# Patient Record
Sex: Female | Born: 1990 | Race: White | Hispanic: No | Smoking: Current every day smoker
Health system: Southern US, Community
[De-identification: ages and names within clinical notes are randomized; demographics above are authoritative.]

## PROBLEM LIST (undated history)

## (undated) ENCOUNTER — Inpatient Hospital Stay (HOSPITAL_COMMUNITY): Payer: Self-pay

## (undated) DIAGNOSIS — F1910 Other psychoactive substance abuse, uncomplicated: Secondary | ICD-10-CM

## (undated) DIAGNOSIS — B999 Unspecified infectious disease: Secondary | ICD-10-CM

## (undated) DIAGNOSIS — B1920 Unspecified viral hepatitis C without hepatic coma: Secondary | ICD-10-CM

## (undated) DIAGNOSIS — R51 Headache: Secondary | ICD-10-CM

## (undated) DIAGNOSIS — O24419 Gestational diabetes mellitus in pregnancy, unspecified control: Secondary | ICD-10-CM

## (undated) DIAGNOSIS — R519 Headache, unspecified: Secondary | ICD-10-CM

## (undated) DIAGNOSIS — F329 Major depressive disorder, single episode, unspecified: Secondary | ICD-10-CM

## (undated) DIAGNOSIS — F32A Depression, unspecified: Secondary | ICD-10-CM

## (undated) HISTORY — DX: Unspecified viral hepatitis C without hepatic coma: B19.20

## (undated) HISTORY — DX: Headache, unspecified: R51.9

## (undated) HISTORY — DX: Gestational diabetes mellitus in pregnancy, unspecified control: O24.419

## (undated) HISTORY — DX: Headache: R51

---

## 2012-07-23 HISTORY — PX: CHOLECYSTECTOMY, LAPAROSCOPIC: SHX56

## 2016-01-17 ENCOUNTER — Inpatient Hospital Stay
Admission: RE | Admit: 2016-01-17 | Discharge: 2016-01-23 | DRG: 885 | Disposition: A | Discharge status: Home / Self Care | Payer: Medicaid Other | Source: Intra-hospital | Attending: Psychiatry | Admitting: Psychiatry

## 2016-01-17 ENCOUNTER — Inpatient Hospital Stay
Admission: EM | Admit: 2016-01-17 | Payer: No Typology Code available for payment source | Source: Intra-hospital | Admitting: Psychiatry

## 2016-01-17 ENCOUNTER — Emergency Department (HOSPITAL_COMMUNITY)
Admission: EM | Admit: 2016-01-17 | Discharge: 2016-01-17 | Disposition: A | Discharge status: Other Acute Inpatient Hospital | Payer: Medicaid Other | Attending: Emergency Medicine | Admitting: Emergency Medicine

## 2016-01-17 ENCOUNTER — Encounter (HOSPITAL_COMMUNITY): Payer: Self-pay

## 2016-01-17 DIAGNOSIS — S00511A Abrasion of lip, initial encounter: Secondary | ICD-10-CM | POA: Diagnosis not present

## 2016-01-17 DIAGNOSIS — Z590 Homelessness: Secondary | ICD-10-CM | POA: Diagnosis not present

## 2016-01-17 DIAGNOSIS — Y939 Activity, unspecified: Secondary | ICD-10-CM | POA: Diagnosis not present

## 2016-01-17 DIAGNOSIS — O99341 Other mental disorders complicating pregnancy, first trimester: Secondary | ICD-10-CM | POA: Diagnosis present

## 2016-01-17 DIAGNOSIS — F1290 Cannabis use, unspecified, uncomplicated: Secondary | ICD-10-CM | POA: Diagnosis present

## 2016-01-17 DIAGNOSIS — O99331 Smoking (tobacco) complicating pregnancy, first trimester: Secondary | ICD-10-CM | POA: Insufficient documentation

## 2016-01-17 DIAGNOSIS — F329 Major depressive disorder, single episode, unspecified: Secondary | ICD-10-CM | POA: Insufficient documentation

## 2016-01-17 DIAGNOSIS — S5011XA Contusion of right forearm, initial encounter: Secondary | ICD-10-CM | POA: Diagnosis not present

## 2016-01-17 DIAGNOSIS — S5012XA Contusion of left forearm, initial encounter: Secondary | ICD-10-CM | POA: Diagnosis not present

## 2016-01-17 DIAGNOSIS — O2341 Unspecified infection of urinary tract in pregnancy, first trimester: Secondary | ICD-10-CM | POA: Diagnosis present

## 2016-01-17 DIAGNOSIS — O99321 Drug use complicating pregnancy, first trimester: Secondary | ICD-10-CM | POA: Diagnosis present

## 2016-01-17 DIAGNOSIS — T43226A Underdosing of selective serotonin reuptake inhibitors, initial encounter: Secondary | ICD-10-CM | POA: Diagnosis present

## 2016-01-17 DIAGNOSIS — F1310 Sedative, hypnotic or anxiolytic abuse, uncomplicated: Secondary | ICD-10-CM | POA: Diagnosis present

## 2016-01-17 DIAGNOSIS — G4700 Insomnia, unspecified: Secondary | ICD-10-CM | POA: Diagnosis present

## 2016-01-17 DIAGNOSIS — O0001 Abdominal pregnancy with intrauterine pregnancy: Secondary | ICD-10-CM | POA: Diagnosis present

## 2016-01-17 DIAGNOSIS — Z91120 Patient's intentional underdosing of medication regimen due to financial hardship: Secondary | ICD-10-CM | POA: Diagnosis not present

## 2016-01-17 DIAGNOSIS — R45851 Suicidal ideations: Secondary | ICD-10-CM | POA: Insufficient documentation

## 2016-01-17 DIAGNOSIS — F332 Major depressive disorder, recurrent severe without psychotic features: Principal | ICD-10-CM | POA: Diagnosis present

## 2016-01-17 DIAGNOSIS — Z79899 Other long term (current) drug therapy: Secondary | ICD-10-CM | POA: Insufficient documentation

## 2016-01-17 DIAGNOSIS — F17210 Nicotine dependence, cigarettes, uncomplicated: Secondary | ICD-10-CM | POA: Insufficient documentation

## 2016-01-17 DIAGNOSIS — Y92009 Unspecified place in unspecified non-institutional (private) residence as the place of occurrence of the external cause: Secondary | ICD-10-CM | POA: Insufficient documentation

## 2016-01-17 DIAGNOSIS — F1121 Opioid dependence, in remission: Secondary | ICD-10-CM | POA: Diagnosis present

## 2016-01-17 DIAGNOSIS — Y999 Unspecified external cause status: Secondary | ICD-10-CM | POA: Insufficient documentation

## 2016-01-17 DIAGNOSIS — B852 Pediculosis, unspecified: Secondary | ICD-10-CM | POA: Diagnosis present

## 2016-01-17 DIAGNOSIS — O9A311 Physical abuse complicating pregnancy, first trimester: Secondary | ICD-10-CM | POA: Insufficient documentation

## 2016-01-17 DIAGNOSIS — Z3A11 11 weeks gestation of pregnancy: Secondary | ICD-10-CM | POA: Insufficient documentation

## 2016-01-17 DIAGNOSIS — F17200 Nicotine dependence, unspecified, uncomplicated: Secondary | ICD-10-CM | POA: Diagnosis present

## 2016-01-17 DIAGNOSIS — F1220 Cannabis dependence, uncomplicated: Secondary | ICD-10-CM | POA: Diagnosis present

## 2016-01-17 DIAGNOSIS — F32A Depression, unspecified: Secondary | ICD-10-CM

## 2016-01-17 HISTORY — DX: Major depressive disorder, single episode, unspecified: F32.9

## 2016-01-17 HISTORY — DX: Depression, unspecified: F32.A

## 2016-01-17 LAB — CBC
HCT: 40.6 % (ref 36.0–46.0)
HEMOGLOBIN: 13.9 g/dL (ref 12.0–15.0)
MCH: 30.6 pg (ref 26.0–34.0)
MCHC: 34.2 g/dL (ref 30.0–36.0)
MCV: 89.4 fL (ref 78.0–100.0)
Platelets: 218 10*3/uL (ref 150–400)
RBC: 4.54 MIL/uL (ref 3.87–5.11)
RDW: 12 % (ref 11.5–15.5)
WBC: 7.5 10*3/uL (ref 4.0–10.5)

## 2016-01-17 LAB — COMPREHENSIVE METABOLIC PANEL
ALK PHOS: 45 U/L (ref 38–126)
ALT: 124 U/L — ABNORMAL HIGH (ref 14–54)
AST: 56 U/L — AB (ref 15–41)
Albumin: 3.6 g/dL (ref 3.5–5.0)
Anion gap: 9 (ref 5–15)
BUN: 6 mg/dL (ref 6–20)
CALCIUM: 9.8 mg/dL (ref 8.9–10.3)
CHLORIDE: 105 mmol/L (ref 101–111)
CO2: 21 mmol/L — AB (ref 22–32)
CREATININE: 0.71 mg/dL (ref 0.44–1.00)
Glucose, Bld: 170 mg/dL — ABNORMAL HIGH (ref 65–99)
Potassium: 3.5 mmol/L (ref 3.5–5.1)
SODIUM: 135 mmol/L (ref 135–145)
Total Bilirubin: 0.3 mg/dL (ref 0.3–1.2)
Total Protein: 6.7 g/dL (ref 6.5–8.1)

## 2016-01-17 LAB — RAPID URINE DRUG SCREEN, HOSP PERFORMED
AMPHETAMINES: NOT DETECTED
BARBITURATES: POSITIVE — AB
Benzodiazepines: NOT DETECTED
Cocaine: NOT DETECTED
OPIATES: NOT DETECTED
TETRAHYDROCANNABINOL: POSITIVE — AB

## 2016-01-17 LAB — SALICYLATE LEVEL

## 2016-01-17 LAB — HCG, QUANTITATIVE, PREGNANCY: HCG, BETA CHAIN, QUANT, S: 74542 m[IU]/mL — AB (ref <5)

## 2016-01-17 LAB — ACETAMINOPHEN LEVEL: Acetaminophen (Tylenol), Serum: <10 ug/mL — ABNORMAL LOW (ref 10–30)

## 2016-01-17 LAB — I-STAT BETA HCG BLOOD, ED (MC, WL, AP ONLY)

## 2016-01-17 LAB — ETHANOL: Alcohol, Ethyl (B): <5 mg/dL (ref <5)

## 2016-01-17 MED ORDER — MAGNESIUM HYDROXIDE 400 MG/5ML PO SUSP: 30.0000 mL | Freq: Every day | ORAL | Status: DC | PRN

## 2016-01-17 MED ORDER — PRENATAL MULTIVITAMIN CH
1.0000 | ORAL_TABLET | Freq: Every day | ORAL | Status: DC
  Administered 2016-01-18 – 2016-01-23 (×6): 1 via ORAL
  Filled 2016-01-17 (×11): qty 1

## 2016-01-17 MED ORDER — DIPHENHYDRAMINE HCL 25 MG PO CAPS
50.0000 mg | ORAL_CAPSULE | Freq: Every evening | ORAL | Status: DC | PRN
  Administered 2016-01-20 – 2016-01-22 (×2): 50 mg via ORAL
  Filled 2016-01-17 (×2): qty 2

## 2016-01-17 MED ORDER — ACETAMINOPHEN 325 MG PO TABS
650.0000 mg | ORAL_TABLET | Freq: Four times a day (QID) | ORAL | Status: DC | PRN
Start: 2016-01-17 — End: 2016-01-23
  Administered 2016-01-18 – 2016-01-22 (×4): 650 mg via ORAL
  Filled 2016-01-17 (×4): qty 2

## 2016-01-17 MED ORDER — NICOTINE 21 MG/24HR TD PT24
21.0000 mg | MEDICATED_PATCH | Freq: Every day | TRANSDERMAL | Status: DC
  Administered 2016-01-17 – 2016-01-21 (×5): 21 mg via TRANSDERMAL
  Filled 2016-01-17 (×5): qty 1

## 2016-01-17 MED ORDER — PRENATAL 27-0.8 MG PO TABS
1.0000 | ORAL_TABLET | Freq: Every day | ORAL | Status: DC
  Administered 2016-01-17: 1 via ORAL
  Filled 2016-01-17: qty 1

## 2016-01-17 MED ORDER — ALUM & MAG HYDROXIDE-SIMETH 200-200-20 MG/5ML PO SUSP: 30.0000 mL | ORAL | Status: DC | PRN

## 2016-01-17 MED ORDER — ONDANSETRON HCL 4 MG PO TABS: 4.0000 mg | ORAL_TABLET | Freq: Two times a day (BID) | ORAL | Status: DC | PRN

## 2016-01-17 MED ORDER — ACETAMINOPHEN 325 MG PO TABS: 650.0000 mg | ORAL_TABLET | Freq: Four times a day (QID) | ORAL | Status: DC | PRN

## 2016-01-17 NOTE — ED Notes (Signed)
[facility] contacted to transport patient to [facility]

## 2016-01-17 NOTE — ED Provider Notes (Signed)
25yo female with hx of [redacted]wk pregnant who present with SI. Medically cleared. Pt accepted to [facility] for continued care. Stable prior to time of transfer.   Alvira MondayErin Madalyn Legner, MD 01/19/16 [phone]
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
## 2016-01-17 NOTE — Progress Notes (Signed)
Pt referred [facility] per Virgina OrganLaurie Gardener, intake coordinator. Also [facility] and [facility] upon appropriate bed availability.  Ilean SkillMeghan Lailyn Appelbaum, MSW, LCSW Clinical Social Work, Disposition  01/17/2016 [id number]
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
## 2016-01-17 NOTE — Progress Notes (Signed)
Pt being reviewed for possible [facility]. H&P and Assessment [facility] for the charge nurse to review .     Cheryl FlashNicole Jailyne Chieffo, MS, NCC, LPCA Therapeutic Triage Specialist

## 2016-01-17 NOTE — Progress Notes (Signed)
[facility]  Attending Physician will be Dr. Ardyth HarpsHernandez.   Patient has been [facility] to room 301, [facility] Charge Nurse Gwen.   Intake Paper Work has been signed and placed on patient chart.  ER staff is aware of the admission ( Patient Access).   Please transport after 7pm  01/17/2016 Cheryl FlashNicole Dalan Cowger, MS, NCC, LPCA Therapeutic Triage Specialist

## 2016-01-17 NOTE — ED Notes (Signed)
Ordered diet tray 

## 2016-01-17 NOTE — Progress Notes (Signed)
Pt accepted to [facility]- attending Dr. Ardyth HarpsHernandez. Can arrive 19:00. Report can be called at [phone], and voluntary consents once signed by pt can be faxed to [facility] at [phone].  Ilean SkillMeghan Hillis Mcphatter, MSW, LCSW Clinical Social Work, Disposition  01/17/2016 [phone]

## 2016-01-18 DIAGNOSIS — F17200 Nicotine dependence, unspecified, uncomplicated: Secondary | ICD-10-CM | POA: Diagnosis present

## 2016-01-18 DIAGNOSIS — F1220 Cannabis dependence, uncomplicated: Secondary | ICD-10-CM | POA: Diagnosis present

## 2016-01-18 DIAGNOSIS — F1121 Opioid dependence, in remission: Secondary | ICD-10-CM | POA: Diagnosis present

## 2016-01-18 DIAGNOSIS — F1310 Sedative, hypnotic or anxiolytic abuse, uncomplicated: Secondary | ICD-10-CM | POA: Diagnosis present

## 2016-01-18 DIAGNOSIS — O0001 Abdominal pregnancy with intrauterine pregnancy: Secondary | ICD-10-CM | POA: Diagnosis present

## 2016-01-18 DIAGNOSIS — F332 Major depressive disorder, recurrent severe without psychotic features: Principal | ICD-10-CM

## 2016-01-18 MED ORDER — FOLIC ACID 1 MG PO TABS
2.0000 mg | ORAL_TABLET | Freq: Every day | ORAL | Status: DC
  Administered 2016-01-18 – 2016-01-23 (×6): 2 mg via ORAL
  Filled 2016-01-18 (×7): qty 2

## 2016-01-18 MED ORDER — PYRETHRINS-PIPERONYL BUTOXIDE 0.33-4 % EX SHAM
MEDICATED_SHAMPOO | Freq: Once | CUTANEOUS | Status: AC
  Administered 2016-01-18: 22:00:00 via TOPICAL
  Filled 2016-01-18: qty 118

## 2016-01-18 MED ORDER — FLUOXETINE HCL 20 MG PO CAPS
20.0000 mg | ORAL_CAPSULE | Freq: Every day | ORAL | Status: DC
  Administered 2016-01-18 – 2016-01-22 (×5): 20 mg via ORAL
  Filled 2016-01-18 (×5): qty 1

## 2016-01-18 MED ORDER — ONDANSETRON HCL 4 MG PO TABS
4.0000 mg | ORAL_TABLET | Freq: Three times a day (TID) | ORAL | Status: DC | PRN
  Administered 2016-01-18 – 2016-01-22 (×4): 4 mg via ORAL
  Filled 2016-01-18 (×4): qty 1

## 2016-01-18 MED ORDER — TUBERCULIN PPD 5 UNIT/0.1ML ID SOLN
5.0000 [IU] | Freq: Once | INTRADERMAL | Status: AC
  Administered 2016-01-18: 5 [IU] via INTRADERMAL
  Filled 2016-01-18: qty 0.1

## 2016-01-18 MED ORDER — TRAZODONE HCL 100 MG PO TABS
100.0000 mg | ORAL_TABLET | Freq: Every day | ORAL | Status: DC
  Administered 2016-01-18 – 2016-01-22 (×5): 100 mg via ORAL
  Filled 2016-01-18 (×7): qty 1

## 2016-01-18 NOTE — BHH Suicide Risk Assessment (Signed)
Cheryl Holt Admission Suicide Risk Assessment   Nursing information obtained from:  Patient Demographic factors:  Adolescent or young adult, Caucasian, Low socioeconomic status, Unemployed Current Mental Status:  NA Loss Factors:  Financial problems / change in socioeconomic status Historical Factors:  NA Risk Reduction Factors:  Pregnancy, Sense of responsibility to family, Positive coping skills or problem solving skills  Total Time spent with patient: 1 hour Principal Problem: Major depressive disorder, recurrent severe without psychotic features (HCC) Diagnosis:   Patient Active Problem List   Diagnosis Date Noted  . Abdominal pregnancy with intrauterine pregnancy [O00.01] 01/18/2016  . Cannabis use disorder, moderate, dependence (HCC) [F12.20] 01/18/2016  . Sedative, hypnotic or anxiolytic use disorder, mild, abuse [F13.10] 01/18/2016  . Tobacco use disorder [F17.200] 01/18/2016  . Major depressive disorder, recurrent severe without psychotic features (HCC) [F33.2] 01/17/2016   Subjective Data: Depression, anxiety, suicidal ideation.  Continued Clinical Symptoms:  Alcohol Use Disorder Identification Test Final Score (AUDIT): 0 The "Alcohol Use Disorders Identification Test", Guidelines for Use in Primary Care, Second Edition.  World Science writerHealth Organization [facility](WHO). Score between 0-7:  no or low risk or alcohol related problems. Score between 8-15:  moderate risk of alcohol related problems. Score between 16-19:  high risk of alcohol related problems. Score 20 or above:  warrants further diagnostic evaluation for alcohol dependence and treatment.   CLINICAL FACTORS:   Severe Anxiety and/or Agitation Depression:   Comorbid alcohol abuse/dependence Impulsivity Insomnia Alcohol/Substance Abuse/Dependencies   Musculoskeletal: Strength & Muscle Tone: within normal limits Gait & Station: normal Patient leans: N/A  Psychiatric Specialty Exam: Physical Exam  Nursing note and vitals  reviewed.   Review of Systems  Psychiatric/Behavioral: Positive for depression and suicidal ideas. The patient is nervous/anxious and has insomnia.   All other systems reviewed and are negative.   Blood pressure 116/67, pulse 82, temperature 98.2 F (36.8 C), temperature source Oral, resp. rate 16, height 5\' 5"  (1.651 m), weight 108.863 kg (240 lb), last menstrual period 10/23/2015, SpO2 99 %.Body mass index is 39.94 kg/(m^2).  General Appearance: Disheveled  Eye Contact:  Good  Speech:  Clear and Coherent  Volume:  Normal  Mood:  Depressed, Hopeless and Worthless  Affect:  Appropriate  Thought Process:  Goal Directed  Orientation:  Full (Time, Place, and Person)  Thought Content:  WDL  Suicidal Thoughts:  Yes.  with intent/plan  Homicidal Thoughts:  No  Memory:  Immediate;   Fair Recent;   Fair Remote;   Fair  Judgement:  Poor  Insight:  Lacking  Psychomotor Activity:  Normal  Concentration:  Concentration: Fair and Attention Span: Fair  Recall:  FiservFair  Fund of Knowledge:  Fair  Language:  Fair  Akathisia:  No  Handed:  Right  AIMS (if indicated):     Assets:  Communication Skills Desire for Improvement Financial Resources/Insurance Physical Health Resilience Social Support  ADL's:  Intact  Cognition:  WNL  Sleep:  Number of Hours: 6      COGNITIVE FEATURES THAT CONTRIBUTE TO RISK:  None    SUICIDE RISK:   Moderate:  Frequent suicidal ideation with limited intensity, and duration, some specificity in terms of plans, no associated intent, good self-control, limited dysphoria/symptomatology, some risk factors present, and identifiable protective factors, including available and accessible social support.  PLAN OF CARE: Hospital admission, medication management, substance abuse counseling, discharge planning.  Cheryl Holt is a 25 year old pregnant female with a history of depression, anxiety, suicide attempts, and substance abuse admitted to the hospital  for suicidal  ideation with the context of treatment noncompliance and severe social stressors.  1. Suicidal ideation. The patient is able to contract for safety in the hospital.   2. Mood. She did well on Prozac in the past and could not tolerate Zoloft. We will restart Prozac for depression.   3. Insomnia. We will offer trazodone.  4. Pregnancy. We will offer prenatal vitamins and and folic acid. We will offer Zofran for nausea. The patient [facility]. We will continue hCG level monitoring.  5. Substance abuse. The patient was positive for cannabis and barbiturates. She denies barbiturate abuse. We will monitor for symptoms of withdrawal.   6. Smoking. Nicotine patch is available.  7. Disposition. To be established. The patient is homeless.   I certify that inpatient services furnished can reasonably be expected to improve the patient's condition.   Kristine LineaJolanta Takiyah Bohnsack, MD 01/18/2016, 11:41 AM

## 2016-01-18 NOTE — Plan of Care (Signed)
Problem: Coping: Goal: Ability to verbalize frustrations and anger appropriately will improve Outcome: Progressing Developing coping skills     

## 2016-01-18 NOTE — Tx Team (Signed)
Initial Interdisciplinary Treatment Plan   PATIENT STRESSORS: Financial difficulties Marital or family conflict Medication change or noncompliance Substance abuse   PATIENT STRENGTHS: Active sense of humor Capable of independent living Communication skills Motivation for treatment/growth   PROBLEM LIST: Problem List/Patient Goals Date to be addressed Date deferred Reason deferred Estimated date of resolution  Depression             Suicidal ideation             Financial difficulties             Substance abuse.                    DISCHARGE CRITERIA:  Adequate post-discharge living arrangements Improved stabilization in mood, thinking, and/or behavior Motivation to continue treatment in a less acute level of care  PRELIMINARY DISCHARGE PLAN: Outpatient therapy Participate in family therapy Placement in alternative living arrangements  PATIENT/FAMIILY INVOLVEMENT: This treatment plan has been presented to and reviewed with the patient, Cheryl RunnerBethany Holt,The patient and family have been given the opportunity to ask questions and make suggestions.  Cheryl Holt 01/18/2016, 4:43 AM
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
## 2016-01-18 NOTE — Plan of Care (Signed)
Problem: Coping: Goal: Ability to verbalize feelings will improve Outcome: Progressing Patient verbalized concerns and feeling to staff.

## 2016-01-18 NOTE — BHH Group Notes (Signed)
[facility] LCSW Group Therapy   01/18/2016  9:30am   Type of Therapy: Group Therapy   Participation Level: Invited but did not attend.  Participation Quality: Invited but did not attend.   Lynden OxfordKadijah R. Shalla Bulluck, MSW, Theresia MajorsLCSWA
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
## 2016-01-18 NOTE — Progress Notes (Signed)
Recreation Therapy Notes  Date: 06.28.17 Time: 1:00 pm Location: Craft Room  Group Topic: Self-esteem  Goal Area(s) Addresses:  Patient will identify positive traits about self. Patient will verbalize benefit of having a healthy self-esteem.  Behavioral Response: Did not attend  Intervention: I Am  Activity: Patients were given a worksheet with the letter I on it and instructed to write as many positive traits inside the letter.  Education: LRT educated patients on ways they can increase their self-esteem.  Education Outcome: Patient did not attend group.  Clinical Observations/Feedback: Patient did not attend group.  Jacquelynn CreeGreene,Kamron Portee M, LRT/CTRS 01/18/2016 4:03 PM

## 2016-01-18 NOTE — BHH Group Notes (Signed)
[facility] Group Notes:  (Nursing/MHT/Case Management/Adjunct)  Date:  01/18/2016  Time:  4:09 PM  Type of Therapy:  Psychoeducational Skills  Participation Level:  Active  Participation Quality:  Appropriate, Attentive and Sharing  Affect:  Appropriate  Cognitive:  Alert and Appropriate  Insight:  Appropriate  Engagement in Group:  Engaged  Modes of Intervention:  Discussion, Education and Support  Summary of Progress/Problems:  Lynelle SmokeCara Travis Bowdle HealthcareMadoni 01/18/2016, 4:09 PM

## 2016-01-18 NOTE — Progress Notes (Signed)
D: Patient stated slept good last night .Stated appetite is good and energy level  Is normal. Stated concentration is good . Stated on Depression scale , hopeless and anxiety .( low 0-10 high) Denies suicidal  homicidal ideations  .  No auditory hallucinations  No pain concerns . Appropriate ADL'S. Interacting with peers and staff. Patient voice of something crawling in her  Head . Writer  Checked   Head lice noted . Patient stated her  Niece has them.  A: Encourage patient participation with unit programming . Instruction  Given on  Medication , verbalize understanding. MD called  Order obtained  For medication R: Voice no other concerns. Staff continue to monitor
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
## 2016-01-19 NOTE — Plan of Care (Signed)
Problem: Coping: Goal: Ability to verbalize feelings will improve Outcome: Progressing Patient able to verbalize feelings to staff.

## 2016-01-19 NOTE — Progress Notes (Signed)
D: Pt denies SI/HI/AVH. Pt is pleasant and cooperative, affect  Is flat and sad, but brightens upon approach. Patient is been treated for  head lice. Lice killing shampoo applied to head, and patient reported good relief. Patient was placed on isolation in her room  Pt stated she feels better from taking her medication, Patient appears less anxious, A: Pt was offered support and encouragement. Pt was given scheduled medications.15 minute checks were done for safety.  R:Pt is taking medication. .Pt receptive to treatment and safety maintained on unit.

## 2016-01-19 NOTE — [facility] Group Notes (Signed)
[facility] Group Notes:  (Nursing/MHT/Case Management/Adjunct)  Date:  01/19/2016  Time:  4:20 AM  Type of Therapy:  Group Therapy  Participation Level:  Did Not Attend   Summary of Progress/Problems:  Cheryl Holt Cheryl Holt 01/19/2016, 4:20 AM

## 2016-01-19 NOTE — Progress Notes (Signed)
D: Patient stated slept fair last night .Stated appetite is good and energy level  Low. Stated concentration is good . Stated on Depression scale 8, hopeless 8 and anxiety 6 .( low 0-10 high) Denies suicidal  homicidal ideations  .  No auditory hallucinations  No pain concerns . Appropriate ADL'S. Interacting with peers and staff.  Learn how to be more independent . Patient  Stated  She [facility] . Stated she could stay there for up 15 months  And they will help with finding placement A: Encourage patient participation with unit programming . Instruction  Given on  Medication , verbalize understanding. R: Voice no other concerns. Staff continue to monitor

## 2016-01-19 NOTE — Plan of Care (Signed)
Problem: Coping: Goal: Ability to verbalize frustrations and anger appropriately will improve Outcome: Progressing Working on coping skills    

## 2016-01-19 NOTE — Progress Notes (Signed)
D: Pt denies SI/HI/AVH. Pt is pleasant and cooperative, affect is much brighter, S/P; 24 hours isolation for lice infestation in her hair, upon assessment no lice eggs found, patient denies itching or crawling sensation. Pt stated she feels better after the shampoo administration. Patient later allowed to come out of her room and interact with peers in the dayroom. Patient appears less anxious and she is interacting with peers and staff appropriately.  A: Pt was offered support and encouragement. Pt was given scheduled medications. Pt was encouraged to attend groups. Q 15 minute checks were done for safety.  R:Pt attends groups and interacts well with peers and staff. Pt is taking medication. Pt has no complaints.Pt receptive to treatment and safety maintained on unit.

## 2016-01-19 NOTE — Progress Notes (Signed)
[facility] MD Progress Note  01/19/2016 2:22 PM Kristyl Athens  MRN:  [id number]  Subjective:  Ms. Kovacich still feels depressed and suicidal but she did complete application for [facility], a program for homeless, pregnant women with substance abuse. Apparently they have a bed there. PPD placed. She accepts medications and tolerates them well.She complains of nausea. Some group participation. The patient was infested with lice. Treatment completed.  Principal Problem: Major depressive disorder, recurrent severe without psychotic features (HCC) Diagnosis:   Patient Active Problem List   Diagnosis Date Noted  . Abdominal pregnancy with intrauterine pregnancy [O00.01] 01/18/2016  . Cannabis use disorder, moderate, dependence (HCC) [F12.20] 01/18/2016  . Sedative, hypnotic or anxiolytic use disorder, mild, abuse [F13.10] 01/18/2016  . Tobacco use disorder [F17.200] 01/18/2016  . Opioid use disorder, severe, in sustained remission [F11.90] 01/18/2016  . Major depressive disorder, recurrent severe without psychotic features (HCC) [F33.2] 01/17/2016   Total Time spent with patient: 20 minutes  Past Psychiatric History: depression, anxiety, substance abuse.  Past Medical History:  Past Medical History  Diagnosis Date  . Depression    History reviewed. No pertinent past surgical history. Family History: History reviewed. No pertinent family history. Family Psychiatric  History: see H&P Social History:  History  Alcohol Use No     History  Drug Use  . Yes    Social History   Social History  . Marital Status: Legally Separated    Spouse Name: N/A  . Number of Children: N/A  . Years of Education: N/A   Social History Main Topics  . Smoking status: Current Every Day Smoker -- 0.50 packs/day for 10 years    Types: Cigarettes  . Smokeless tobacco: None  . Alcohol Use: No  . Drug Use: Yes  . Sexual Activity: Yes    Birth Control/ Protection: Coitus interruptus   Other Topics Concern  .  None   Social History Narrative   Additional Social History:                         Sleep: Fair  Appetite:  Good  Current Medications: Current Facility-Administered Medications  Medication Dose Route Frequency Provider Last Rate Last Dose  . acetaminophen (TYLENOL) tablet 650 mg  650 mg Oral Q6H PRN Audery Amel, MD   650 mg at 01/18/16 4098  . alum & mag hydroxide-simeth (MAALOX/MYLANTA) 200-200-20 MG/5ML suspension 30 mL  30 mL Oral Q4H PRN Audery Amel, MD      . diphenhydrAMINE (BENADRYL) capsule 50 mg  50 mg Oral QHS PRN Audery Amel, MD      . FLUoxetine (PROZAC) capsule 20 mg  20 mg Oral Daily Emmett Bracknell B Carisa Backhaus, MD   20 mg at 01/19/16 0905  . folic acid (FOLVITE) tablet 2 mg  2 mg Oral Q lunch Leotis Isham B Dannell Gortney, MD   2 mg at 01/19/16 1151  . magnesium hydroxide (MILK OF MAGNESIA) suspension 30 mL  30 mL Oral Daily PRN Audery Amel, MD      . nicotine (NICODERM CQ - dosed in mg/24 hours) patch 21 mg  21 mg Transdermal Daily Audery Amel, MD   21 mg at 01/19/16 0906  . ondansetron (ZOFRAN) tablet 4 mg  4 mg Oral Q8H PRN Shari Prows, MD   4 mg at 01/18/16 1218  . prenatal multivitamin tablet 1 tablet  1 tablet Oral Q1200 Audery Amel, MD   1 tablet at 01/19/16 1153  .  traZODone (DESYREL) tablet 100 mg  100 mg Oral QHS Louine Tenpenny B Moreen Piggott, MD   100 mg at 01/18/16 2250  . tuberculin injection 5 Units  5 Units Intradermal Once Shari ProwsJolanta B Jerrye Seebeck, MD   5 Units at 01/18/16 1704    Lab Results: No results found for this or any previous visit (from the past 48 hour(s)).  Blood Alcohol level:  Lab Results  Component Value Date   ETH <5 01/17/2016    Metabolic Disorder Labs: No results found for: HGBA1C, MPG No results found for: PROLACTIN No results found for: CHOL, TRIG, HDL, CHOLHDL, VLDL, LDLCALC  Physical Findings: AIMS: Facial and Oral Movements Muscles of Facial Expression: None, normal Lips and Perioral Area: None, normal Jaw:  None, normal Tongue: None, normal,Extremity Movements Upper (arms, wrists, hands, fingers): None, normal Lower (legs, knees, ankles, toes): None, normal, Trunk Movements Neck, shoulders, hips: None, normal,  , Dental Status Current problems with teeth and/or dentures?: No Does patient usually wear dentures?: No  CIWA:    COWS:  COWS Total Score: 1  Musculoskeletal: Strength & Muscle Tone: within normal limits Gait & Station: normal Patient leans: N/A  Psychiatric Specialty Exam: Physical Exam  Nursing note and vitals reviewed.   Review of Systems  Gastrointestinal: Positive for nausea.  Psychiatric/Behavioral: Positive for depression, suicidal ideas and substance abuse.  All other systems reviewed and are negative.   Blood pressure 114/65, pulse 81, temperature 98.2 F (36.8 C), temperature source Oral, resp. rate 16, height 5\' 5"  (1.651 m), weight 108.863 kg (240 lb), last menstrual period 10/23/2015, SpO2 99 %.Body mass index is 39.94 kg/(m^2).  General Appearance: Disheveled  Eye Contact:  Good  Speech:  Clear and Coherent  Volume:  Normal  Mood:  Anxious and Depressed  Affect:  Appropriate  Thought Process:  Goal Directed  Orientation:  Full (Time, Place, and Person)  Thought Content:  WDL  Suicidal Thoughts:  Yes.  with intent/plan  Homicidal Thoughts:  No  Memory:  Immediate;   Fair Recent;   Fair Remote;   Fair  Judgement:  Poor  Insight:  Lacking  Psychomotor Activity:  Psychomotor Retardation  Concentration:  Concentration: Fair and Attention Span: Fair  Recall:  FiservFair  Fund of Knowledge:  Fair  Language:  Fair  Akathisia:  No  Handed:  Right  AIMS (if indicated):     Assets:  Communication Skills Desire for Improvement Physical Health Resilience  ADL's:  Intact  Cognition:  WNL  Sleep:  Number of Hours: 5.15     Treatment Plan Summary: Daily contact with patient to assess and evaluate symptoms and progress in treatment and Medication management    [name] is a 25 year old pregnant female with a history of depression, anxiety, suicide attempts, and substance abuse admitted to the hospital for suicidal ideation with the context of treatment noncompliance and severe social stressors.  1. Suicidal ideation. The patient is able to contract for safety in the hospital.   2. Mood. She did well on Prozac in the past and could not tolerate Zoloft. We will restart Prozac for depression.   3. Insomnia. We offered trazodone.  4. Pregnancy. We started prenatal vitamins and and folic acid. We started Zofran for nausea. The patient had [facility]. We will continue hCG level monitoring.  5. Substance abuse. The patient was positive for cannabis and barbiturates. She denies barbiturate abuse. We will monitor for symptoms of withdrawal.   6. Smoking. Nicotine patch is available.  7. Disposition. To be established. The patient is homeless.  Kristine LineaJolanta Khyler Eschmann, MD 01/19/2016, 2:22 PM

## 2016-01-19 NOTE — Progress Notes (Signed)
Recreation Therapy Notes  At approximately 11:35 am, LRT attempted treatment session. Patient reported she felt sick and requested for LRT to come back at a later time.  Jacquelynn CreeGreene,Shantele Reller M, LRT/CTRS 01/19/2016 11:39 AM

## 2016-01-19 NOTE — [facility] Group Notes (Signed)
[facility] LCSW Group Therapy   01/19/2016 9:00 am  Type of Therapy: Group Therapy   Participation Level: Did Not Attend. Patient invited to participate but declined.    Jefferie Holston F. Elim Economou, MSW, LCSWA, LCAS  01/19/16   

## 2016-01-19 NOTE — Progress Notes (Signed)
Recreation Therapy Notes  Date: 06.29.17 Time: 1:00 pm Location: Craft Room  Group Topic: Leisure Education  Goal Area(s) Addresses:  Patient will identify activities for each letter of the alphabet. Patient will verbalize ability to integrate positive leisure into life post d/c. Patient will verbalize ability to use leisure as a Associate Professorcoping skill.  Behavioral Response: Did not attend  Intervention: Leisure Alphabet  Activity: Patients were given a Leisure Information systems managerAlphabet worksheet and instructed to identify a leisure activity for each letter of the alphabet.  Education: LRT educated patient on what they need to participate in leisure.  Education Outcome: Patient did not attend group.   Clinical Observations/Feedback: Patient did not attend group.  Jacquelynn CreeGreene,Claudia Alvizo M, LRT/CTRS 01/19/2016 2:46 PM

## 2016-01-20 LAB — HCG, QUANTITATIVE, PREGNANCY: HCG, BETA CHAIN, QUANT, S: 66496 m[IU]/mL — AB (ref <5)

## 2016-01-20 LAB — URINALYSIS COMPLETE WITH MICROSCOPIC (ARMC ONLY)
BILIRUBIN URINE: NEGATIVE
Glucose, UA: 150 mg/dL — AB
Hgb urine dipstick: NEGATIVE
Ketones, ur: NEGATIVE mg/dL
NITRITE: NEGATIVE
Protein, ur: NEGATIVE mg/dL
SPECIFIC GRAVITY, URINE: 1.008 (ref 1.005–1.030)
pH: 7 (ref 5.0–8.0)

## 2016-01-20 MED ORDER — WHITE PETROLATUM EX OINT
1.0000 "application " | TOPICAL_OINTMENT | CUTANEOUS | Status: DC | PRN
  Filled 2016-01-20: qty 5

## 2016-01-20 MED ORDER — CIPROFLOXACIN HCL 500 MG PO TABS
250.0000 mg | ORAL_TABLET | Freq: Two times a day (BID) | ORAL | Status: AC
  Administered 2016-01-20 – 2016-01-23 (×6): 250 mg via ORAL
  Filled 2016-01-20: qty 1
  Filled 2016-01-20: qty 2
  Filled 2016-01-20 (×5): qty 1

## 2016-01-20 NOTE — Plan of Care (Signed)
Problem: [facility] Participation in Recreation Therapeutic Interventions Goal: STG-Patient will identify at least five coping skills for ** STG: Coping Skills - Within 4 treatment sessions, patient will verbalize at least 5 coping skills for substance abuse in each of 2 treatment sessions to decrease substance abuse post d/c.  Outcome: Progressing Treatment Session 1; Completed 1 out of 2: At approximately 12:05 pm, LRT met with patient in craft room. Patient verbalized 5 coping skills for substance abuse. LRT educated patient in leisure and why it is important to implement it into her schedule. LRT educated and provided patient with blank schedules to help her plan her day and try to avoid using substances. LRT educated patient on healthy support systems.  Leonette Monarch, LRT/CTRS 06.30.17 3:40 pm Goal: STG-Other Recreation Therapy Goal (Specify) STG: Stress Management - Within 4 treatment sessions, patient will verbalize understanding of the stress management techniques in each of 2 treatment sessions to increase stress management skills post d/c.  Outcome: Progressing Treatment Session 1; Completed 1 out of 2: At approximately 12:05 pm, LRT met with patient in craft room. LRT educated and provided patient with handouts on stress management techniques. Patient verbalized understanding. LRT encouraged patient to read over and practice the stress management techniques.  Leonette Monarch, LRT/CTRS 06.30.17 3:41 pm

## 2016-01-20 NOTE — Plan of Care (Signed)
Problem: Activity: Goal: Interest or engagement in activities will improve Outcome: Progressing Pt going to group and reports enjoying

## 2016-01-20 NOTE — Progress Notes (Signed)
Patient pleasant and cooperative with care. Denies HI, AVH but has passive suicidal thoughts. Pt verbally contracts for safety.Pt reports nausea before breakfast. Prn given with good relief. Pt noted going and participating in group. Socializing with peers. Appropriate with staff. Encouragement and support offered. Pt receptive and remains safe on unit with q 15 min checks.

## 2016-01-20 NOTE — [facility] Group Notes (Signed)
[facility] Group Notes:  (Nursing/MHT/Case Management/Adjunct)  Date:  01/20/2016  Time:  12:00 AM  Type of Therapy:  Psychoeducational Skills  Participation Level:  Active  Participation Quality:  Appropriate, Attentive, Sharing and Supportive  Affect:  Appropriate  Cognitive:  Alert, Appropriate and Oriented  Insight:  Good  Engagement in Group:  Engaged and Supportive  Modes of Intervention:  Discussion, Education and Exploration  Summary of Progress/Problems:  Cheryl Holt 01/20/2016, 12:00 AM

## 2016-01-20 NOTE — Progress Notes (Signed)
Recreation Therapy Notes  Date: 06.30.17 Time: 1:00 pm Location: Craft Room  Group Topic: Communication, Problem solving, Teamwork  Goal Area(s) Addresses:  Patient will effectively work with peer towards shared goal. Patient will identify skills used to make activity successful. Patient will identify benefit of using group skills effectively post d/c.  Behavioral Response: Did not attend  Intervention: [employer]Pipe Cleaner Tower  Activity: Patients were given 15 pipe cleaners and were instructed to build a free standing tower. Patients had 2 minutes to strategize. After about 5 minutes of building, patients were instructed to put their dominant hand behind their back. After another 5 minutes, patients were instructed to stop talking to each other.  Education: LRT educated patients on healthy support systems.  Education Outcome: Patient did not attend group.  Clinical Observations/Feedback: Patient did not attend group.  Jacquelynn CreeGreene,Evynn Boutelle M, LRT/CTRS 01/20/2016 3:24 PM

## 2016-01-20 NOTE — [facility] Group Notes (Signed)
[facility] LCSW Group Therapy   01/20/2016 9:30 AM   Type of Therapy: Group Therapy   Participation Level: Active   Participation Quality: Attentive, Sharing and Supportive   Affect: Appropriate   Cognitive: Alert and Oriented   Insight: Developing/Improving and Engaged   Engagement in Therapy: Developing/Improving and Engaged   Modes of Intervention: Clarification, Confrontation, Discussion, Education, Exploration, Limit-setting, Orientation, Problem-solving, Rapport Building, Dance movement psychotherapisteality Testing, Socialization and Support   Summary of Progress/Problems: The topic for today was feelings about relapse. Pt discussed what relapse prevention is to them and identified triggers that they are on the path to relapse. Pt processed their feeling towards relapse and was able to relate to peers. Pt discussed coping skills that can be used for relapse prevention. Pt expressed understanding of relapse and recovery process. She stated that her causes of relapse was due feelings of hopelessness and bitterness. Pt identified recovery as attending therapy, complying with medications, and having a good support system.    Cheryl OxfordKadijah R. Niccolas Loeper, MSW, Theresia MajorsLCSWA
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
## 2016-01-20 NOTE — [facility] Group Notes (Signed)
[facility] Group Notes:  (Nursing/MHT/Case Management/Adjunct)  Date:  01/20/2016  Time:  3:56 PM  Type of Therapy:  Relaxation Group   Participation Level:  Did Not Attend  Cheryl Holt 01/20/2016, 3:56 PM

## 2016-01-20 NOTE — Progress Notes (Addendum)
[facility] MD Progress Note  01/20/2016 11:50 AM Cheryl Holt  MRN:  [id number]  Subjective:   Cheryl Holt is a 25 year old pregnant femalewith a history of depression and substance abuse admitted for worsening of depression and suicidal ideation the context of poor social support and homelessness and substance abuse. She did not receive prenatal care except an ultrasound at [facility] indicating 9 week old fetus. She was started on Prozac that was helpful in the past. She submitted application to [facility] in [place] where they have a bed available. The patient still complains of depression and passing suicidal ideations.   Today, she complains of poor sleep, severe as 6 out of 10 depression and passing suicidal ideation. She has morning sickness and was unable to eat her breakfast. We ordered a sandwich. She denies any other physical symptoms. She tolerates medications well. Good group participation. Her hygiene remains very poor. We treated her lice already.  Principal Problem: Major depressive disorder, recurrent severe without psychotic features (HCC) Diagnosis:   Patient Active Problem List   Diagnosis Date Noted  . Abdominal pregnancy with intrauterine pregnancy [O00.01] 01/18/2016  . Cannabis use disorder, moderate, dependence (HCC) [F12.20] 01/18/2016  . Sedative, hypnotic or anxiolytic use disorder, mild, abuse [F13.10] 01/18/2016  . Tobacco use disorder [F17.200] 01/18/2016  . Opioid use disorder, severe, in sustained remission [F11.90] 01/18/2016  . Major depressive disorder, recurrent severe without psychotic features (HCC) [F33.2] 01/17/2016   Total Time spent with patient: 20 minutes  Past Psychiatric History: Depression, substance abuse.  Past Medical History:  Past Medical History  Diagnosis Date  . Depression    History reviewed. No pertinent past surgical history. Family History: History reviewed. No pertinent family history. Family Psychiatric  History: See  H&P. Social History:  History  Alcohol Use No     History  Drug Use  . Yes    Social History   Social History  . Marital Status: Legally Separated    Spouse Name: N/A  . Number of Children: N/A  . Years of Education: N/A   Social History Main Topics  . Smoking status: Current Every Day Smoker -- 0.50 packs/day for 10 years    Types: Cigarettes  . Smokeless tobacco: None  . Alcohol Use: No  . Drug Use: Yes  . Sexual Activity: Yes    Birth Control/ Protection: Coitus interruptus   Other Topics Concern  . None   Social History Narrative   Additional Social History:                         Sleep: Poor  Appetite:  Poor  Current Medications: Current Facility-Administered Medications  Medication Dose Route Frequency Provider Last Rate Last Dose  . acetaminophen (TYLENOL) tablet 650 mg  650 mg Oral Q6H PRN Audery Amel, MD   650 mg at 01/19/16 1516  . alum & mag hydroxide-simeth (MAALOX/MYLANTA) 200-200-20 MG/5ML suspension 30 mL  30 mL Oral Q4H PRN Audery Amel, MD      . diphenhydrAMINE (BENADRYL) capsule 50 mg  50 mg Oral QHS PRN Audery Amel, MD      . FLUoxetine (PROZAC) capsule 20 mg  20 mg Oral Daily Jahni Nazar B Collyns Mcquigg, MD   20 mg at 01/20/16 0815  . folic acid (FOLVITE) tablet 2 mg  2 mg Oral Q lunch Phat Dalton B Ileigh Mettler, MD   2 mg at 01/19/16 1151  . magnesium hydroxide (MILK OF MAGNESIA) suspension 30 mL  30 mL Oral Daily PRN Audery AmelJohn T Clapacs, MD      . nicotine (NICODERM CQ - dosed in mg/24 hours) patch 21 mg  21 mg Transdermal Daily Audery AmelJohn T Clapacs, MD   21 mg at 01/20/16 0815  . ondansetron (ZOFRAN) tablet 4 mg  4 mg Oral Q8H PRN Shari ProwsJolanta B Nolah Krenzer, MD   4 mg at 01/20/16 0815  . prenatal multivitamin tablet 1 tablet  1 tablet Oral Q1200 Audery AmelJohn T Clapacs, MD   1 tablet at 01/19/16 1153  . traZODone (DESYREL) tablet 100 mg  100 mg Oral QHS Shari ProwsJolanta B Filmore Molyneux, MD   100 mg at 01/19/16 2226  . tuberculin injection 5 Units  5 Units Intradermal Once  Shari ProwsJolanta B Ashon Rosenberg, MD   5 Units at 01/18/16 1704    Lab Results: No results found for this or any previous visit (from the past 48 hour(s)).  Blood Alcohol level:  Lab Results  Component Value Date   ETH <5 01/17/2016    Metabolic Disorder Labs: No results found for: HGBA1C, MPG No results found for: PROLACTIN No results found for: CHOL, TRIG, HDL, CHOLHDL, VLDL, LDLCALC  Physical Findings: AIMS: Facial and Oral Movements Muscles of Facial Expression: None, normal Lips and Perioral Area: None, normal Jaw: None, normal Tongue: None, normal,Extremity Movements Upper (arms, wrists, hands, fingers): None, normal Lower (legs, knees, ankles, toes): None, normal, Trunk Movements Neck, shoulders, hips: None, normal,  , Dental Status Current problems with teeth and/or dentures?: No Does patient usually wear dentures?: No  CIWA:    COWS:  COWS Total Score: 1  Musculoskeletal: Strength & Muscle Tone: within normal limits Gait & Station: normal Patient leans: N/A  Psychiatric Specialty Exam: Physical Exam  Nursing note and vitals reviewed.   Review of Systems  Psychiatric/Behavioral: Positive for depression, suicidal ideas and substance abuse.  All other systems reviewed and are negative.   Blood pressure 106/60, pulse 91, temperature 98.6 F (37 C), temperature source Oral, resp. rate 18, height 5\' 5"  (1.651 m), weight 108.863 kg (240 lb), last menstrual period 10/23/2015, SpO2 99 %.Body mass index is 39.94 kg/(m^2).  General Appearance: Disheveled  Eye Contact:  Good  Speech:  Clear and Coherent  Volume:  Normal  Mood:  Depressed, Hopeless and Worthless  Affect:  Blunt  Thought Process:  Goal Directed  Orientation:  Full (Time, Place, and Person)  Thought Content:  WDL  Suicidal Thoughts:  Yes.  with intent/plan  Homicidal Thoughts:  No  Memory:  Immediate;   Fair Recent;   Fair Remote;   Fair  Judgement:  Poor  Insight:  Lacking  Psychomotor Activity:  Normal   Concentration:  Concentration: Fair and Attention Span: Fair  Recall:  Fis[employer]Fair  Fund of Knowledge:  Fair  Language:  Fair  Akathisia:  No  Handed:  Right  AIMS (if indicated):     Assets:  Communication Skills Desire for Improvement Physical Health Resilience Social Support  ADL's:  Intact  Cognition:  WNL  Sleep:  Number of Hours: 6.5     Treatment Plan Summary: Daily contact with patient to assess and evaluate symptoms and progress in treatment and Medication management   [name] is a 25 year old pregnant female with a history of depression, anxiety, suicide attempts, and substance abuse admitted to the hospital for suicidal ideation with the context of treatment noncompliance and severe social stressors.  1. Suicidal ideation. The patient is able to contract for safety in the hospital.   2. Mood.  She did well on Prozac in the past and could not tolerate Zoloft. We will restart Prozac for depression.   3. Insomnia. We offered trazodone.  4. Pregnancy. We started prenatal vitamins, folic acid, and Zofran for nausea. The patient had ultrasound performed recently at [facility]. We will continue hCG level monitoring.  5. Substance abuse. The patient was positive for cannabis and barbiturates. She denies barbiturate abuse. There were no symptoms of withdrawal.   6. Smoking. Nicotine patch is available.  7. UTI. We started Cipro.    8. Disposition. The patient will likely be discharged to [facility] in [place]. PPD was placed. [facility] and [facility] pregnancy needs.   Kristine LineaJolanta Kinisha Soper, MD 01/20/2016, 11:50 AM

## 2016-01-21 MED ORDER — NICOTINE POLACRILEX 2 MG MT GUM
2.0000 mg | CHEWING_GUM | OROMUCOSAL | Status: DC | PRN
  Administered 2016-01-22 – 2016-01-23 (×4): 2 mg via ORAL
  Filled 2016-01-21 (×7): qty 1

## 2016-01-21 MED ORDER — DOCUSATE SODIUM 100 MG PO CAPS
100.0000 mg | ORAL_CAPSULE | Freq: Once | ORAL | Status: AC
  Administered 2016-01-22: 100 mg via ORAL
  Filled 2016-01-21: qty 1

## 2016-01-21 NOTE — Progress Notes (Signed)
[facility]  01/21/2016 8:28 AM Cheryl Holt  MRN:  [id number]  Subjective:   Cheryl Holt is a 25 year old pregnant femalewith a history of depression and substance abuse admitted for worsening of depression and suicidal ideation the context of poor social support and homelessness and substance abuse. She did not receive prenatal care except an ultrasound at [facility] indicating 89 week old fetus. She was started on Prozac that was helpful in the past. She submitted application to [facility] in [place] where they have a bed available. The patient still complains of depression and passing suicidal ideations.   Today,  She states she feels "ok" but depression is 7 out of 10 which she feels might be a little better than previously. She had poor/fair sleep and is tired today. She contracts for safety in the hospital because " I love my baby." Denies HI/AVH. Continues with poor hygiene. Appetite is improved and she requests more food.    Complains of morning sickness.      Principal Problem: Major depressive disorder, recurrent severe without psychotic features (HCC) Diagnosis:   Patient Active Problem List   Diagnosis Date Noted  . Abdominal pregnancy with intrauterine pregnancy [O00.01] 01/18/2016  . Cannabis use disorder, moderate, dependence (HCC) [F12.20] 01/18/2016  . Sedative, hypnotic or anxiolytic use disorder, mild, abuse [F13.10] 01/18/2016  . Tobacco use disorder [F17.200] 01/18/2016  . Opioid use disorder, severe, in sustained remission [F11.90] 01/18/2016  . Major depressive disorder, recurrent severe without psychotic features (HCC) [F33.2] 01/17/2016   Total Time spent with patient: 20 minutes  Past Psychiatric History: Depression, substance abuse.  Past Medical History:  Past Medical History  Diagnosis Date  . Depression    History reviewed. No pertinent past surgical history. Family History: History reviewed. No pertinent family history. Family  Psychiatric  History: See H&P. Social History:  History  Alcohol Use No     History  Drug Use  . Yes    Social History   Social History  . Marital Status: Legally Separated    Spouse Name: N/A  . Number of Children: N/A  . Years of Education: N/A   Social History Main Topics  . Smoking status: Current Every Day Smoker -- 0.50 packs/day for 10 years    Types: Cigarettes  . Smokeless tobacco: None  . Alcohol Use: No  . Drug Use: Yes  . Sexual Activity: Yes    Birth Control/ Protection: Coitus interruptus   Other Topics Concern  . None   Social History Narrative   Additional Social History:                         Sleep: Poor  Appetite:  Good  Current Medications: Current Facility-Administered Medications  Medication Dose Route Frequency Provider Last Rate Last Dose  . acetaminophen (TYLENOL) tablet 650 mg  650 mg Oral Q6H PRN Audery AmelJohn T Clapacs, MD   650 mg at 01/19/16 1516  . alum & mag hydroxide-simeth (MAALOX/MYLANTA) 200-200-20 MG/5ML suspension 30 mL  30 mL Oral Q4H PRN Audery AmelJohn T Clapacs, MD      . ciprofloxacin (CIPRO) tablet 250 mg  250 mg Oral BID Shari ProwsJolanta B Pucilowska, MD   250 mg at 01/20/16 2202  . diphenhydrAMINE (BENADRYL) capsule 50 mg  50 mg Oral QHS PRN Audery AmelJohn T Clapacs, MD   50 mg at 01/20/16 1211  . FLUoxetine (PROZAC) capsule 20 mg  20 mg Oral Daily Shari ProwsJolanta B Pucilowska, MD  20 mg at 01/20/16 0815  . folic acid (FOLVITE) tablet 2 mg  2 mg Oral Q lunch Jolanta B Pucilowska, MD   2 mg at 01/20/16 1211  . magnesium hydroxide (MILK OF MAGNESIA) suspension 30 mL  30 mL Oral Daily PRN Audery AmelJohn T Clapacs, MD      . nicotine (NICODERM CQ - dosed in mg/24 hours) patch 21 mg  21 mg Transdermal Daily Audery AmelJohn T Clapacs, MD   21 mg at 01/20/16 0815  . ondansetron (ZOFRAN) tablet 4 mg  4 mg Oral Q8H PRN Shari ProwsJolanta B Pucilowska, MD   4 mg at 01/20/16 0815  . prenatal multivitamin tablet 1 tablet  1 tablet Oral Q1200 Audery AmelJohn T Clapacs, MD   1 tablet at 01/20/16 1211  .  traZODone (DESYREL) tablet 100 mg  100 mg Oral QHS Shari ProwsJolanta B Pucilowska, MD   100 mg at 01/20/16 2203  . White Petrolatum OINT 1 application  1 application Topical PRN Shari ProwsJolanta B Pucilowska, MD        Lab Results:  Results for orders placed or performed during the hospital encounter of 01/17/16 (from the past 48 hour(s))  hCG, quantitative, pregnancy     Status: Abnormal   Collection Time: 01/20/16  3:02 PM  Result Value Ref Range   hCG, Beta Chain, Quant, S 66496 (H) <5 mIU/mL    Comment:          GEST. AGE      CONC.  (mIU/mL)   <=1 WEEK        5 - 50     2 WEEKS       50 - 500     3 WEEKS       100 - 10,000     4 WEEKS     1,000 - 30,000     5 WEEKS     3,500 - 115,000   6-8 WEEKS     12,000 - 270,000    12 WEEKS     15,000 - 220,000        FEMALE AND NON-PREGNANT FEMALE:     LESS THAN 5 mIU/mL   Urinalysis complete, with microscopic ([facility] only)     Status: Abnormal   Collection Time: 01/20/16  3:10 PM  Result Value Ref Range   Color, Urine YELLOW (A) YELLOW   APPearance HAZY (A) CLEAR   Glucose, UA 150 (A) NEGATIVE mg/dL   Bilirubin Urine NEGATIVE NEGATIVE   Ketones, ur NEGATIVE NEGATIVE mg/dL   Specific Gravity, Urine 1.008 1.005 - 1.030   Hgb urine dipstick NEGATIVE NEGATIVE   pH 7.0 5.0 - 8.0   Protein, ur NEGATIVE NEGATIVE mg/dL   Nitrite NEGATIVE NEGATIVE   Leukocytes, UA 1+ (A) NEGATIVE   RBC / HPF 0-5 0 - 5 RBC/hpf   WBC, UA 0-5 0 - 5 WBC/hpf   Bacteria, UA RARE (A) NONE SEEN   Squamous Epithelial / LPF 0-5 (A) NONE SEEN    Blood Alcohol level:  Lab Results  Component Value Date   ETH <5 01/17/2016    Metabolic Disorder Labs: No results found for: HGBA1C, MPG No results found for: PROLACTIN No results found for: CHOL, TRIG, HDL, CHOLHDL, VLDL, LDLCALC  Physical Findings: AIMS: Facial and Oral Movements Muscles of Facial Expression: None, normal Lips and Perioral Area: None, normal Jaw: None, normal Tongue: None, normal,Extremity Movements Upper  (arms, wrists, hands, fingers): None, normal Lower (legs, knees, ankles, toes): None, normal, Trunk Movements Neck, shoulders, hips: None, normal,  ,  Dental Status Current problems with teeth and/or dentures?: No Does patient usually wear dentures?: No  CIWA:    COWS:  COWS Total Score: 1  Musculoskeletal: Strength & Muscle Tone: within normal limits Gait & Station: normal Patient leans: N/A  Psychiatric Specialty Exam: Physical Exam  Nursing note and vitals reviewed.   Review of Systems  Psychiatric/Behavioral: Positive for depression, suicidal ideas and substance abuse.  All other systems reviewed and are negative.   Blood pressure 109/68, pulse 84, temperature 98.7 F (37.1 C), temperature source Oral, resp. rate 18, height  (1.651 m), weight 108.863 kg (240 lb), last menstrual period 10/23/2015, SpO2 99 %.Body mass index is 39.94 kg/(m^2).  General Appearance: Disheveled  Eye Contact:  Good  Speech:  Clear and Coherent  Volume:  Normal  Mood:  Depressed, Hopeless and Worthless  Affect:  Blunt  Thought Process:  Goal Directed  Orientation:  Full (Time, Place, and Person)  Thought Content:  WDL  Suicidal Thoughts:  Yes.  with intent/plan  Homicidal Thoughts:  No  Memory:  Immediate;   Fair Recent;   Fair Remote;   Fair  Judgement:  Poor  Insight:  Lacking  Psychomotor Activity:  Normal  Concentration:  Concentration: Fair and Attention Span: Fair  Recall:  [employer] of Knowledge:  Fair  Language:  Fair  Akathisia:  No  Handed:  Right  AIMS (if indicated):     Assets:  Communication Skills Desire for Improvement Physical Health Resilience Social Support  ADL's:  Intact  Cognition:  WNL  Sleep:  Number of Hours: 6     Treatment Plan Summary: Daily contact with patient to assess and evaluate symptoms and progress in treatment and Medication management   Ms. Bittick is a 25 year old pregnant female with a history of depression, anxiety, suicide attempts,  and substance abuse admitted to the hospital for suicidal ideation with the context of treatment noncompliance and severe social stressors.  1. Suicidal ideation. The patient is able to contract for safety in the hospital.   2. Mood. She did well on Prozac in the past and could not tolerate Zoloft. We will restart Prozac for depression.   3. Insomnia. We offered trazodone.  4. Pregnancy. We started prenatal vitamins, folic acid, and Zofran for nausea. The patient had ultrasound performed recently at [facility]. We will continue hCG level monitoring.  5. Substance abuse. The patient was positive for cannabis and barbiturates. She denies barbiturate abuse. There were no symptoms of withdrawal.   6. Smoking. Nicotine patch is available.  7. UTI. We started Cipro.    8. Disposition. The patient will likely be discharged to [facility] in [place]. PPD was placed. She will follow up with [facility] for medication management and [facility] for pregnancy needs.   7/1: patient has improved appetite and wants to have more food available during meals. Ordered double portions.  Continue other medications as ordered  Lockie Pares, MD 01/21/2016, 8:28 AM

## 2016-01-21 NOTE — [facility] Group Notes (Signed)
[facility] LCSW Group Therapy  01/21/2016 4:38 PM  Type of Therapy:  Group Therapy  Participation Level:  Active  Participation Quality:  Appropriate, Sharing and Supportive  Affect:  Appropriate  Cognitive:  Alert and Appropriate  Insight:  Improving  Engagement in Therapy:  Engaged  Modes of Intervention:  Confrontation, Discussion and Education  Summary of Progress/Problems:Group discussed unhelpful thinking patterns and ways to challenge unhelpful thinking.  Utilized Designer, industrial/productconfrontation and affirmations to practice changing thoughts.  Group shared ways that unhelpful thinking has impacted them being admitted to the hospital and use affirmation cards to affirm one another's goals. Cheryl Holt shared about beliefs of deserving safety and respect and group affirmed her right to these things.  Cheryl Holt, Cheryl Holt, MSW, LCSW 01/21/2016, 4:38 PM

## 2016-01-21 NOTE — Progress Notes (Signed)
Cheryl CopierBethany was pleasant and cooperative with treatment. She interacted well with peers and staff. She spent most of the evening in the dayroom with peers. She denied suicidal thoughts on shift, she was medication compliant. She appears to be in bed resting quietly at this time.

## 2016-01-21 NOTE — [facility] Group Notes (Signed)
[facility] Group Notes:  (Nursing/MHT/Case Management/Adjunct)  Date:  01/21/2016  Time:  12:18 PM  Type of Therapy:  Psychoeducational Skills  Participation Level:  Did Not Attend  Lynelle SmokeCara Travis [facility] 01/21/2016, 12:18 PM

## 2016-01-21 NOTE — [facility] Group Notes (Signed)
[facility] Group Notes:  (Nursing/MHT/Case Management/Adjunct)  Date:  01/21/2016  Time:  9:54 PM  Type of Therapy:  Evening Wrap-up Group  Participation Level:  Active  Participation Quality:  Appropriate, Attentive and Sharing  Affect:  Appropriate  Cognitive:  Alert and Appropriate  Insight:  Appropriate, Good and Improving  Engagement in Group:  Developing/Improving, Engaged and Improving  Modes of Intervention:  Discussion  Summary of Progress/Problems:  Cheryl MorrowChelsea Holt Cheryl Holt 01/21/2016, 9:54 PM

## 2016-01-21 NOTE — Progress Notes (Signed)
Pt has been pleasant and cooperative.Pt talked about events leading to hospitalization. Pt's mood and affect has been depressed but brightens on approach. Pt denies SI and A/V hallucinations. Pt rates her depression 7/10. Will continue to observe and maintain a safe environment

## 2016-01-22 MED ORDER — CIPROFLOXACIN HCL 250 MG PO TABS: 250.0000 mg | ORAL_TABLET | Freq: Two times a day (BID) | ORAL | Status: DC

## 2016-01-22 MED ORDER — FLUOXETINE HCL 20 MG PO CAPS
30.0000 mg | ORAL_CAPSULE | Freq: Every day | ORAL | Status: DC
  Administered 2016-01-23: 30 mg via ORAL
  Filled 2016-01-22: qty 1

## 2016-01-22 MED ORDER — ONDANSETRON HCL 4 MG PO TABS: 4.0000 mg | ORAL_TABLET | Freq: Two times a day (BID) | ORAL | Status: DC | PRN

## 2016-01-22 MED ORDER — PRENATAL MULTIVITAMIN CH: 1.0000 | ORAL_TABLET | Freq: Every day | ORAL | Status: DC

## 2016-01-22 MED ORDER — FOLIC ACID 1 MG PO TABS: 2.0000 mg | ORAL_TABLET | Freq: Every day | ORAL | Status: DC

## 2016-01-22 MED ORDER — FLUOXETINE HCL 10 MG PO CAPS: 30.0000 mg | ORAL_CAPSULE | Freq: Every day | ORAL | Status: DC

## 2016-01-22 MED ORDER — TRAZODONE HCL 100 MG PO TABS: 100.0000 mg | ORAL_TABLET | Freq: Every evening | ORAL | Status: DC | PRN

## 2016-01-22 NOTE — [facility] Group Notes (Addendum)
[facility] LCSW Group Therapy   01/22/2016  11AM  Type of Therapy: Group Therapy   Participation Level: Did Not Attend. Patient invited to participate but declined.    Eleaner Dibartolo F. Corianna Avallone, MSW, LCSWA, LCAS     
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
## 2016-01-22 NOTE — Progress Notes (Signed)
D: Pt denies SI/HI/AVH. Pt is pleasant and cooperative, affect is bright, appears less anxious and she is interacting with peers and staff appropriately.  A: Pt was offered support and encouragement. Pt was given scheduled medications. Pt was encouraged to attend groups. Q 15 minute checks were done for safety.  R:Pt attends groups and interacts well with peers and staff. Pt is taking medication. Pt has no complaints.Pt receptive to treatment and safety maintained on unit.

## 2016-01-22 NOTE — Progress Notes (Signed)
[facility] MD Progress Note  01/22/2016 11:04 AM Marcelino ScotBethany Sylva  MRN:  [id number]  Subjective:   Ms. Cheryl Holt is a 25 year old pregnant femalewith a history of depression and substance abuse admitted for worsening of depression and suicidal ideation the context of poor social support and homelessness and substance abuse. She did not receive prenatal care except an ultrasound at [facility] indicating 709 week old fetus. She was started on Prozac that was helpful in the past. She submitted application to [facility] [place] where they have a bed available. The patient still complains of depression and passing suicidal ideations.   Today,  She is in bed and states that she is in a "blah mood". Patient expressed frustration because she didn't get as much to eat this morning for breakfast despite the order for double portions.  She has a headache today after switching from the nicotine pack to gum due to irritation. Denies SI/HI. She's planning to go to her ex's house prior to [facility].   She had fair sleep and continues to have increased appetite.    Principal Problem: Major depressive disorder, recurrent severe without psychotic features (HCC) Diagnosis:   Patient Active Problem List   Diagnosis Date Noted  . Abdominal pregnancy with intrauterine pregnancy [O00.01] 01/18/2016  . Cannabis use disorder, moderate, dependence (HCC) [F12.20] 01/18/2016  . Sedative, hypnotic or anxiolytic use disorder, mild, abuse [F13.10] 01/18/2016  . Tobacco use disorder [F17.200] 01/18/2016  . Opioid use disorder, severe, in sustained remission [F11.90] 01/18/2016  . Major depressive disorder, recurrent severe without psychotic features (HCC) [F33.2] 01/17/2016   Total Time spent with patient: 20 minutes  Past Psychiatric History: Depression, substance abuse.  Past Medical History:  Past Medical History  Diagnosis Date  . Depression    History reviewed. No pertinent past surgical  history. Family History: History reviewed. No pertinent family history. Family Psychiatric  History: See H&P. Social History:  History  Alcohol Use No     History  Drug Use  . Yes    Social History   Social History  . Marital Status: Legally Separated    Spouse Name: N/A  . Number of Children: N/A  . Years of Education: N/A   Social History Main Topics  . Smoking status: Current Every Day Smoker -- 0.50 packs/day for 10 years    Types: Cigarettes  . Smokeless tobacco: None  . Alcohol Use: No  . Drug Use: Yes  . Sexual Activity: Yes    Birth Control/ Protection: Coitus interruptus   Other Topics Concern  . None   Social History Narrative   Additional Social History:                         Sleep: Poor  Appetite:  Good  Current Medications: Current Facility-Administered Medications  Medication Dose Route Frequency Provider Last Rate Last Dose  . acetaminophen (TYLENOL) tablet 650 mg  650 mg Oral Q6H PRN Audery AmelJohn T Clapacs, MD   650 mg at 01/22/16 0907  . alum & mag hydroxide-simeth (MAALOX/MYLANTA) 200-200-20 MG/5ML suspension 30 mL  30 mL Oral Q4H PRN Audery AmelJohn T Clapacs, MD      . ciprofloxacin (CIPRO) tablet 250 mg  250 mg Oral BID Shari ProwsJolanta B Pucilowska, MD   250 mg at 01/22/16 0909  . diphenhydrAMINE (BENADRYL) capsule 50 mg  50 mg Oral QHS PRN Audery AmelJohn T Clapacs, MD   50 mg at 01/20/16 1211  . FLUoxetine (PROZAC) capsule  20 mg  20 mg Oral Daily Shari Prows, MD   20 mg at 01/22/16 0907  . folic acid (FOLVITE) tablet 2 mg  2 mg Oral Q lunch Jolanta B Pucilowska, MD   2 mg at 01/21/16 1220  . magnesium hydroxide (MILK OF MAGNESIA) suspension 30 mL  30 mL Oral Daily PRN Audery Amel, MD      . nicotine (NICODERM CQ - dosed in mg/24 hours) patch 21 mg  21 mg Transdermal Daily Audery Amel, MD   21 mg at 01/21/16 0840  . nicotine polacrilex (NICORETTE) gum 2 mg  2 mg Oral PRN Betha Shadix L Adessa Primiano, MD      . ondansetron (ZOFRAN) tablet 4 mg  4 mg Oral Q8H PRN  Shari Prows, MD   4 mg at 01/22/16 0651  . prenatal multivitamin tablet 1 tablet  1 tablet Oral Q1200 Audery Amel, MD   1 tablet at 01/21/16 1220  . traZODone (DESYREL) tablet 100 mg  100 mg Oral QHS Jolanta B Pucilowska, MD   100 mg at 01/22/16 0000  . White Petrolatum OINT 1 application  1 application Topical PRN Shari Prows, MD        Lab Results:  Results for orders placed or performed during the hospital encounter of 01/17/16 (from the past 48 hour(s))  hCG, quantitative, pregnancy     Status: Abnormal   Collection Time: 01/20/16  3:02 PM  Result Value Ref Range   hCG, Beta Chain, Quant, S 66496 (H) <5 mIU/mL    Comment:          GEST. AGE      CONC.  (mIU/mL)   <=1 WEEK        5 - 50     2 WEEKS       50 - 500     3 WEEKS       100 - 10,000     4 WEEKS     1,000 - 30,000     5 WEEKS     3,500 - 115,000   6-8 WEEKS     12,000 - 270,000    12 WEEKS     15,000 - 220,000        FEMALE AND NON-PREGNANT FEMALE:     LESS THAN 5 mIU/mL   Urinalysis complete, with microscopic ([facility] only)     Status: Abnormal   Collection Time: 01/20/16  3:10 PM  Result Value Ref Range   Color, Urine YELLOW (A) YELLOW   APPearance HAZY (A) CLEAR   Glucose, UA 150 (A) NEGATIVE mg/dL   Bilirubin Urine NEGATIVE NEGATIVE   Ketones, ur NEGATIVE NEGATIVE mg/dL   Specific Gravity, Urine 1.008 1.005 - 1.030   Hgb urine dipstick NEGATIVE NEGATIVE   pH 7.0 5.0 - 8.0   Protein, ur NEGATIVE NEGATIVE mg/dL   Nitrite NEGATIVE NEGATIVE   Leukocytes, UA 1+ (A) NEGATIVE   RBC / HPF 0-5 0 - 5 RBC/hpf   WBC, UA 0-5 0 - 5 WBC/hpf   Bacteria, UA RARE (A) NONE SEEN   Squamous Epithelial / LPF 0-5 (A) NONE SEEN    Blood Alcohol level:  Lab Results  Component Value Date   ETH <5 01/17/2016    Metabolic Disorder Labs: No results found for: HGBA1C, MPG No results found for: PROLACTIN No results found for: CHOL, TRIG, HDL, CHOLHDL, VLDL, LDLCALC  Physical Findings: AIMS: Facial and Oral  Movements Muscles of Facial Expression: None, normal  Lips and Perioral Area: None, normal Jaw: None, normal Tongue: None, normal,Extremity Movements Upper (arms, wrists, hands, fingers): None, normal Lower (legs, knees, ankles, toes): None, normal, Trunk Movements Neck, shoulders, hips: None, normal,  , Dental Status Current problems with teeth and/or dentures?: No Does patient usually wear dentures?: No  CIWA:    COWS:  COWS Total Score: 1  Musculoskeletal: Strength & Muscle Tone: within normal limits Gait & Station: normal Patient leans: N/A  Psychiatric Specialty Exam: Physical Exam  Nursing note and vitals reviewed.   Review of Systems  Psychiatric/Behavioral: Positive for depression, suicidal ideas and substance abuse.  All other systems reviewed and are negative.   Blood pressure 128/67, pulse 72, temperature 98.1 F (36.7 C), temperature source Oral, resp. rate 18, height 5\' 5"  (1.651 m), weight 108.863 kg (240 lb), last menstrual period 10/23/2015, SpO2 99 %.Body mass index is 39.94 kg/(m^2).  General Appearance: Disheveled  Eye Contact:  Good  Speech:  Clear and Coherent  Volume:  Normal  Mood:  Depressed, Hopeless and Worthless  Affect:  Blunt  Thought Process:  Goal Directed  Orientation:  Full (Time, Place, and Person)  Thought Content:  WDL  Suicidal Thoughts:  Yes.  with intent/plan  Homicidal Thoughts:  No  Memory:  Immediate;   Fair Recent;   Fair Remote;   Fair  Judgement:  Poor  Insight:  Lacking  Psychomotor Activity:  Normal  Concentration:  Concentration: Fair and Attention Span: Fair  Recall:  [employer]Fair  Fund of Knowledge:  Fair  Language:  Fair  Akathisia:  No  Handed:  Right  AIMS (if indicated):     Assets:  Communication Skills Desire for Improvement Physical Health Resilience Social Support  ADL's:  Intact  Cognition:  WNL  Sleep:  Number of Hours: 5.45     Treatment Plan Summary: Daily contact with patient to assess and evaluate  symptoms and progress in treatment and Medication management   Ms. Cheryl Holt is a 25 year old pregnant female with a history of depression, anxiety, suicide attempts, and substance abuse admitted to the hospital for suicidal ideation with the context of treatment noncompliance and severe social stressors.  1. Suicidal ideation. The patient is able to contract for safety in the hospital.   2. Mood. She did well on Prozac in the past and could not tolerate Zoloft. We will restart Prozac for depression.   3. Insomnia. We offered trazodone.  4. Pregnancy. We started prenatal vitamins, folic acid, and Zofran for nausea. The patient had [facility]. We will continue hCG level monitoring.  5. Substance abuse. The patient was positive for cannabis and barbiturates. She denies barbiturate abuse. There were no symptoms of withdrawal.   6. Smoking. Nicotine patch is available. D/C Nicotine Patch due to irritation. Patient has Nicotine Gum.  7. UTI. We started Cipro.    8. Disposition. The patient will likely be discharged [facility] [place]. PPD was placed. She will [facility] and [facility] needs.   7/1: patient has improved appetite and wants to have more food available during meals. Ordered double portions.  Continue other medications as ordered  7/2: Confirmed that double portions was ordered. Spoke with nursing to ensure that she is provided with increased food. Added Colace for constipation Tylenol for headaches. Increased Prozac to 30 mg to address depressive symptoms   Rushie Brazel,  Kevis Qu L, MD 01/22/2016, 11:04 AM

## 2016-01-22 NOTE — Progress Notes (Signed)
Pt has been pleasant and cooperative but her mood and affect has been depressed. Pt denies SI and A/V hallucinations. Pt has not attended any unit activities this period and has been less active on the unit. Pt has been more seclusive this period.

## 2016-01-22 NOTE — [facility] Group Notes (Signed)
[facility] Group Notes:  (Nursing/MHT/Case Management/Adjunct)  Date:  01/22/2016  Time:  3:15 PM  Type of Therapy:  Psychoeducational Skills  Participation Level:  Did Not Attend  Lynelle SmokeCara Travis [facility] 01/22/2016, 3:15 PM

## 2016-01-22 NOTE — [facility] Suicide Risk Assessment (Addendum)
[facility]   Principal Problem: Major depressive disorder, recurrent severe without psychotic features [facility](HCC) Discharge Diagnoses:  Patient Active Problem List   Diagnosis Date Noted  . Abdominal pregnancy with intrauterine pregnancy [O00.01] 01/18/2016  . Cannabis use disorder, moderate, dependence (HCC) [F12.20] 01/18/2016  . Sedative, hypnotic or anxiolytic use disorder, mild, abuse [F13.10] 01/18/2016  . Tobacco use disorder [F17.200] 01/18/2016  . Opioid use disorder, severe, in sustained remission [F11.90] 01/18/2016  . Major depressive disorder, recurrent severe without psychotic features (HCC) [F33.2] 01/17/2016    Total Time spent with patient: 30 minutes  Musculoskeletal: Strength & Muscle Tone: within normal limits Gait & Station: normal Patient leans: N/A  Psychiatric Specialty Exam: Review of Systems  Psychiatric/Behavioral: The patient is nervous/anxious.   All other systems reviewed and are negative.   Blood pressure 128/67, pulse 72, temperature 98.1 F (36.7 C), temperature source Oral, resp. rate 18, height 5\' 5"  (1.651 m), weight 108.863 kg (240 lb), last menstrual period 10/23/2015, SpO2 99 %.Body mass index is 39.94 kg/(m^2).  General Appearance: Casual  Eye Contact::  Good  Speech:  Clear and Coherent409  Volume:  Normal  Mood:  Anxious  Affect:  Appropriate  Thought Process:  Goal Directed  Orientation:  Full (Time, Place, and Person)  Thought Content:  WDL  Suicidal Thoughts:  No  Homicidal Thoughts:  No  Memory:  Immediate;   Fair Recent;   Fair Remote;   Fair  Judgement:  Poor  Insight:  Shallow  Psychomotor Activity:  Normal  Concentration:  Fair  Recall:  [employer]Fair  Fund of Knowledge:Fair  Language: Fair  Akathisia:  No  Handed:  Right  AIMS (if indicated):     Assets:  Communication Skills Desire for Improvement Physical Health Resilience Social Support  Sleep:  Number of Hours: 5.45  Cognition: WNL  ADL's:   Intact   Mental Status Per Nursing Assessment::   On Admission:  NA  Demographic Factors:  Adolescent or young adult, Caucasian, Low socioeconomic status and Unemployed  Loss Factors: Loss of significant relationship and Financial problems/change in socioeconomic status  Historical Factors: Impulsivity  Risk Reduction Factors:   Pregnancy, Responsible for children under 26 years of age and Sense of responsibility to family  Continued Clinical Symptoms:  Depression:   Comorbid alcohol abuse/dependence Impulsivity Alcohol/Substance Abuse/Dependencies  Cognitive Features That Contribute To Risk:  None    Suicide Risk:  Minimal: No identifiable suicidal ideation.  Patients presenting with no risk factors but with morbid ruminations; may be classified as minimal risk based on the severity of the depressive symptoms    Plan Of Care/Follow-up recommendations:  Activity:  as tolerated. Diet:  regular. Other:  keep follow up appointment.  Kristine LineaJolanta Sumiya Mamaril, MD 01/22/2016, 8:15 PM

## 2016-01-23 MED ORDER — CIPROFLOXACIN HCL 250 MG PO TABS: 250.0000 mg | ORAL_TABLET | Freq: Two times a day (BID) | ORAL | Status: DC

## 2016-01-23 MED ORDER — FOLIC ACID 1 MG PO TABS: 2.0000 mg | ORAL_TABLET | Freq: Every day | ORAL | Status: DC

## 2016-01-23 MED ORDER — FLUOXETINE HCL 10 MG PO CAPS: 30.0000 mg | ORAL_CAPSULE | Freq: Every day | ORAL | Status: DC

## 2016-01-23 MED ORDER — TRAZODONE HCL 100 MG PO TABS: 100.0000 mg | ORAL_TABLET | Freq: Every evening | ORAL | Status: DC | PRN

## 2016-01-23 MED ORDER — PRENATAL MULTIVITAMIN CH: 1.0000 | ORAL_TABLET | Freq: Every day | ORAL | Status: DC

## 2016-01-23 MED ORDER — CIPROFLOXACIN HCL 250 MG PO TABS
250.0000 mg | ORAL_TABLET | Freq: Two times a day (BID) | ORAL | Status: DC
  Filled 2016-01-23: qty 20

## 2016-01-23 MED ORDER — ONDANSETRON HCL 4 MG PO TABS: 4.0000 mg | ORAL_TABLET | Freq: Two times a day (BID) | ORAL | Status: DC | PRN

## 2016-01-23 NOTE — Progress Notes (Signed)
Recreation Therapy Notes  Date: 07.03.17 Time: 1:00 pm Location: Craft Room  Group Topic: Wellness  Goal Area(s) Addresses:  Patient will identify at least one item per dimension of health. Patient will examine areas they are deficient in.  Behavioral Response: Attentive, Interactive  Intervention: 6 Dimensions of Health  Activity: Patients were given a definition sheet of the 6 dimensions of health and a worksheet with the 6 dimensions listed. Patients were instructed to write things they were currently doing in each dimension.  Education: LRT educated patients on ways to improve each dimension of health.  Education Outcome: Acknowledges education/In group clarification offered   Clinical Observations/Feedback: Patient completed activity by writing at least 1 item in each dimension. Patient contributed to group discussion by stating what areas she was giving enough attention to, what areas she was not giving enough attention to, and how she can increase certain areas. Patient talked about her grieving process over her mother with a peer.  Jacquelynn CreeGreene,Dewarren Ledbetter M, LRT/CTRS 01/23/2016 2:08 PM

## 2016-01-23 NOTE — Progress Notes (Signed)
  [facility] :  Will you be returning to the same living situation after discharge:  No. Plans to stay with a friend until [facility]. At discharge, do you have transportation home?: Yes,  [facility] provided PART bus fare and [place] pass Do you have the ability to pay for your medications: No.7 day supply provided  Release of information consent forms completed and in the chart;  Patient's signature needed at discharge.  Patient to Follow up at: Follow-up Information    [facility]   Specialty:  Behavioral Health   Why:  Hospital Follow up, Outpatient Medication Management, Therapy, Walk-ins Monday-Friday between 8am-3pm.   Contact information:   [address] [place] [address] [phone]       Next level of care provider has [facility] to [facility] Link:no  Safety Planning and Suicide Prevention discussed: Yes,     Have you used any form of tobacco in the last 30 days? (Cigarettes, Smokeless Tobacco, Cigars, and/or Pipes): Yes  Has patient been referred to the Quitline?: Patient refused referral  Patient has been referred for addiction treatment: Yes  Daylan Boggess, Cleda DaubSara P, MSW, LCSW 01/23/2016, 4:32 PM

## 2016-01-23 NOTE — Progress Notes (Signed)
Patient discharged home. Discharge instructions provided and explained. Medications reviewed. Rx given. All questions answered. Pt stable at discharge. Belongings returned, medications stored in pharmacy returned. 7 day supply of medications given. Pt stable at discharge. Denies SI, HI, AVH. Pt hopeful for possible job at [employer].

## 2016-01-23 NOTE — Tx Team (Signed)
Interdisciplinary Treatment Plan Update (Adult)  Date:  01/23/2016 Time Reviewed:  4:34 PM Patient discharging today: Review of initial/current patient goals per problem list:  1. Goal(s): Patient will participate in aftercare plan   Met: Yes  Target date: 3-5 days post admission date   As evidenced by: Patient will participate within aftercare plan AEB aftercare provider and housing plan at discharge being identified.  6/30: Pt will discharge home to [place] to live with her mother and will follow up with [facility] for medication management, substance abuse treatment and therapy.    2. Goal (s): Patient will exhibit decreased depressive symptoms and suicidal ideations.   Met: YES  Target date: 3-5 days post admission date   As evidenced by: Patient will utilize self-rating of depression at 3 or below and demonstrate decreased signs of depression or be deemed stable for discharge by MD.  6/30: Goal progressing.   3. Goal(s): Patient will demonstrate decreased signs and symptoms of anxiety.   Met: YES  Target date: 3-5 days post admission date   As evidenced by: Patient will utilize self-rating of anxiety at 3 or below and demonstrated decreased signs of anxiety, or be deemed stable for discharge by MD  6/30: Goal progressing.    4. Goal(s): Patient will demonstrate decreased signs of withdrawal due to substance abuse   Met: Yes  Target date: 3-5 days post admission date   As evidenced by: Patient will produce a CIWA/COWS score of 0, have stable vitals signs, and no symptoms of withdrawal  6/30: Goal progressing.  Scribe for Treatment Team:   August Saucer, 01/23/2016, 4:34 PM, MSW, LCSW

## 2016-01-23 NOTE — [facility] Group Notes (Signed)
[facility] Group Notes:  (Nursing/MHT/Case Management/Adjunct)  Date:  01/23/2016  Time:  4:13 PM  Type of Therapy:  Psychoeducational Skills  Participation Level:  Active  Participation Quality:  Appropriate and Monopolizing  Affect:  Anxious and Appropriate  Cognitive:  Oriented  Insight:  Appropriate  Engagement in Group:  Engaged and Monopolizing  Modes of Intervention:  Discussion and Education  Summary of Progress/Problems:  Cheryl Holt M Cheryl Holt 01/23/2016, 4:13 PM

## 2016-01-23 NOTE — [facility] Suicide Risk Assessment (Signed)
[facility] INPATIENT:  Family/Significant Other Suicide Prevention Education  Suicide Prevention Education:  Patient Refusal for Family/Significant Other Suicide Prevention Education: The patient Cheryl Holt has refused to provide written consent for family/significant other to be provided Family/Significant Other Suicide Prevention Education during admission and/or prior to discharge.  Physician notified.  Glennon MacLaws, Muadh Creasy P, MSW, LCSW 01/23/2016, 3:59 PM

## 2016-01-23 NOTE — Progress Notes (Signed)
D: Pt denies SI/HI/AVH. Pt is pleasant and cooperative, affect is sad but brightens upon approach. Pt appears less anxious and she is interacting with peers and staff appropriately.  A: Pt was offered support and encouragement. Pt was given scheduled medications. Pt was encouraged to attend groups. Q 15 minute checks were done for safety.  R:Pt attends groups and interacts well with peers and staff. Pt is taking medication. Pt has no complaints.Pt receptive to treatment and safety maintained on unit.

## 2016-01-23 NOTE — [facility] Group Notes (Signed)
[facility] LCSW Group Therapy   01/23/2016 9:30am Type of Therapy: Group Therapy   Participation Level: Invited but did not attend.  Participation Quality: Invited but did not attend.  Lynden OxfordKadijah R. Brennon Otterness, LCSWA  01/23/16

## 2016-01-23 NOTE — Plan of Care (Signed)
Problem: Education: Goal: Emotional status will improve Outcome: Not Progressing Patient having feelings of hopelessness and helplessness

## 2016-01-23 NOTE — Plan of Care (Signed)
Problem: Coping: Goal: Ability to verbalize frustrations and anger appropriately will improve Outcome: Not Progressing Patient able to verbalize frustration.
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
## 2016-01-23 NOTE — Plan of Care (Signed)
Problem: [facility] Participation in Recreation Therapeutic Interventions Goal: STG-Patient will identify at least five coping skills for ** STG: Coping Skills - Within 4 treatment sessions, patient will verbalize at least 5 coping skills for substance abuse in each of 2 treatment sessions to decrease substance abuse post d/c.  Outcome: Completed/Met Date Met:  01/23/16 Treatment Session 2; Completed 2 out of 2: At approximately 12:20 pm, LRT met with patient in patient room. Patient verbalized 5 coping skills for substance abuse. LRT encouraged patient to participate in leisure activities.  Leonette Monarch, LRT/CTRS 07.03.17 2:15 pm Goal: STG-Other Recreation Therapy Goal (Specify) STG: Stress Management - Within 4 treatment sessions, patient will verbalize understanding of the stress management techniques in each of 2 treatment sessions to increase stress management skills post d/c.  Outcome: Completed/Met Date Met:  01/23/16 Treatment Session 2; Completed 2 out of 2: At approximately 12:20 pm, LRT met with patient in patient room. Patient reported she read over and practiced the stress management techniques. Patient verbalized understanding and reported the techniques were helpful. LRT encouraged patient to continue practicing the stress management techniques.  Leonette Monarch, LRT/CTRS 07.03.17 2:17 pm

## 2016-01-24 NOTE — Progress Notes (Signed)
Recreation Therapy Notes  INPATIENT RECREATION TR PLAN  Patient Details Name: Oluwakemi Salsberry MRN: [id number] DOB: 08-10-1990 Today's Date: 01/24/2016  Rec Therapy Plan Is patient appropriate for Therapeutic Recreation?: Yes Treatment times per week: At least once a week TR Treatment/Interventions: 1:1 session, Group participation (Comment) (Appropriate participation in daily recreational therapy tx)  Discharge Criteria Pt will be discharged from therapy if:: Treatment goals are met, Discharged Treatment plan/goals/alternatives discussed and agreed upon by:: Patient/family  Discharge Summary Short term goals set: See Care Plan Short term goals met: Complete Progress toward goals comments: One-to-one attended Which groups?: Wellness One-to-one attended: Stress management, coping skills Reason goals not met: N/A Therapeutic equipment acquired: None Reason patient discharged from therapy: Discharge from hospital Pt/family agrees with progress & goals achieved: Yes Date patient discharged from therapy: 01/23/16   Leonette Monarch, LRT/CTRS 01/24/2016, 10:05 AM

## 2016-02-21 ENCOUNTER — Encounter (HOSPITAL_COMMUNITY): Payer: Self-pay | Admitting: *Deleted

## 2016-02-21 ENCOUNTER — Inpatient Hospital Stay (HOSPITAL_COMMUNITY): Payer: Medicaid Other

## 2016-02-21 ENCOUNTER — Inpatient Hospital Stay (HOSPITAL_COMMUNITY)
Admission: AD | Admit: 2016-02-21 | Discharge: 2016-02-21 | Disposition: A | Discharge status: Home / Self Care | Payer: Medicaid Other | Source: Ambulatory Visit | Attending: Obstetrics & Gynecology | Admitting: Obstetrics & Gynecology

## 2016-02-21 DIAGNOSIS — R51 Headache: Secondary | ICD-10-CM | POA: Insufficient documentation

## 2016-02-21 DIAGNOSIS — B9689 Other specified bacterial agents as the cause of diseases classified elsewhere: Secondary | ICD-10-CM

## 2016-02-21 DIAGNOSIS — F329 Major depressive disorder, single episode, unspecified: Secondary | ICD-10-CM | POA: Diagnosis not present

## 2016-02-21 DIAGNOSIS — O99332 Smoking (tobacco) complicating pregnancy, second trimester: Secondary | ICD-10-CM | POA: Insufficient documentation

## 2016-02-21 DIAGNOSIS — O26892 Other specified pregnancy related conditions, second trimester: Secondary | ICD-10-CM | POA: Insufficient documentation

## 2016-02-21 DIAGNOSIS — O219 Vomiting of pregnancy, unspecified: Secondary | ICD-10-CM | POA: Diagnosis not present

## 2016-02-21 DIAGNOSIS — R519 Headache, unspecified: Secondary | ICD-10-CM

## 2016-02-21 DIAGNOSIS — O23592 Infection of other part of genital tract in pregnancy, second trimester: Secondary | ICD-10-CM

## 2016-02-21 DIAGNOSIS — O99342 Other mental disorders complicating pregnancy, second trimester: Secondary | ICD-10-CM | POA: Diagnosis not present

## 2016-02-21 DIAGNOSIS — N760 Acute vaginitis: Secondary | ICD-10-CM

## 2016-02-21 DIAGNOSIS — Z3A17 17 weeks gestation of pregnancy: Secondary | ICD-10-CM | POA: Diagnosis not present

## 2016-02-21 DIAGNOSIS — O209 Hemorrhage in early pregnancy, unspecified: Secondary | ICD-10-CM | POA: Insufficient documentation

## 2016-02-21 DIAGNOSIS — O4692 Antepartum hemorrhage, unspecified, second trimester: Secondary | ICD-10-CM

## 2016-02-21 DIAGNOSIS — A499 Bacterial infection, unspecified: Secondary | ICD-10-CM

## 2016-02-21 DIAGNOSIS — Z79899 Other long term (current) drug therapy: Secondary | ICD-10-CM | POA: Insufficient documentation

## 2016-02-21 DIAGNOSIS — F17210 Nicotine dependence, cigarettes, uncomplicated: Secondary | ICD-10-CM | POA: Insufficient documentation

## 2016-02-21 DIAGNOSIS — Z202 Contact with and (suspected) exposure to infections with a predominantly sexual mode of transmission: Secondary | ICD-10-CM | POA: Insufficient documentation

## 2016-02-21 LAB — URINALYSIS, ROUTINE W REFLEX MICROSCOPIC
BILIRUBIN URINE: NEGATIVE
Glucose, UA: 100 mg/dL — AB
HGB URINE DIPSTICK: NEGATIVE
Ketones, ur: 15 mg/dL — AB
Leukocytes, UA: NEGATIVE
Nitrite: NEGATIVE
PH: 7 (ref 5.0–8.0)
Protein, ur: NEGATIVE mg/dL
SPECIFIC GRAVITY, URINE: 1.015 (ref 1.005–1.030)

## 2016-02-21 LAB — RAPID URINE DRUG SCREEN, HOSP PERFORMED
Amphetamines: NOT DETECTED
BARBITURATES: NOT DETECTED
Benzodiazepines: NOT DETECTED
COCAINE: POSITIVE — AB
Opiates: POSITIVE — AB
TETRAHYDROCANNABINOL: POSITIVE — AB

## 2016-02-21 LAB — WET PREP, GENITAL
Sperm: NONE SEEN
TRICH WET PREP: NONE SEEN
Yeast Wet Prep HPF POC: NONE SEEN

## 2016-02-21 MED ORDER — ONDANSETRON 8 MG PO TBDP
8.0000 mg | ORAL_TABLET | Freq: Once | ORAL | Status: AC
  Administered 2016-02-21: 8 mg via ORAL
  Filled 2016-02-21: qty 1

## 2016-02-21 MED ORDER — CEFTRIAXONE SODIUM 250 MG IJ SOLR
250.0000 mg | Freq: Once | INTRAMUSCULAR | Status: AC
Start: 2016-02-21 — End: 2016-02-21
  Administered 2016-02-21: 250 mg via INTRAMUSCULAR
  Filled 2016-02-21: qty 250

## 2016-02-21 MED ORDER — LACTATED RINGERS IV BOLUS (SEPSIS)
1000.0000 mL | Freq: Once | INTRAVENOUS | Status: DC
Start: 2016-02-21 — End: 2016-02-21

## 2016-02-21 MED ORDER — BUTALBITAL-APAP-CAFFEINE 50-325-40 MG PO TABS
1.0000 | ORAL_TABLET | Freq: Once | ORAL | Status: AC
  Administered 2016-02-21: 1 via ORAL
  Filled 2016-02-21: qty 1

## 2016-02-21 MED ORDER — METRONIDAZOLE 500 MG PO TABS: 500.0000 mg | ORAL_TABLET | Freq: Two times a day (BID) | ORAL | 0 refills | Status: DC

## 2016-02-21 MED ORDER — PROMETHAZINE HCL 25 MG PO TABS: 25.0000 mg | ORAL_TABLET | Freq: Four times a day (QID) | ORAL | 0 refills | Status: DC | PRN

## 2016-02-21 MED ORDER — AZITHROMYCIN 250 MG PO TABS
1000.0000 mg | ORAL_TABLET | Freq: Once | ORAL | Status: AC
  Administered 2016-02-21: 1000 mg via ORAL
  Filled 2016-02-21: qty 4

## 2016-02-21 MED ORDER — DM-GUAIFENESIN ER 30-600 MG PO TB12: 1.0000 | ORAL_TABLET | Freq: Two times a day (BID) | ORAL | 0 refills | Status: DC | PRN
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
## 2016-02-21 NOTE — Discharge Instructions (Signed)

## 2016-02-22 ENCOUNTER — Telehealth: Payer: Self-pay | Admitting: *Deleted

## 2016-02-22 LAB — GC/CHLAMYDIA PROBE AMP (~~LOC~~) NOT AT ARMC
Chlamydia: NEGATIVE
Neisseria Gonorrhea: NEGATIVE

## 2016-02-22 NOTE — Telephone Encounter (Signed)
Received message left on nurse voicemail on 02/22/16 at 1224.  Patient states she was here yesterday and was given prescription for antibiotic and cough medication.  States she needs a refill on Zofran - call to [facility] on [address].  Requests a return call to [phone].

## 2016-02-23 NOTE — Telephone Encounter (Signed)
I call this patient and a man answer the phone and stated it was the wrong number.

## 2016-02-24 NOTE — Telephone Encounter (Signed)
According to the chart pheregran was called into the pharmacy on 02/21/2016.

## 2016-02-24 NOTE — Telephone Encounter (Signed)
Patient has not returned call concerning medication.

## 2016-02-26 ENCOUNTER — Encounter (HOSPITAL_COMMUNITY): Payer: Self-pay | Admitting: *Deleted

## 2016-02-26 ENCOUNTER — Inpatient Hospital Stay (HOSPITAL_COMMUNITY)
Admission: AD | Admit: 2016-02-26 | Discharge: 2016-02-26 | Disposition: A | Discharge status: Home / Self Care | Payer: Medicaid Other | Source: Ambulatory Visit | Attending: Obstetrics & Gynecology | Admitting: Obstetrics & Gynecology

## 2016-02-26 DIAGNOSIS — F419 Anxiety disorder, unspecified: Secondary | ICD-10-CM | POA: Insufficient documentation

## 2016-02-26 DIAGNOSIS — F32A Depression, unspecified: Secondary | ICD-10-CM | POA: Diagnosis present

## 2016-02-26 DIAGNOSIS — O0932 Supervision of pregnancy with insufficient antenatal care, second trimester: Secondary | ICD-10-CM

## 2016-02-26 DIAGNOSIS — R102 Pelvic and perineal pain: Secondary | ICD-10-CM | POA: Diagnosis not present

## 2016-02-26 DIAGNOSIS — O26892 Other specified pregnancy related conditions, second trimester: Secondary | ICD-10-CM | POA: Diagnosis not present

## 2016-02-26 DIAGNOSIS — F329 Major depressive disorder, single episode, unspecified: Secondary | ICD-10-CM | POA: Diagnosis not present

## 2016-02-26 DIAGNOSIS — O26899 Other specified pregnancy related conditions, unspecified trimester: Secondary | ICD-10-CM

## 2016-02-26 DIAGNOSIS — O99342 Other mental disorders complicating pregnancy, second trimester: Secondary | ICD-10-CM | POA: Diagnosis not present

## 2016-02-26 DIAGNOSIS — R109 Unspecified abdominal pain: Secondary | ICD-10-CM | POA: Diagnosis present

## 2016-02-26 DIAGNOSIS — O99332 Smoking (tobacco) complicating pregnancy, second trimester: Secondary | ICD-10-CM | POA: Diagnosis not present

## 2016-02-26 DIAGNOSIS — N949 Unspecified condition associated with female genital organs and menstrual cycle: Secondary | ICD-10-CM

## 2016-02-26 DIAGNOSIS — O99323 Drug use complicating pregnancy, third trimester: Secondary | ICD-10-CM | POA: Diagnosis present

## 2016-02-26 DIAGNOSIS — O99322 Drug use complicating pregnancy, second trimester: Secondary | ICD-10-CM

## 2016-02-26 DIAGNOSIS — Z3A18 18 weeks gestation of pregnancy: Secondary | ICD-10-CM | POA: Insufficient documentation

## 2016-02-26 DIAGNOSIS — O9989 Other specified diseases and conditions complicating pregnancy, childbirth and the puerperium: Secondary | ICD-10-CM

## 2016-02-26 DIAGNOSIS — F1910 Other psychoactive substance abuse, uncomplicated: Secondary | ICD-10-CM | POA: Diagnosis present

## 2016-02-26 LAB — RAPID URINE DRUG SCREEN, HOSP PERFORMED
AMPHETAMINES: NOT DETECTED
Barbiturates: NOT DETECTED
Benzodiazepines: NOT DETECTED
Cocaine: NOT DETECTED
OPIATES: NOT DETECTED
Tetrahydrocannabinol: POSITIVE — AB

## 2016-02-26 LAB — COMPREHENSIVE METABOLIC PANEL
ALK PHOS: 48 U/L (ref 38–126)
ALT: 77 U/L — AB (ref 14–54)
AST: 35 U/L (ref 15–41)
Albumin: 3.6 g/dL (ref 3.5–5.0)
Anion gap: 8 (ref 5–15)
BUN: 7 mg/dL (ref 6–20)
CALCIUM: 9.1 mg/dL (ref 8.9–10.3)
CHLORIDE: 105 mmol/L (ref 101–111)
CO2: 21 mmol/L — AB (ref 22–32)
CREATININE: 0.53 mg/dL (ref 0.44–1.00)
Glucose, Bld: 103 mg/dL — ABNORMAL HIGH (ref 65–99)
Potassium: 3.8 mmol/L (ref 3.5–5.1)
Sodium: 134 mmol/L — ABNORMAL LOW (ref 135–145)
Total Bilirubin: 0.5 mg/dL (ref 0.3–1.2)
Total Protein: 7.3 g/dL (ref 6.5–8.1)

## 2016-02-26 LAB — URINALYSIS, ROUTINE W REFLEX MICROSCOPIC
BILIRUBIN URINE: NEGATIVE
GLUCOSE, UA: NEGATIVE mg/dL
KETONES UR: NEGATIVE mg/dL
Leukocytes, UA: NEGATIVE
Nitrite: NEGATIVE
PROTEIN: NEGATIVE mg/dL
Specific Gravity, Urine: 1.025 (ref 1.005–1.030)
pH: 7 (ref 5.0–8.0)

## 2016-02-26 LAB — CBC WITH DIFFERENTIAL/PLATELET
BASOS PCT: 0 %
Basophils Absolute: 0 10*3/uL (ref 0.0–0.1)
EOS PCT: 1 %
Eosinophils Absolute: 0.1 10*3/uL (ref 0.0–0.7)
HCT: 39.7 % (ref 36.0–46.0)
Hemoglobin: 14.3 g/dL (ref 12.0–15.0)
LYMPHS ABS: 1.3 10*3/uL (ref 0.7–4.0)
Lymphocytes Relative: 10 %
MCH: 32.4 pg (ref 26.0–34.0)
MCHC: 36 g/dL (ref 30.0–36.0)
MCV: 90 fL (ref 78.0–100.0)
MONO ABS: 0.9 10*3/uL (ref 0.1–1.0)
Monocytes Relative: 7 %
NEUTROS ABS: 11 10*3/uL — AB (ref 1.7–7.7)
Neutrophils Relative %: 82 %
Other: 0 %
PLATELETS: 241 10*3/uL (ref 150–400)
RBC: 4.41 MIL/uL (ref 3.87–5.11)
RDW: 12.8 % (ref 11.5–15.5)
WBC: 13.3 10*3/uL — AB (ref 4.0–10.5)

## 2016-02-26 LAB — URINE MICROSCOPIC-ADD ON: BACTERIA UA: NONE SEEN

## 2016-02-26 MED ORDER — ACETAMINOPHEN 500 MG PO TABS
1000.0000 mg | ORAL_TABLET | Freq: Once | ORAL | Status: AC
  Administered 2016-02-26: 1000 mg via ORAL
  Filled 2016-02-26: qty 2

## 2016-02-26 MED ORDER — HYDROMORPHONE HCL 1 MG/ML IJ SOLN: 1.0000 mg | Freq: Once | INTRAMUSCULAR | Status: DC

## 2016-02-26 NOTE — Discharge Instructions (Signed)
Round Ligament Pain  The round ligament is a cord of muscle and tissue that helps to support the uterus. It can become a source of pain during pregnancy if it becomes stretched or twisted as the baby grows. The pain usually begins in the second trimester of pregnancy, and it can come and go until the baby is delivered. It is not a serious problem, and it does not cause harm to the baby.  Round ligament pain is usually a short, sharp, and pinching pain, but it can also be a dull, lingering, and aching pain. The pain is felt in the lower side of the abdomen or in the groin. It usually starts deep in the groin and moves up to the outside of the hip area. Pain can occur with:   A sudden change in position.   Rolling over in bed.   Coughing or sneezing.   Physical activity.  HOME CARE INSTRUCTIONS  Watch your condition for any changes. Take these steps to help with your pain:   When the pain starts, relax. Then try:    Sitting down.    Flexing your knees up to your abdomen.    Lying on your side with one pillow under your abdomen and another pillow between your legs.    Sitting in a warm bath for 15-20 minutes or until the pain goes away.   Take over-the-counter and prescription medicines only as told by your health care provider.   Move slowly when you sit and stand.   Avoid long walks if they cause pain.   Stop or lessen your physical activities if they cause pain.  SEEK MEDICAL CARE IF:   Your pain does not go away with treatment.   You feel pain in your back that you did not have before.   Your medicine is not helping.  SEEK IMMEDIATE MEDICAL CARE IF:   You develop a fever or chills.   You develop uterine contractions.   You develop vaginal bleeding.   You develop nausea or vomiting.   You develop diarrhea.   You have pain when you urinate.     This information is not intended to replace advice given to you by your health care provider. Make sure you discuss any questions you have with your health  care provider.     Document Released: 04/17/2008 Document Revised: 10/01/2011 Document Reviewed: 09/15/2014  [employer]vier Interactive Patient Education 2016 [employer]
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
## 2016-02-26 NOTE — MAU Note (Signed)
Pt arrived via EMS.  Pt states she is having lower abdominal cramping that started at 1230. Pt states she doesn't know how to explain it but it just hurts.

## 2016-02-27 LAB — CULTURE, OB URINE: CULTURE: NO GROWTH

## 2016-03-01 ENCOUNTER — Encounter: Payer: Self-pay | Admitting: Family Medicine

## 2016-03-27 ENCOUNTER — Ambulatory Visit (INDEPENDENT_AMBULATORY_CARE_PROVIDER_SITE_OTHER): Payer: Self-pay | Admitting: Family Medicine

## 2016-03-27 ENCOUNTER — Encounter: Payer: Self-pay | Admitting: Family Medicine

## 2016-03-27 ENCOUNTER — Ambulatory Visit (INDEPENDENT_AMBULATORY_CARE_PROVIDER_SITE_OTHER): Payer: Self-pay | Admitting: Clinical

## 2016-03-27 VITALS — BP 125/64 | HR 120 | Wt 252.7 lb

## 2016-03-27 DIAGNOSIS — Z23 Encounter for immunization: Secondary | ICD-10-CM

## 2016-03-27 DIAGNOSIS — Z124 Encounter for screening for malignant neoplasm of cervix: Secondary | ICD-10-CM

## 2016-03-27 DIAGNOSIS — F418 Other specified anxiety disorders: Secondary | ICD-10-CM

## 2016-03-27 DIAGNOSIS — F17200 Nicotine dependence, unspecified, uncomplicated: Secondary | ICD-10-CM

## 2016-03-27 DIAGNOSIS — Z3490 Encounter for supervision of normal pregnancy, unspecified, unspecified trimester: Secondary | ICD-10-CM | POA: Insufficient documentation

## 2016-03-27 DIAGNOSIS — O99322 Drug use complicating pregnancy, second trimester: Secondary | ICD-10-CM

## 2016-03-27 DIAGNOSIS — Z3492 Encounter for supervision of normal pregnancy, unspecified, second trimester: Secondary | ICD-10-CM

## 2016-03-27 DIAGNOSIS — F1910 Other psychoactive substance abuse, uncomplicated: Secondary | ICD-10-CM

## 2016-03-27 DIAGNOSIS — Z8632 Personal history of gestational diabetes: Secondary | ICD-10-CM

## 2016-03-27 DIAGNOSIS — F332 Major depressive disorder, recurrent severe without psychotic features: Secondary | ICD-10-CM

## 2016-03-27 DIAGNOSIS — F329 Major depressive disorder, single episode, unspecified: Secondary | ICD-10-CM

## 2016-03-27 DIAGNOSIS — Z113 Encounter for screening for infections with a predominantly sexual mode of transmission: Secondary | ICD-10-CM

## 2016-03-27 DIAGNOSIS — F419 Anxiety disorder, unspecified: Secondary | ICD-10-CM

## 2016-03-27 DIAGNOSIS — F4323 Adjustment disorder with mixed anxiety and depressed mood: Secondary | ICD-10-CM

## 2016-03-27 LAB — POCT URINALYSIS DIP (DEVICE)
Bilirubin Urine: NEGATIVE
Glucose, UA: NEGATIVE mg/dL
KETONES UR: NEGATIVE mg/dL
Nitrite: NEGATIVE
PH: 6.5 (ref 5.0–8.0)
PROTEIN: 30 mg/dL — AB
Specific Gravity, Urine: 1.025 (ref 1.005–1.030)
Urobilinogen, UA: 0.2 mg/dL (ref 0.0–1.0)

## 2016-03-27 MED ORDER — PROMETHAZINE HCL 25 MG PO TABS: 25.0000 mg | ORAL_TABLET | Freq: Three times a day (TID) | ORAL | 0 refills | Status: DC | PRN

## 2016-03-27 NOTE — Progress Notes (Signed)
  ASSESSMENT: Pt currently experiencing Adjustment disorder with mixed anxious and depressed mood. Pt needs to f/u with OB and psychiatrist. Pt would benefit from psychoeducation and brief theapeutic intervention regarding coping with symptoms of anxiety and depression.Pt may benefit from community resources for substances. Stage of Change: precontemplative  PLAN: 1. F/U with behavioral health clinician in 3 months, or earlier, as needed, for postpartum planning 2. Psychiatric Medications: Prozac 3. Behavioral recommendations:   -Continue with psychiatry appointments -Consider outpatient substance treatment,as needed -Consider reading educational material regarding coping with symptoms of anxiety and depression  SUBJECTIVE: Pt. referred by Jen MowElizabeth Mumaw, DO, for past BH hx Pt. reports the following symptoms/concerns: Pt states that she has been feeling an increase in anxiety and depression during current pregnancy, some relationship issues with FOB; she is seeing psychiatrist and [facility] Pt agrees to discuss any symptom changes w psychiatrist, and [facility] for postpartum planning. Pt denies any recent substance usage, but agrees to think about it for postpartum. Duration of problem: Recent increase in past two months Severity: mild   OBJECTIVE: Orientation & Cognition: Oriented x3. Thought processes normal and appropriate to situation. Mood: appropriate Affect: appropriate Appearance: appropriate Risk of harm to self or others: low risk of harm to self or others, no SI or HI today Substance use: denies any recent use Assessments administered: PHQ9: 9/ GAD7: 5  Diagnosis: Adjustment disorder with anxious and depressed mood CPT Code: F43.23  -------------------------------------------- Other(s) present in the room: none  Time spent with patient in exam room: 25 minutes 8:50 to 9:15  Depression screen PHQ 2/9 03/27/2016   Decreased Interest 1  Down, Depressed, Hopeless 0  PHQ - 2 Score 1  Altered sleeping 2  Tired, decreased energy 3  Change in appetite 1  Feeling bad or failure about yourself  0  Trouble concentrating 2  Moving slowly or fidgety/restless 0  PHQ-9 Score 9   GAD 7 : Generalized Anxiety Score 03/27/2016  Nervous, Anxious, on Edge 1  Control/stop worrying 1  Worry too much - different things 1  Trouble relaxing 0  Restless 1  Easily annoyed or irritable 0  Afraid - awful might happen 1  Total GAD 7 Score 5
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
## 2016-03-27 NOTE — Progress Notes (Signed)
Urine: trace hgb, small leukocytes Initial prenatal info packet given Initial prenatal labs/gtt today

## 2016-03-27 NOTE — Patient Instructions (Signed)
Second Trimester of Pregnancy  The second trimester is from week 13 through week 28, month 4 through 6. This is often the time in pregnancy that you feel your best. Often times, morning sickness has lessened or quit. You may have more energy, and you may get hungry more often. Your unborn baby (fetus) is growing rapidly. At the end of the sixth month, he or she is about 9 inches long and weighs about 1½ pounds. You will likely feel the baby move (quickening) between 18 and 20 weeks of pregnancy.  HOME CARE   · Avoid all smoking, herbs, and alcohol. Avoid drugs not approved by your doctor.  · Do not use any tobacco products, including cigarettes, chewing tobacco, and electronic cigarettes. If you need help quitting, ask your doctor. You may get counseling or other support to help you quit.  · Only take medicine as told by your doctor. Some medicines are safe and some are not during pregnancy.  · Exercise only as told by your doctor. Stop exercising if you start having cramps.  · Eat regular, healthy meals.  · Wear a good support bra if your breasts are tender.  · Do not use hot tubs, steam rooms, or saunas.  · Wear your seat belt when driving.  · Avoid raw meat, uncooked cheese, and liter boxes and soil used by cats.  · Take your prenatal vitamins.  · Take 1500-2000 milligrams of calcium daily starting at the 20th week of pregnancy until you deliver your baby.  · Try taking medicine that helps you poop (stool softener) as needed, and if your doctor approves. Eat more fiber by eating fresh fruit, vegetables, and whole grains. Drink enough fluids to keep your pee (urine) clear or pale yellow.  · Take warm water baths (sitz baths) to soothe pain or discomfort caused by hemorrhoids. Use hemorrhoid cream if your doctor approves.  · If you have puffy, bulging veins (varicose veins), wear support hose. Raise (elevate) your feet for 15 minutes, 3-4 times a day. Limit salt in your diet.  · Avoid heavy lifting, wear low heals,  and sit up straight.  · Rest with your legs raised if you have leg cramps or low back pain.  · Visit your dentist if you have not gone during your pregnancy. Use a soft toothbrush to brush your teeth. Be gentle when you floss.  · You can have sex (intercourse) unless your doctor tells you not to.  · Go to your doctor visits.  GET HELP IF:   · You feel dizzy.  · You have mild cramps or pressure in your lower belly (abdomen).  · You have a nagging pain in your belly area.  · You continue to feel sick to your stomach (nauseous), throw up (vomit), or have watery poop (diarrhea).  · You have bad smelling fluid coming from your vagina.  · You have pain with peeing (urination).  GET HELP RIGHT AWAY IF:   · You have a fever.  · You are leaking fluid from your vagina.  · You have spotting or bleeding from your vagina.  · You have severe belly cramping or pain.  · You lose or gain weight rapidly.  · You have trouble catching your breath and have chest pain.  · You notice sudden or extreme puffiness (swelling) of your face, hands, ankles, feet, or legs.  · You have not felt the baby move in over an hour.  · You have severe headaches that do   not go away with medicine.  · You have vision changes.     This information is not intended to replace advice given to you by your health care provider. Make sure you discuss any questions you have with your health care provider.     Document Released: 10/03/2009 Document Revised: 07/30/2014 Document Reviewed: 09/09/2012  Elsevier Interactive Patient Education ©2016 [employer]

## 2016-03-27 NOTE — Addendum Note (Signed)
Addended by: Garret ReddishBARNES, Hooria Gasparini M on: 03/27/2016 10:24 AM   Modules accepted: Orders

## 2016-03-28 LAB — PRENATAL PROFILE (SOLSTAS)
Antibody Screen: NEGATIVE
BASOS ABS: 0 {cells}/uL (ref 0–200)
Basophils Relative: 0 %
Eosinophils Absolute: 108 cells/uL (ref 15–500)
Eosinophils Relative: 1 %
HEMATOCRIT: 35.8 % (ref 35.0–45.0)
HEP B S AG: NEGATIVE
HIV: NONREACTIVE
Hemoglobin: 12.3 g/dL (ref 11.7–15.5)
Lymphocytes Relative: 13 %
Lymphs Abs: 1404 cells/uL (ref 850–3900)
MCH: 31.5 pg (ref 27.0–33.0)
MCHC: 34.4 g/dL (ref 32.0–36.0)
MCV: 91.8 fL (ref 80.0–100.0)
MONO ABS: 540 {cells}/uL (ref 200–950)
MPV: 10.1 fL (ref 7.5–12.5)
Monocytes Relative: 5 %
NEUTROS PCT: 81 %
Neutro Abs: 8748 cells/uL — ABNORMAL HIGH (ref 1500–7800)
Platelets: 253 10*3/uL (ref 140–400)
RBC: 3.9 MIL/uL (ref 3.80–5.10)
RDW: 12.6 % (ref 11.0–15.0)
RUBELLA: 6.27 {index} — AB (ref <0.90)
Rh Type: NEGATIVE
WBC: 10.8 10*3/uL (ref 3.8–10.8)

## 2016-03-28 LAB — GC/CHLAMYDIA PROBE AMP (~~LOC~~) NOT AT ARMC
Chlamydia: NEGATIVE
NEISSERIA GONORRHEA: NEGATIVE

## 2016-03-28 LAB — GLUCOSE TOLERANCE, 1 HOUR (50G) W/O FASTING: Glucose, 1 Hr, gestational: 179 mg/dL — ABNORMAL HIGH (ref <140)

## 2016-03-29 LAB — PAIN MGMT, PROFILE 6 CONF W/O MM, U
6 ACETYLMORPHINE: NEGATIVE ng/mL (ref <10)
ALCOHOL METABOLITES: NEGATIVE ng/mL (ref <500)
Amphetamines: NEGATIVE ng/mL (ref <500)
Barbiturates: NEGATIVE ng/mL (ref <300)
Benzodiazepines: NEGATIVE ng/mL (ref <100)
COCAINE METABOLITE: NEGATIVE ng/mL (ref <150)
Creatinine: 157.1 mg/dL (ref >=20.0)
Marijuana Metabolite: 22 ng/mL — ABNORMAL HIGH (ref <5)
Marijuana Metabolite: POSITIVE ng/mL — AB (ref <20)
Methadone Metabolite: NEGATIVE ng/mL (ref <100)
OXIDANT: NEGATIVE ug/mL (ref <200)
Opiates: NEGATIVE ng/mL (ref <100)
Oxycodone: NEGATIVE ng/mL (ref <100)
PH: 6.9 (ref 4.5–9.0)
PHENCYCLIDINE: NEGATIVE ng/mL (ref <25)
PLEASE NOTE: 0

## 2016-03-29 LAB — CYTOLOGY - PAP

## 2016-03-30 LAB — CULTURE, OB URINE: Organism ID, Bacteria: 10000

## 2016-04-02 ENCOUNTER — Ambulatory Visit: Payer: Self-pay

## 2016-04-02 ENCOUNTER — Encounter (HOSPITAL_COMMUNITY): Payer: Self-pay | Admitting: *Deleted

## 2016-04-02 ENCOUNTER — Inpatient Hospital Stay (HOSPITAL_COMMUNITY)
Admission: AD | Admit: 2016-04-02 | Discharge: 2016-04-02 | Disposition: A | Discharge status: Home / Self Care | Payer: Medicaid Other | Source: Ambulatory Visit | Attending: Family Medicine | Admitting: Family Medicine

## 2016-04-02 DIAGNOSIS — Z3A23 23 weeks gestation of pregnancy: Secondary | ICD-10-CM | POA: Insufficient documentation

## 2016-04-02 DIAGNOSIS — O26892 Other specified pregnancy related conditions, second trimester: Secondary | ICD-10-CM

## 2016-04-02 DIAGNOSIS — O99322 Drug use complicating pregnancy, second trimester: Secondary | ICD-10-CM

## 2016-04-02 DIAGNOSIS — L02211 Cutaneous abscess of abdominal wall: Secondary | ICD-10-CM

## 2016-04-02 DIAGNOSIS — Z6791 Unspecified blood type, Rh negative: Secondary | ICD-10-CM

## 2016-04-02 DIAGNOSIS — Z3482 Encounter for supervision of other normal pregnancy, second trimester: Secondary | ICD-10-CM

## 2016-04-02 DIAGNOSIS — Z87891 Personal history of nicotine dependence: Secondary | ICD-10-CM | POA: Insufficient documentation

## 2016-04-02 DIAGNOSIS — K2970 Gastritis, unspecified, without bleeding: Secondary | ICD-10-CM

## 2016-04-02 DIAGNOSIS — O99712 Diseases of the skin and subcutaneous tissue complicating pregnancy, second trimester: Secondary | ICD-10-CM | POA: Insufficient documentation

## 2016-04-02 MED ORDER — CEPHALEXIN 500 MG PO CAPS: 500.0000 mg | ORAL_CAPSULE | Freq: Four times a day (QID) | ORAL | 0 refills | Status: DC

## 2016-04-02 NOTE — MAU Note (Addendum)
Pt states she has an abcsess on her abdomen, is painful.  Has had this before, pt thinks it may need to be drained.  Red raised pimple-like area noted on L mid abdomen.  Denies abd pain, bleeding, or LOF.  Has HA, nausea, thought she may have had a fever last night - 99.8.

## 2016-04-02 NOTE — MAU Provider Note (Signed)
Chief Complaint:  Abscess   First Provider Initiated Contact with Patient 04/02/16 1312     HPI: Cheryl Holt is a 25 y.o. G2P1001 at 7423w1dwho presents to maternity admissions reporting an abscess on her left abdominal was for the past few days, has worsened  Has hx of multiple abscess requiring I&D.Marland Kitchen. She reports good fetal movement, denies LOF, vaginal bleeding, vaginal itching/burning, urinary symptoms, h/a, dizziness, n/v, diarrhea, constipation or fever/chills.    Other  This is a new problem. The current episode started in the past 7 days. The problem occurs constantly. The problem has been gradually worsening. Pertinent negatives include no abdominal pain, chills, fever, nausea, sore throat, swollen glands or vomiting. Exacerbated by: palpation. Treatments tried: warm compresses. The treatment provided no relief.   RN Note: Pt states she has an abcsess on her abdomen, is painful.  Has had this before, pt thinks it may need to be drained.  Red raised pimple-like area noted on L mid abdomen.  Denies abd pain, bleeding, or LOF.  Has HA, nausea, thought she may have had a fever last night - 99.8.  Past Medical History: Past Medical History:  Diagnosis Date  . Depression   . Gestational diabetes   . Headache     Past obstetric history: OB History  Gravida Para Term Preterm AB Living  2 1 1     1   SAB TAB Ectopic Multiple Live Births          1    # Outcome Date GA Lbr Len/2nd Weight Sex Delivery Anes PTL Lv  2 Current           1 Term 09/23/11    M Vag-Spont EPI N LIV      Past Surgical History: Past Surgical History:  Procedure Laterality Date  . CHOLECYSTECTOMY, LAPAROSCOPIC  2014    Family History: Family History  Problem Relation Age of Onset  . Cancer Mother     pancreatic  . Migraines Mother   . Mental illness Father   . Epilepsy Father   . Migraines Father   . Hypertension Father     Social History: Social History  Substance Use Topics  . Smoking  status: Former Smoker    Packs/day: 0.50    Years: 10.00    Types: Cigarettes    Quit date: 03/20/2016  . Smokeless tobacco: Never Used  . Alcohol use No    Allergies: No Known Allergies  Meds:  Prescriptions Prior to Admission  Medication Sig Dispense Refill Last Dose  . FLUoxetine (PROZAC) 40 MG capsule Take 40 mg by mouth daily.   Taking  . Prenatal Vit-Fe Fumarate-FA (PRENATAL MULTIVITAMIN) TABS tablet Take 1 tablet by mouth daily at 12 noon. 30 tablet 3 Taking  . promethazine (PHENERGAN) 25 MG tablet Take 1 tablet (25 mg total) by mouth every 8 (eight) hours as needed for nausea or vomiting. 15 tablet 0   . traZODone (DESYREL) 100 MG tablet Take 1 tablet (100 mg total) by mouth at bedtime as needed for sleep. (Patient not taking: Reported on 03/27/2016) 30 tablet 3 Not Taking    I have reviewed patient's Past Medical Hx, Surgical Hx, Family Hx, Social Hx, medications and allergies.   ROS:  Review of Systems  Constitutional: Negative for chills and fever.  HENT: Negative for sore throat.   Gastrointestinal: Negative for abdominal pain, nausea and vomiting.  Genitourinary: Negative for difficulty urinating, pelvic pain and vaginal bleeding.  Skin: Positive for wound.  Other systems negative  Physical Exam  Patient Vitals for the past 24 hrs:  BP Temp Temp src Pulse Resp  04/02/16 1336 137/72 - - 93 18  04/02/16 1128 117/74 97.5 F (36.4 C) Oral 98 18   Constitutional: Well-developed, well-nourished female in no acute distress.  Cardiovascular: normal rate and rhythm Respiratory: normal effort, clear to auscultation bilaterally GI: Abd soft, non-tender, gravid appropriate for gestational age.   No rebound or guarding.         There is a 2cm abscess on left abdominal wall, with some pointing and fluctuance.  Edges indurated MS: Extremities nontender, no edema, normal ROM Neurologic: Alert and oriented x 4.  GU: Neg CVAT.    FHT:  Baseline 145 , moderate variability,  accelerations present, no decelerations Contractions: none   Labs: No results found for this or any previous visit (from the past 24 hour(s)). B/NEG/-- (09/05 0919)  Imaging:  No results found.  MAU Course/MDM: I have ordered labs and reviewed results.  NST reviewed  Treatments in MAU included I&D abdominal skin abscess  Abscess I&D  Enlarged abscess palpated and visible on Left abdominal wall   Written informed consent was obtained.  Discussed complications and possible outcomes of procedure including recurrence of abscess, scarring leading to infecton, bleeding, distortion of anatomy.   Patient was examined and mass was identified.  The area was prepped with Iodine and draped in a sterile manner.  1% Lidocaine (3 ml) was then used to infiltrate area on top of the cyst, and where incision would be made.   A 3 mm incision was made using a sterile scapel. Upon palpation of the mass, a moderate amount of bloody purulent drainage was expressed through the incision.    The incision was left open, and pressure applied to slow bleeding.   Patient tolerated the procedure well, reported feeling " a lot better." - Keflex x 7 days for treatment - Recommended Tylenol  prn pain.   She was told to call to be examined if she experiences increasing swelling, pain, or fever.  - She was instructed to keep area covered for a few days.    - She will need an appointment in the office in 2-4 weeks from now for evaluation of healing, or sooner if needed.     Assessment: Abscess of abdominal wall   Plan: Discharge home Labor precautions and fetal kick counts Follow up in Office for prenatal visits and recheck of wound   Pt stable at time of discharge. Encouraged to return here or to other Urgent Care/ED if she develops worsening of symptoms, increase in pain, fever, or other concerning symptoms.     Wynelle Bourgeois CNM, MSN Certified Nurse-Midwife 04/02/2016 1:39 PM

## 2016-04-02 NOTE — Discharge Instructions (Signed)
Abscess °An abscess is an infected area that contains a collection of pus and debris. It can occur in almost any part of the body. An abscess is also known as a furuncle or boil. °CAUSES  °An abscess occurs when tissue gets infected. This can occur from blockage of oil or sweat glands, infection of hair follicles, or a minor injury to the skin. As the body tries to fight the infection, pus collects in the area and creates pressure under the skin. This pressure causes pain. People with weakened immune systems have difficulty fighting infections and get certain abscesses more often.  °SYMPTOMS °Usually an abscess develops on the skin and becomes a painful mass that is red, warm, and tender. If the abscess forms under the skin, you may feel a moveable soft area under the skin. Some abscesses break open (rupture) on their own, but most will continue to get worse without care. The infection can spread deeper into the body and eventually into the bloodstream, causing you to feel ill.  °DIAGNOSIS  °Your caregiver will take your medical history and perform a physical exam. A sample of fluid may also be taken from the abscess to determine what is causing your infection. °TREATMENT  °Your caregiver may prescribe antibiotic medicines to fight the infection. However, taking antibiotics alone usually does not cure an abscess. Your caregiver may need to make a small cut (incision) in the abscess to drain the pus. In some cases, gauze is packed into the abscess to reduce pain and to continue draining the area. °HOME CARE INSTRUCTIONS  °· Only take over-the-counter or prescription medicines for pain, discomfort, or fever as directed by your caregiver. °· If you were prescribed antibiotics, take them as directed. Finish them even if you start to feel better. °· If gauze is used, follow your caregiver's directions for changing the gauze. °· To avoid spreading the infection: °· Keep your draining abscess covered with a  bandage. °· Wash your hands well. °· Do not share personal care items, towels, or whirlpools with others. °· Avoid skin contact with others. °· Keep your skin and clothes clean around the abscess. °· Keep all follow-up appointments as directed by your caregiver. °SEEK MEDICAL CARE IF:  °· You have increased pain, swelling, redness, fluid drainage, or bleeding. °· You have muscle aches, chills, or a general ill feeling. °· You have a fever. °MAKE SURE YOU:  °· Understand these instructions. °· Will watch your condition. °· Will get help right away if you are not doing well or get worse. °  °This information is not intended to replace advice given to you by your health care provider. Make sure you discuss any questions you have with your health care provider. °  °Document Released: 04/18/2005 Document Revised: 01/08/2012 Document Reviewed: 09/21/2011 °Elsevier Interactive Patient Education ©2016 Elsevier Inc. ° °Incision and Drainage °Incision and drainage is a procedure in which a sac-like structure (cystic structure) is opened and drained. The area to be drained usually contains material such as pus, fluid, or blood.  °LET YOUR CAREGIVER KNOW ABOUT:  °· Allergies to medicine. °· Medicines taken, including vitamins, herbs, eyedrops, over-the-counter medicines, and creams. °· Use of steroids (by mouth or creams). °· Previous problems with anesthetics or numbing medicines. °· History of bleeding problems or blood clots. °· Previous surgery. °· Other health problems, including diabetes and kidney problems. °· Possibility of pregnancy, if this applies. °RISKS AND COMPLICATIONS °· Pain. °· Bleeding. °· Scarring. °· Infection. °BEFORE THE PROCEDURE  °  You may need to have an ultrasound or other imaging tests to see how large or deep your cystic structure is. Blood tests may also be used to determine if you have an infection or how severe the infection is. You may need to have a tetanus shot. °PROCEDURE  °The affected area  is cleaned with a cleaning fluid. The cyst area will then be numbed with a medicine (local anesthetic). A small incision will be made in the cystic structure. A syringe or catheter may be used to drain the contents of the cystic structure, or the contents may be squeezed out. The area will then be flushed with a cleansing solution. After cleansing the area, it is often gently packed with a gauze or another wound dressing. Once it is packed, it will be covered with gauze and tape or some other type of wound dressing.  °AFTER THE PROCEDURE  °· Often, you will be allowed to go home right after the procedure. °· You may be given antibiotic medicine to prevent or heal an infection. °· If the area was packed with gauze or some other wound dressing, you will likely need to come back in 1 to 2 days to get it removed. °· The area should heal in about 14 days. °  °This information is not intended to replace advice given to you by your health care provider. Make sure you discuss any questions you have with your health care provider. °  °Document Released: 01/02/2001 Document Revised: 01/08/2012 Document Reviewed: 09/03/2011 °Elsevier Interactive Patient Education ©2016 Elsevier Inc. ° °

## 2016-04-02 NOTE — Progress Notes (Signed)
Patient presented to office today to have her stomach abscess drain due to pain. I advised patient she would need to have a provider look at her stomach and at the moment we do not have anyone available. I offered to schedule patient in appointment slot but patient refused and stated she would just go to the ER to be check out.

## 2016-04-04 ENCOUNTER — Encounter: Payer: Self-pay | Admitting: Family Medicine

## 2016-04-04 ENCOUNTER — Ambulatory Visit (HOSPITAL_COMMUNITY)
Admission: RE | Admit: 2016-04-04 | Discharge: 2016-04-04 | Disposition: A | Discharge status: Home / Self Care | Payer: Medicaid Other | Source: Ambulatory Visit | Attending: Family Medicine | Admitting: Family Medicine

## 2016-04-04 DIAGNOSIS — Z6791 Unspecified blood type, Rh negative: Secondary | ICD-10-CM

## 2016-04-04 DIAGNOSIS — R8271 Bacteriuria: Secondary | ICD-10-CM | POA: Insufficient documentation

## 2016-04-04 DIAGNOSIS — Z3A21 21 weeks gestation of pregnancy: Secondary | ICD-10-CM | POA: Diagnosis not present

## 2016-04-04 DIAGNOSIS — O26892 Other specified pregnancy related conditions, second trimester: Secondary | ICD-10-CM | POA: Diagnosis not present

## 2016-04-04 DIAGNOSIS — O99322 Drug use complicating pregnancy, second trimester: Secondary | ICD-10-CM | POA: Insufficient documentation

## 2016-04-04 DIAGNOSIS — Z36 Encounter for antenatal screening of mother: Secondary | ICD-10-CM | POA: Diagnosis not present

## 2016-04-04 DIAGNOSIS — O26899 Other specified pregnancy related conditions, unspecified trimester: Secondary | ICD-10-CM | POA: Insufficient documentation

## 2016-04-04 DIAGNOSIS — Z3492 Encounter for supervision of normal pregnancy, unspecified, second trimester: Secondary | ICD-10-CM

## 2016-04-04 DIAGNOSIS — O09292 Supervision of pregnancy with other poor reproductive or obstetric history, second trimester: Secondary | ICD-10-CM | POA: Diagnosis not present

## 2016-04-04 LAB — CYSTIC FIBROSIS DIAGNOSTIC STUDY

## 2016-04-09 ENCOUNTER — Telehealth: Payer: Self-pay | Admitting: *Deleted

## 2016-04-09 NOTE — Telephone Encounter (Signed)
Per Dr. Shanon AceMumaw Cheryl Holt needs 3 hour GTT and hgb A1C.  Her 1  Hr gtt was 179. I called Cheryl Holt to notify her, but was unable to leave a message- heard phone ring several times but no answer.

## 2016-04-10 ENCOUNTER — Encounter (HOSPITAL_COMMUNITY): Payer: Self-pay | Admitting: *Deleted

## 2016-04-10 ENCOUNTER — Inpatient Hospital Stay (HOSPITAL_COMMUNITY)
Admission: AD | Admit: 2016-04-10 | Discharge: 2016-04-10 | Disposition: A | Discharge status: Home / Self Care | Payer: Medicaid Other | Source: Ambulatory Visit | Attending: Obstetrics and Gynecology | Admitting: Obstetrics and Gynecology

## 2016-04-10 DIAGNOSIS — Z3A24 24 weeks gestation of pregnancy: Secondary | ICD-10-CM | POA: Insufficient documentation

## 2016-04-10 DIAGNOSIS — R51 Headache: Secondary | ICD-10-CM | POA: Diagnosis present

## 2016-04-10 DIAGNOSIS — Z87891 Personal history of nicotine dependence: Secondary | ICD-10-CM | POA: Diagnosis not present

## 2016-04-10 DIAGNOSIS — G43909 Migraine, unspecified, not intractable, without status migrainosus: Secondary | ICD-10-CM | POA: Diagnosis not present

## 2016-04-10 DIAGNOSIS — O26892 Other specified pregnancy related conditions, second trimester: Secondary | ICD-10-CM | POA: Diagnosis not present

## 2016-04-10 DIAGNOSIS — G43101 Migraine with aura, not intractable, with status migrainosus: Secondary | ICD-10-CM | POA: Diagnosis not present

## 2016-04-10 LAB — URINALYSIS, ROUTINE W REFLEX MICROSCOPIC
BILIRUBIN URINE: NEGATIVE
Glucose, UA: NEGATIVE mg/dL
Hgb urine dipstick: NEGATIVE
Ketones, ur: NEGATIVE mg/dL
Leukocytes, UA: NEGATIVE
NITRITE: NEGATIVE
PROTEIN: NEGATIVE mg/dL
SPECIFIC GRAVITY, URINE: 1.02 (ref 1.005–1.030)
pH: 6.5 (ref 5.0–8.0)

## 2016-04-10 MED ORDER — BUTALBITAL-APAP-CAFFEINE 50-325-40 MG PO TABS
1.0000 | ORAL_TABLET | Freq: Once | ORAL | Status: AC
  Administered 2016-04-10: 1 via ORAL
  Filled 2016-04-10: qty 1

## 2016-04-10 MED ORDER — BUTALBITAL-APAP-CAFFEINE 50-325-40 MG PO TABS: 1.0000 | ORAL_TABLET | Freq: Four times a day (QID) | ORAL | 0 refills | Status: DC | PRN

## 2016-04-10 NOTE — MAU Provider Note (Signed)
History     CSN: [id number]  Arrival date and time: 04/10/16 [id number]   First Provider Initiated Contact with Patient 04/10/16 1110      Chief Complaint  Patient presents with  . Headache  . Chest Pain   Cheryl Holt is a 25 y.o. G2P1001 at 3052w2d who presents with HA since Friday, similar to migraines in the past but worse today. Usually goes to ER for migraine cocktail. Tried tylenol without relief, this am started to have blurry vision, dizziness and tinnitus. Also developed chest tightness in middle of chest, rates 6/10, intermittent,  worse with movement and deep breaths, attributes to anxiety. Thinks if can abort migraines would have them infrequently, currently issue is that when gets HA will last for days.    OB History    Gravida Para Term Preterm AB Living   2 1 1     1    SAB TAB Ectopic Multiple Live Births           1      Past Medical History:  Diagnosis Date  . Depression   . Gestational diabetes   . Headache     Past Surgical History:  Procedure Laterality Date  . CHOLECYSTECTOMY, LAPAROSCOPIC  2014    Family History  Problem Relation Age of Onset  . Cancer Mother     pancreatic  . Migraines Mother   . Mental illness Father   . Epilepsy Father   . Migraines Father   . Hypertension Father     Social History  Substance Use Topics  . Smoking status: Former Smoker    Packs/day: 0.50    Years: 10.00    Types: Cigarettes    Quit date: 03/20/2016  . Smokeless tobacco: Never Used  . Alcohol use No    Allergies: No Known Allergies  Prescriptions Prior to Admission  Medication Sig Dispense Refill Last Dose  . acetaminophen (TYLENOL) 500 MG tablet Take 500 mg by mouth every 6 (six) hours as needed for headache.   04/09/2016 at Unknown time  . cephALEXin (KEFLEX) 500 MG capsule Take 1 capsule (500 mg total) by mouth 4 (four) times daily. 28 capsule 0 04/10/2016 at Unknown time  . FLUoxetine (PROZAC) 40 MG capsule Take 40 mg by mouth at bedtime.     04/09/2016 at Unknown time  . Prenatal Vit-Fe Fumarate-FA (PRENATAL MULTIVITAMIN) TABS tablet Take 1 tablet by mouth daily at 12 noon. (Patient taking differently: Take 1 tablet by mouth at bedtime. ) 30 tablet 3 04/09/2016 at Unknown time  . promethazine (PHENERGAN) 25 MG tablet Take 1 tablet (25 mg total) by mouth every 8 (eight) hours as needed for nausea or vomiting. (Patient not taking: Reported on 04/10/2016) 15 tablet 0 Not Taking at Unknown time  . traZODone (DESYREL) 100 MG tablet Take 1 tablet (100 mg total) by mouth at bedtime as needed for sleep. (Patient not taking: Reported on 04/10/2016) 30 tablet 3 Not Taking at Unknown time    Review of Systems  Constitutional: Negative for chills and fever.  Eyes: Positive for blurred vision and photophobia. Negative for double vision.  Respiratory: Negative for shortness of breath and wheezing.   Cardiovascular: Positive for chest pain. Negative for palpitations.  Gastrointestinal: Positive for nausea. Negative for abdominal pain, constipation, diarrhea, heartburn and vomiting.  Genitourinary: Negative for dysuria, frequency and urgency.  Neurological: Positive for dizziness and headaches. Negative for focal weakness.   Physical Exam   Blood pressure (!) 102/54, pulse 92, temperature 98.4  F (36.9 C), temperature source Oral, resp. rate 16, last menstrual period 10/23/2015, SpO2 99 %.  Physical Exam  Constitutional: She is oriented to person, place, and time. She appears well-developed and well-nourished. No distress.  HENT:  Head: Normocephalic and atraumatic.  Eyes: Conjunctivae and EOM are normal. Pupils are equal, round, and reactive to light.  Cardiovascular: Normal rate, regular rhythm and normal heart sounds.   No murmur heard. Respiratory: Effort normal and breath sounds normal. No respiratory distress. She exhibits tenderness (along parasternal border b/l).  GI: Soft. Bowel sounds are normal. There is no tenderness. There is no  rebound and no guarding.  Musculoskeletal: Normal range of motion.  Strength 5/5  Neurological: She is alert and oriented to person, place, and time. She has normal reflexes.  Skin: Skin is warm and dry.  Psychiatric: She has a normal mood and affect.   Reproducible chest pain  MAU Course  Procedures  MDM Fiorcet given with patient improvement EKG showed NSR.   Assessment and Plan  IUP@24 .2 Migraine - Given fiorcet to take as needed at home. Cautioned patient against frequent use and possibility for rebound HA. Patient to track HA and fiorcet use and bring to next scheduled prenatal visit in 2 weeks, if using frequently may need to start on triptan.  Leland Her PGY-1 04/10/2016, 11:56 AM    OB FELLOW MAU DISCHARGE ATTESTATION  I have seen and examined this patient; I agree with above documentation in the resident's note.    Jen Mow, DO OB Fellow 11:38 PM

## 2016-04-10 NOTE — Discharge Instructions (Signed)

## 2016-04-10 NOTE — MAU Note (Signed)
Pt C/O HA since migraine, having blurred vision & dizziness.  Also states midline chest pain & tightness since this morning, feels worse when taking a deep breath.  Denies abd pain, bleeding or LOF.

## 2016-04-10 NOTE — Telephone Encounter (Signed)
Called patient- no answer or voicemail to leave a message. 

## 2016-04-11 ENCOUNTER — Encounter: Payer: Self-pay | Admitting: *Deleted

## 2016-04-11 NOTE — Telephone Encounter (Signed)
Will send letter since we have been unable to reach patient. Have also put notes on next appointment that she needs 3 hr GTT/ hgb A1C.

## 2016-04-11 NOTE — Telephone Encounter (Signed)
No answer or voicemail to leave a message. 

## 2016-04-18 ENCOUNTER — Inpatient Hospital Stay (HOSPITAL_COMMUNITY)
Admission: AD | Admit: 2016-04-18 | Discharge: 2016-04-18 | Disposition: A | Discharge status: Home / Self Care | Payer: Medicaid Other | Source: Ambulatory Visit | Attending: Family Medicine | Admitting: Family Medicine

## 2016-04-18 ENCOUNTER — Encounter (HOSPITAL_COMMUNITY): Payer: Self-pay | Admitting: *Deleted

## 2016-04-18 DIAGNOSIS — Z3A25 25 weeks gestation of pregnancy: Secondary | ICD-10-CM

## 2016-04-18 DIAGNOSIS — O99613 Diseases of the digestive system complicating pregnancy, third trimester: Secondary | ICD-10-CM | POA: Diagnosis not present

## 2016-04-18 DIAGNOSIS — R05 Cough: Secondary | ICD-10-CM | POA: Diagnosis present

## 2016-04-18 DIAGNOSIS — R059 Cough, unspecified: Secondary | ICD-10-CM

## 2016-04-18 DIAGNOSIS — J069 Acute upper respiratory infection, unspecified: Secondary | ICD-10-CM | POA: Diagnosis not present

## 2016-04-18 DIAGNOSIS — Z87891 Personal history of nicotine dependence: Secondary | ICD-10-CM | POA: Diagnosis not present

## 2016-04-18 DIAGNOSIS — O9989 Other specified diseases and conditions complicating pregnancy, childbirth and the puerperium: Secondary | ICD-10-CM

## 2016-04-18 DIAGNOSIS — J029 Acute pharyngitis, unspecified: Secondary | ICD-10-CM

## 2016-04-18 HISTORY — DX: Unspecified infectious disease: B99.9

## 2016-04-18 LAB — RAPID STREP SCREEN (MED CTR MEBANE ONLY): STREPTOCOCCUS, GROUP A SCREEN (DIRECT): NEGATIVE

## 2016-04-18 MED ORDER — BENZONATATE 100 MG PO CAPS: 100.0000 mg | ORAL_CAPSULE | Freq: Three times a day (TID) | ORAL | 0 refills | Status: DC

## 2016-04-18 NOTE — Discharge Instructions (Signed)
Cool Mist Vaporizers Vaporizers may help relieve the symptoms of a cough and cold. They add moisture to the air, which helps mucus to become thinner and less sticky. This makes it easier to breathe and cough up secretions. Cool mist vaporizers do not cause serious burns like hot mist vaporizers, which may also be called steamers or humidifiers. Vaporizers have not been proven to help with colds. You should not use a vaporizer if you are allergic to mold. HOME CARE INSTRUCTIONS  Follow the package instructions for the vaporizer.  Do not use anything other than distilled water in the vaporizer.  Do not run the vaporizer all of the time. This can cause mold or bacteria to grow in the vaporizer.  Clean the vaporizer after each time it is used.  Clean and dry the vaporizer well before storing it.  Stop using the vaporizer if worsening respiratory symptoms develop.   This information is not intended to replace advice given to you by your health care provider. Make sure you discuss any questions you have with your health care provider.   Document Released: 04/05/2004 Document Revised: 07/14/2013 Document Reviewed: 11/26/2012 Elsevier Interactive Patient Education 2016 [employer] Energy[employer] Mist Vaporizers Vaporizers may help relieve the symptoms of a cough and cold. They add moisture to the air, which helps mucus to become thinner and less sticky. This makes it easier to breathe and cough up secretions. Cool mist vaporizers do not cause serious burns like hot mist vaporizers, which may also be called steamers or humidifiers. Vaporizers have not been proven to help with colds. You should not use a vaporizer if you are allergic to mold. HOME CARE INSTRUCTIONS  Follow the package instructions for the vaporizer.  Do not use anything other than distilled water in the vaporizer.  Do not run the vaporizer all of the time. This can cause mold or bacteria to grow in the vaporizer.  Clean the vaporizer  after each time it is used.  Clean and dry the vaporizer well before storing it.  Stop using the vaporizer if worsening respiratory symptoms develop.   This information is not intended to replace advice given to you by your health care provider. Make sure you discuss any questions you have with your health care provider.   Document Released: 04/05/2004 Document Revised: 07/14/2013 Document Reviewed: 11/26/2012 Elsevier Interactive Patient Education 2016 Elsevier Inc.   Cough, Adult A cough helps to clear your throat and lungs. A cough may last only 2-3 weeks (acute), or it may last longer than 8 weeks (chronic). Many different things can cause a cough. A cough may be a sign of an illness or another medical condition. HOME CARE  Pay attention to any changes in your cough.  Take medicines only as told by your doctor.  If you were prescribed an antibiotic medicine, take it as told by your doctor. Do not stop taking it even if you start to feel better.  Talk with your doctor before you try using a cough medicine.  Drink enough fluid to keep your pee (urine) clear or pale yellow.  If the air is dry, use a cold steam vaporizer or humidifier in your home.  Stay away from things that make you cough at work or at home.  If your cough is worse at night, try using extra pillows to raise your head up higher while you sleep.  Do not smoke, and try not to be around smoke. If you need help quitting, ask your doctor.  Do not have caffeine.  Do not drink alcohol.  Rest as needed. GET HELP IF:  You have new problems (symptoms).  You cough up yellow fluid (pus).  Your cough does not get better after 2-3 weeks, or your cough gets worse.  Medicine does not help your cough and you are not sleeping well.  You have pain that gets worse or pain that is not helped with medicine.  You have a fever.  You are losing weight and you do not know why.  You have night sweats. GET HELP RIGHT  AWAY IF:  You cough up blood.  You have trouble breathing.  Your heartbeat is very fast.   This information is not intended to replace advice given to you by your health care provider. Make sure you discuss any questions you have with your health care provider.   Document Released: 03/22/2011 Document Revised: 03/30/2015 Document Reviewed: 09/15/2014 Elsevier Interactive Patient Education [employer]

## 2016-04-18 NOTE — MAU Note (Signed)
Cough and sore throat started 3 days.  Has a headache, was given med for migraine.  Didn't take it today, works when she takes it.  Coughing seems to be making head worse. Non-productive cough.

## 2016-04-18 NOTE — MAU Provider Note (Signed)
History     CSN: [id number]  Arrival date and time: 04/18/16 1005   First Provider Initiated Contact with Patient 04/18/16 1103      Chief Complaint  Patient presents with  . Cough  . Headache   HPI   [name] is a 25 y.o. female G72P1001 @ 101w3d here with cough, and headache. She has a history of migraines and feels that the Fioricet at home is helping.  Symptoms started 3 days ago; she lives at [facility] and several people there are sick with similar symptoms. A few of them were positive for strep last week.   + fetal movements Denies leaking of fluid or vaginal bleeding   OB History    Gravida Para Term Preterm AB Living   2 1 1     1    SAB TAB Ectopic Multiple Live Births           1      Past Medical History:  Diagnosis Date  . Depression   . Gestational diabetes   . Headache   . Infection    UTI    Past Surgical History:  Procedure Laterality Date  . CHOLECYSTECTOMY, LAPAROSCOPIC  2014    Family History  Problem Relation Age of Onset  . Cancer Mother     pancreatic  . Migraines Mother   . Mental illness Father   . Epilepsy Father   . Migraines Father   . Hypertension Father     Social History  Substance Use Topics  . Smoking status: Former Smoker    Packs/day: 0.50    Years: 10.00    Types: Cigarettes    Quit date: 03/20/2016  . Smokeless tobacco: Never Used  . Alcohol use No    Allergies: No Known Allergies  Prescriptions Prior to Admission  Medication Sig Dispense Refill Last Dose  . acetaminophen (TYLENOL) 500 MG tablet Take 500 mg by mouth every 6 (six) hours as needed for headache.   04/09/2016 at Unknown time  . butalbital-acetaminophen-caffeine (FIORICET, ESGIC) 50-325-40 MG tablet Take 1 tablet by mouth every 6 (six) hours as needed for headache. 30 tablet 0   . FLUoxetine (PROZAC) 40 MG capsule Take 40 mg by mouth at bedtime.    04/09/2016 at Unknown time  . Prenatal Vit-Fe Fumarate-FA (PRENATAL MULTIVITAMIN) TABS  tablet Take 1 tablet by mouth daily at 12 noon. (Patient taking differently: Take 1 tablet by mouth at bedtime. ) 30 tablet 3 04/09/2016 at Unknown time  . promethazine (PHENERGAN) 25 MG tablet Take 1 tablet (25 mg total) by mouth every 8 (eight) hours as needed for nausea or vomiting. (Patient not taking: Reported on 04/10/2016) 15 tablet 0 Not Taking at Unknown time  . traZODone (DESYREL) 100 MG tablet Take 1 tablet (100 mg total) by mouth at bedtime as needed for sleep. (Patient not taking: Reported on 04/10/2016) 30 tablet 3 Not Taking at Unknown time   Results for orders placed or performed during the hospital encounter of 04/18/16 (from the past 24 hour(s))  Rapid strep screen     Status: None   Collection Time: 04/18/16 11:38 AM  Result Value Ref Range   Streptococcus, Group A Screen (Direct) NEGATIVE NEGATIVE    Review of Systems  Constitutional: Negative for chills and fever.  HENT: Positive for congestion and sore throat. Negative for ear pain.   Neurological: Positive for headaches.   Physical Exam   Blood pressure 121/71, pulse 94, temperature 98.3 F (36.8  C), temperature source Oral, resp. rate 18, last menstrual period 10/23/2015, SpO2 99 %.  Physical Exam  Constitutional: She is oriented to person, place, and time. She appears well-developed and well-nourished. No distress.  HENT:  Head: Normocephalic.  Mouth/Throat: Mucous membranes are normal. Posterior oropharyngeal erythema present. No oropharyngeal exudate, posterior oropharyngeal edema or tonsillar abscesses.  Eyes: Pupils are equal, round, and reactive to light.  Cardiovascular: Normal rate.   Respiratory: Effort normal and breath sounds normal. No respiratory distress. She has no wheezes. She has no rales. She exhibits no tenderness.  Musculoskeletal: Normal range of motion.  Neurological: She is alert and oriented to person, place, and time.  Skin: Skin is warm. She is not diaphoretic.  Psychiatric: Her behavior  is normal.   Fetal Tracing: Baseline: 130 bpm  Variability: moderate  Accelerations: 10x10 Decelerations: quick variables  Toco: none   MAU Course  Procedures  None  MDM Rapid strep swab negative. Attempted to call patient with results: unable to leave message   Assessment and Plan   A:  1. Cough   2. Sore throat   3. Upper respiratory virus     P:  Discharge home in stable condition Cool mist humidifier Rx: Tesslon Go to Urgent care if URI symptoms worsen  Continue Fioricet as needed   Duane LopeJennifer I Jenika Chiem, NP 04/18/2016 4:58 PM

## 2016-04-18 NOTE — MAU Note (Signed)
Urine in lab 

## 2016-04-21 LAB — CULTURE, GROUP A STREP (THRC)

## 2016-04-24 ENCOUNTER — Encounter: Payer: Self-pay | Admitting: Advanced Practice Midwife

## 2016-04-24 ENCOUNTER — Ambulatory Visit (INDEPENDENT_AMBULATORY_CARE_PROVIDER_SITE_OTHER): Payer: Medicaid Other | Admitting: Advanced Practice Midwife

## 2016-04-24 VITALS — BP 131/80 | HR 120 | Wt 256.8 lb

## 2016-04-24 DIAGNOSIS — O26893 Other specified pregnancy related conditions, third trimester: Secondary | ICD-10-CM

## 2016-04-24 DIAGNOSIS — R51 Headache: Secondary | ICD-10-CM

## 2016-04-24 DIAGNOSIS — O24419 Gestational diabetes mellitus in pregnancy, unspecified control: Secondary | ICD-10-CM | POA: Insufficient documentation

## 2016-04-24 DIAGNOSIS — R519 Headache, unspecified: Secondary | ICD-10-CM

## 2016-04-24 DIAGNOSIS — O24410 Gestational diabetes mellitus in pregnancy, diet controlled: Secondary | ICD-10-CM

## 2016-04-24 MED ORDER — ACCU-CHEK AVIVA PLUS W/DEVICE KIT: 1.0000 | PACK | Freq: Once | 0 refills | Status: AC

## 2016-04-24 MED ORDER — ACCU-CHEK SOFTCLIX LANCET DEV MISC: 2 refills | Status: DC

## 2016-04-24 MED ORDER — BUTALBITAL-APAP-CAFFEINE 50-325-40 MG PO TABS: 1.0000 | ORAL_TABLET | Freq: Four times a day (QID) | ORAL | 1 refills | Status: DC | PRN

## 2016-04-24 MED ORDER — GLUCOSE BLOOD VI STRP: ORAL_STRIP | 12 refills | Status: DC

## 2016-04-24 NOTE — Patient Instructions (Signed)
Gestational Diabetes Mellitus  Gestational diabetes mellitus, often simply referred to as gestational diabetes, is a type of diabetes that some women develop during pregnancy. In gestational diabetes, the pancreas does not make enough insulin (a hormone), the cells are less responsive to the insulin that is made (insulin resistance), or both. Normally, insulin moves sugars from food into the tissue cells. The tissue cells use the sugars for energy. The lack of insulin or the lack of normal response to insulin causes excess sugars to build up in the blood instead of going into the tissue cells. As a result, high blood sugar (hyperglycemia) develops. The effect of high sugar (glucose) levels can cause many problems.   RISK FACTORS  You have an increased chance of developing gestational diabetes if you have a family history of diabetes and also have one or more of the following risk factors:  · A body mass index over 30 (obesity).  · A previous pregnancy with gestational diabetes.  · An older age at the time of pregnancy.  If blood glucose levels are kept in the normal range during pregnancy, women can have a healthy pregnancy. If your blood glucose levels are not well controlled, there may be risks to you, your unborn baby (fetus), your labor and delivery, or your newborn baby.   SYMPTOMS   If symptoms are experienced, they are much like symptoms you would normally expect during pregnancy. The symptoms of gestational diabetes include:   · Increased thirst (polydipsia).  · Increased urination (polyuria).  · Increased urination during the night (nocturia).  · Weight loss. This weight loss may be rapid.  · Frequent, recurring infections.  · Tiredness (fatigue).  · Weakness.  · Vision changes, such as blurred vision.  · Fruity smell to your breath.  · Abdominal pain.  DIAGNOSIS  Diabetes is diagnosed when blood glucose levels are increased. Your blood glucose level may be checked by one or more of the following blood  tests:  · A fasting blood glucose test. You will not be allowed to eat for at least 8 hours before a blood sample is taken.  · A random blood glucose test. Your blood glucose is checked at any time of the day regardless of when you ate.  · An oral glucose tolerance test (OGTT). Your blood glucose is measured after you have not eaten (fasted) for 1-3 hours and then after you drink a glucose-containing beverage. Since the hormones that cause insulin resistance are highest at about 24-28 weeks of a pregnancy, an OGTT is usually performed during that time. If you have risk factors, you may be screened for undiagnosed type 2 diabetes at your first prenatal visit.  TREATMENT   Gestational diabetes should be managed first with diet and exercise. Medicines may be added only if they are needed.  · You will need to take diabetes medicine or insulin daily to keep blood glucose levels in the desired range.  · You will need to match insulin dosing with exercise and healthy food choices.  If you have gestational diabetes, your treatment goal is to maintain the following blood glucose levels:  · Before meals (preprandial): at or below 95 mg/dL.  · After meals (postprandial):    One hour after a meal: at or below 140 mg/dL.    Two hours after a meal: at or below 120 mg/dL.  If you have pre-existing type 1 or type 2 diabetes, your treatment goal is to maintain the following blood glucose levels:  · Before   meals, at bedtime, and overnight: 60-99 mg/dL.  · After meals: peak of 100-129 mg/dL.  HOME CARE INSTRUCTIONS   · Have your hemoglobin A1c level checked twice a year.  · Perform daily blood glucose monitoring as directed by your health care provider. It is common to perform frequent blood glucose monitoring.  · Monitor urine ketones when you are ill and as directed by your health care provider.  · Take your diabetes medicine and insulin as directed by your health care provider to maintain your blood glucose level in the desired  range.  ¨ Never run out of diabetes medicine or insulin. It is needed every day.  ¨ Adjust insulin based on your intake of carbohydrates. Carbohydrates can raise blood glucose levels but need to be included in your diet. Carbohydrates provide vitamins, minerals, and fiber which are an essential part of a healthy diet. Carbohydrates are found in fruits, vegetables, whole grains, dairy products, legumes, and foods containing added sugars.  · Eat healthy foods. Alternate 3 meals with 3 snacks.  · Maintain a healthy weight gain. The usual total expected weight gain varies according to your prepregnancy body mass index (BMI).  · Carry a medical alert card or wear your medical alert jewelry.  · Carry a 15-gram carbohydrate snack with you at all times to treat low blood glucose (hypoglycemia). Some examples of 15-gram carbohydrate snacks include:  ¨ Glucose tablets, 3 or 4.  ¨ Glucose gel, 15-gram tube.  ¨ Raisins, 2 tablespoons (24 g).  ¨ Jelly beans, 6.  ¨ Animal crackers, 8.  ¨ Fruit juice, regular soda, or low-fat milk, 4 ounces (120 mL).  ¨ Gummy treats, 9.  · Recognize hypoglycemia. Hypoglycemia during pregnancy occurs with blood glucose levels of 60 mg/dL and below. The risk for hypoglycemia increases when fasting or skipping meals, during or after intense exercise, and during sleep. Hypoglycemia symptoms can include:  ¨ Tremors or shakes.  ¨ Decreased ability to concentrate.  ¨ Sweating.  ¨ Increased heart rate.  ¨ Headache.  ¨ Dry mouth.  ¨ Hunger.  ¨ Irritability.  ¨ Anxiety.  ¨ Restless sleep.  ¨ Altered speech or coordination.  ¨ Confusion.  · Treat hypoglycemia promptly. If you are alert and able to safely swallow, follow the 15:15 rule:  ¨ Take 15-20 grams of rapid-acting glucose or carbohydrate. Rapid-acting options include glucose gel, glucose tablets, or 4 ounces (120 mL) of fruit juice, regular soda, or low-fat milk.  ¨ Check your blood glucose level 15 minutes after taking the glucose.  ¨ Take 15-20  grams more of glucose if the repeat blood glucose level is still 70 mg/dL or below.  ¨ Eat a meal or snack within 1 hour once blood glucose levels return to normal.  · Be alert to polyuria (excess urination) and polydipsia (excess thirst) which are early signs of hyperglycemia. An early awareness of hyperglycemia allows for prompt treatment. Treat hyperglycemia as directed by your health care provider.  · Engage in at least 30 minutes of physical activity a day or as directed by your health care provider. Ten minutes of physical activity timed 30 minutes after each meal is encouraged to control postprandial blood glucose levels.  · Adjust your insulin dosing and food intake as needed if you start a new exercise or sport.  · Follow your sick-day plan at any time you are unable to eat or drink as usual.  · Avoid tobacco and alcohol use.  · Keep all follow-up visits as directed   by your health care provider.  · Follow the advice of your health care provider regarding your prenatal and post-delivery (postpartum) appointments, meal planning, exercise, medicines, vitamins, blood tests, other medical tests, and physical activities.  · Perform daily skin and foot care. Examine your skin and feet daily for cuts, bruises, redness, nail problems, bleeding, blisters, or sores.  · Brush your teeth and gums at least twice a day and floss at least once a day. Follow up with your dentist regularly.  · Schedule an eye exam during the first trimester of your pregnancy or as directed by your health care provider.  · Share your diabetes management plan with your workplace or school.  · Stay up-to-date with immunizations.  · Learn to manage stress.  · Obtain ongoing diabetes education and support as needed.  · Learn about and consider breastfeeding your baby.  · You should have your blood sugar level checked 6-12 weeks after delivery. This is done with an oral glucose tolerance test (OGTT).  SEEK MEDICAL CARE IF:   · You are unable to  eat food or drink fluids for more than 6 hours.  · You have nausea and vomiting for more than 6 hours.  · You have a blood glucose level of 200 mg/dL and you have ketones in your urine.  · There is a change in mental status.  · You develop vision problems.  · You have a persistent headache.  · You have upper abdominal pain or discomfort.  · You develop an additional serious illness.  · You have diarrhea for more than 6 hours.  · You have been sick or have had a fever for a couple of days and are not getting better.  SEEK IMMEDIATE MEDICAL CARE IF:   · You have difficulty breathing.  · You no longer feel the baby moving.  · You are bleeding or have discharge from your vagina.  · You start having premature contractions or labor.  MAKE SURE YOU:  · Understand these instructions.  · Will watch your condition.  · Will get help right away if you are not doing well or get worse.     This information is not intended to replace advice given to you by your health care provider. Make sure you discuss any questions you have with your health care provider.     Document Released: 10/15/2000 Document Revised: 07/30/2014 Document Reviewed: 02/05/2012  Elsevier Interactive Patient Education ©2016 [employer]

## 2016-04-24 NOTE — Progress Notes (Signed)
   PRENATAL VISIT NOTE  Subjective:  Cheryl Holt is a 25 y.o. G2P1001 at 1160w2d being seen today for ongoing prenatal care.  She is currently monitored for the following issues for this high-risk pregnancy and has Major depressive disorder, recurrent severe without psychotic features (HCC); Abdominal pregnancy with intrauterine pregnancy; Cannabis use disorder, moderate, dependence (HCC); Sedative, hypnotic or anxiolytic use disorder, mild, abuse; Tobacco use disorder; Opioid use disorder, severe, in sustained remission (HCC); Insufficient prenatal care in second trimester; Abdominal pain in pregnancy; Substance abuse affecting pregnancy in second trimester, antepartum; Anxiety and depression; Supervision of normal pregnancy; History of gestational diabetes mellitus; Rh negative status during pregnancy; GBS bacteriuria; and Gestational diabetes on her problem list.  Patient reports daily headaches, resolved with Fioricet.  Contractions: Not present. Vag. Bleeding: None.  Movement: Present. Denies leaking of fluid.   The following portions of the patient's history were reviewed and updated as appropriate: allergies, current medications, past family history, past medical history, past social history, past surgical history and problem list. Problem list updated.  Objective:   Vitals:   04/24/16 1136  BP: 131/80  Pulse: (!) 120  Weight: 256 lb 12.8 oz (116.5 kg)    Fetal Status: Fetal Heart Rate (bpm): 132   Movement: Present     General:  Alert, oriented and cooperative. Patient is in no acute distress.  Skin: Skin is warm and dry. No rash noted.   Cardiovascular: Normal heart rate noted  Respiratory: Normal respiratory effort, no problems with respiration noted  Abdomen: Soft, gravid, appropriate for gestational age. Pain/Pressure: Absent     Pelvic:  Cervical exam deferred        Extremities: Normal range of motion.  Edema: Trace  Mental Status: Normal mood and affect. Normal behavior.  Normal judgment and thought content.   Urinalysis:      Assessment and Plan:  Pregnancy: G2P1001 at 4160w2d  1. Diet controlled gestational diabetes mellitus (GDM) in second trimester --Pt had GDM in previous pregnancy and failed 1 hour glucose. She desires to do glucose testing and not take 3 hour test this pregnancy.  Ordered meter and supplies. Pt needs to be scheduled for diabetic education.  2. Headache in pregnancy, antepartum, third trimester --Schedule visit with Nada MaclachlanKaren Teague Clark for headache management. - butalbital-acetaminophen-caffeine (FIORICET, ESGIC) 50-325-40 MG tablet; Take 1 tablet by mouth every 6 (six) hours as needed for headache.  Dispense: 30 tablet; Refill: 1  Preterm labor symptoms and general obstetric precautions including but not limited to vaginal bleeding, contractions, leaking of fluid and fetal movement were reviewed in detail with the patient. Please refer to After Visit Summary for other counseling recommendations.  Return in about 2 weeks (around 05/08/2016).  Hurshel PartyLisa A Leftwich-Kirby, CNM

## 2016-05-05 ENCOUNTER — Encounter (HOSPITAL_COMMUNITY): Payer: Self-pay | Admitting: *Deleted

## 2016-05-05 ENCOUNTER — Inpatient Hospital Stay (HOSPITAL_COMMUNITY)
Admission: AD | Admit: 2016-05-05 | Discharge: 2016-05-05 | Disposition: A | Discharge status: Home / Self Care | Payer: Medicaid Other | Source: Ambulatory Visit | Attending: Obstetrics and Gynecology | Admitting: Obstetrics and Gynecology

## 2016-05-05 DIAGNOSIS — O24419 Gestational diabetes mellitus in pregnancy, unspecified control: Secondary | ICD-10-CM | POA: Diagnosis not present

## 2016-05-05 DIAGNOSIS — G43909 Migraine, unspecified, not intractable, without status migrainosus: Secondary | ICD-10-CM | POA: Diagnosis not present

## 2016-05-05 DIAGNOSIS — Z87891 Personal history of nicotine dependence: Secondary | ICD-10-CM | POA: Diagnosis not present

## 2016-05-05 DIAGNOSIS — O9989 Other specified diseases and conditions complicating pregnancy, childbirth and the puerperium: Secondary | ICD-10-CM

## 2016-05-05 DIAGNOSIS — O26892 Other specified pregnancy related conditions, second trimester: Secondary | ICD-10-CM | POA: Diagnosis not present

## 2016-05-05 DIAGNOSIS — K047 Periapical abscess without sinus: Secondary | ICD-10-CM | POA: Diagnosis not present

## 2016-05-05 DIAGNOSIS — Z3A27 27 weeks gestation of pregnancy: Secondary | ICD-10-CM | POA: Diagnosis not present

## 2016-05-05 LAB — URINALYSIS, ROUTINE W REFLEX MICROSCOPIC
BILIRUBIN URINE: NEGATIVE
Glucose, UA: 100 mg/dL — AB
Hgb urine dipstick: NEGATIVE
Ketones, ur: NEGATIVE mg/dL
NITRITE: NEGATIVE
PROTEIN: NEGATIVE mg/dL
pH: 6.5 (ref 5.0–8.0)

## 2016-05-05 LAB — URINE MICROSCOPIC-ADD ON

## 2016-05-05 MED ORDER — AMOXICILLIN 500 MG PO CAPS: 500.0000 mg | ORAL_CAPSULE | Freq: Three times a day (TID) | ORAL | 0 refills | Status: AC

## 2016-05-05 MED ORDER — ACETAMINOPHEN-CODEINE #3 300-30 MG PO TABS
1.0000 | ORAL_TABLET | Freq: Once | ORAL | Status: AC
  Administered 2016-05-05: 1 via ORAL
  Filled 2016-05-05: qty 1

## 2016-05-05 MED ORDER — ACETAMINOPHEN-CODEINE #3 300-30 MG PO TABS: 1.0000 | ORAL_TABLET | ORAL | 0 refills | Status: DC | PRN

## 2016-05-05 MED ORDER — AMOXICILLIN 500 MG PO CAPS
1000.0000 mg | ORAL_CAPSULE | Freq: Once | ORAL | Status: AC
  Administered 2016-05-05: 1000 mg via ORAL
  Filled 2016-05-05: qty 2

## 2016-05-05 NOTE — MAU Note (Signed)
Patient presents to MAU stating "I think my tooth is infected and tylenol isn't working."  Pt had tooth pulled a few days ago and pain isn't controlled with tylenol and patient unsure if she can take any other medications for pain.  Also thinks she may have had a fever, but has not checked her temperature.

## 2016-05-05 NOTE — MAU Provider Note (Signed)
Chief Complaint:  tooth infection   HPI: Cheryl Holt is a 25 y.o. G2P1001 at 3654w6d who presents to maternity admissions reporting tooth infection.  Patient just recently (2 days ago) had her tooth extracted on her left tooth #12. Since then she has had increased pain starting last night, only taking Tylenol without relief. Has pain in adjacent teeth and gums, that radiates up to maxillary sinus. Denies fevers or chills. Was not given any antibiotics or pain medicine besides Tylenol.  Denies contractions, leakage of fluid or vaginal bleeding. Good fetal movement.   Pregnancy Course:  Chronic migraines; Gestational diabetes  Past Medical History: Past Medical History:  Diagnosis Date  . Depression   . Gestational diabetes   . Headache   . Infection    UTI    Past obstetric history: OB History  Gravida Para Term Preterm AB Living  2 1 1     1   SAB TAB Ectopic Multiple Live Births          1    # Outcome Date GA Lbr Len/2nd Weight Sex Delivery Anes PTL Lv  2 Current           1 Term 09/23/11    M Vag-Spont EPI N LIV      Past Surgical History: Past Surgical History:  Procedure Laterality Date  . CHOLECYSTECTOMY, LAPAROSCOPIC  2014     Family History: Family History  Problem Relation Age of Onset  . Cancer Mother     pancreatic  . Migraines Mother   . Mental illness Father   . Epilepsy Father   . Migraines Father   . Hypertension Father     Social History: Social History  Substance Use Topics  . Smoking status: Former Smoker    Packs/day: 0.50    Years: 10.00    Types: Cigarettes    Quit date: 03/20/2016  . Smokeless tobacco: Never Used  . Alcohol use No    Allergies: No Known Allergies  Meds:  Prescriptions Prior to Admission  Medication Sig Dispense Refill Last Dose  . acetaminophen (TYLENOL) 500 MG tablet Take 500 mg by mouth every 6 (six) hours as needed for headache.   Past Week at Unknown time  . butalbital-acetaminophen-caffeine (FIORICET,  ESGIC) 50-325-40 MG tablet Take 1 tablet by mouth every 6 (six) hours as needed for headache. 30 tablet 1 Past Week at Unknown time  . FLUoxetine (PROZAC) 40 MG capsule Take 40 mg by mouth at bedtime.    05/04/2016 at Unknown time  . Prenatal Vit-Fe Fumarate-FA (PRENATAL MULTIVITAMIN) TABS tablet Take 1 tablet by mouth daily at 12 noon. (Patient taking differently: Take 1 tablet by mouth at bedtime. ) 30 tablet 3 05/04/2016 at Unknown time  . benzonatate (TESSALON) 100 MG capsule Take 1 capsule (100 mg total) by mouth every 8 (eight) hours. (Patient not taking: Reported on 05/05/2016) 21 capsule 0 Not Taking at Unknown time  . glucose blood test strip Use as instructed 100 each 12   . Lancet Devices (ACCU-CHEK SOFTCLIX) lancets Use as instructed 1 each 2     I have reviewed patient's Past Medical Hx, Surgical Hx, Family Hx, Social Hx, medications and allergies.   ROS:  A comprehensive ROS was negative except per HPI.    Physical Exam  Patient Vitals for the past 24 hrs:  BP Temp Temp src Pulse Resp SpO2  05/05/16 1148 114/65 98.1 F (36.7 C) Oral 92 18 100 %   Constitutional: Well-developed, well-nourished female in  no acute distress.  HEENT: NT/AC; Recent extraction of tooth #12, socket looks infected with pus, tender to palpation at gumline and adjacent teeth on palpation. Cardiovascular: normal rate, no murmurs Respiratory: normal effort, CTAB GI: Abd soft, non-tender, gravid appropriate for gestational age. Pos BS x 4 MS: Extremities nontender, no edema, normal ROM Neurologic: Alert and oriented x 4.  GU: Neg CVAT.   FHT:  Baseline 130, moderate variability, accelerations present, no decelerations Contractions: Quiet   Labs: Results for orders placed or performed during the hospital encounter of 05/05/16 (from the past 24 hour(s))  Urinalysis, Routine w reflex microscopic (not at [facility])     Status: Abnormal   Collection Time: 05/05/16 11:40 AM  Result Value Ref Range   Color,  Urine YELLOW YELLOW   APPearance CLEAR CLEAR   Specific Gravity, Urine <1.005 (L) 1.005 - 1.030   pH 6.5 5.0 - 8.0   Glucose, UA 100 (A) NEGATIVE mg/dL   Hgb urine dipstick NEGATIVE NEGATIVE   Bilirubin Urine NEGATIVE NEGATIVE   Ketones, ur NEGATIVE NEGATIVE mg/dL   Protein, ur NEGATIVE NEGATIVE mg/dL   Nitrite NEGATIVE NEGATIVE   Leukocytes, UA SMALL (A) NEGATIVE  Urine microscopic-add on     Status: Abnormal   Collection Time: 05/05/16 11:40 AM  Result Value Ref Range   Squamous Epithelial / LPF 0-5 (A) NONE SEEN   WBC, UA 0-5 0 - 5 WBC/hpf   RBC / HPF 0-5 0 - 5 RBC/hpf   Bacteria, UA FEW (A) NONE SEEN   I personally reviewed the patient's NST today, found to be REACTIVE. 130 bpm, mod var, +accels, no decels. CTX: None  Imaging:  No results found.  MAU Course: Started Amoxicillin Given PO pain meds NST - reactive  MDM: Plan of care reviewed with patient, including labs and tests ordered and medical treatment. Discussed importance for patient to follow up with dentist on Monday.   Assessment: 1. Tooth abscess     Plan: Discharge home in stable condition.  Preterm Labor precautions and fetal kick counts. Pain meds and antibiotics for abscess Follow up with Dentist on Monday, call their emergency line if develops fevers      Medication List    STOP taking these medications   benzonatate 100 MG capsule Commonly known as:  TESSALON     TAKE these medications   accu-chek softclix lancets Use as instructed   acetaminophen 500 MG tablet Commonly known as:  TYLENOL Take 500 mg by mouth every 6 (six) hours as needed for headache.   acetaminophen-codeine 300-30 MG tablet Commonly known as:  TYLENOL #3 Take 1 tablet by mouth every 4 (four) hours as needed for moderate pain (Do not exceed 3000mg  of Tylenol per day).   amoxicillin 500 MG capsule Commonly known as:  AMOXIL Take 1 capsule (500 mg total) by mouth 3 (three) times daily.    butalbital-acetaminophen-caffeine 50-325-40 MG tablet Commonly known as:  FIORICET, ESGIC Take 1 tablet by mouth every 6 (six) hours as needed for headache.   FLUoxetine 40 MG capsule Commonly known as:  PROZAC Take 40 mg by mouth at bedtime.   glucose blood test strip Use as instructed   prenatal multivitamin Tabs tablet Take 1 tablet by mouth daily at 12 noon. What changed:  when to take this       Hiram Comber [facility], DO 05/05/2016 12:16 PM

## 2016-05-05 NOTE — Progress Notes (Signed)
Medications given and printed AVS/Rx's discussed with patient who verbalizes understanding.  Patient will wait in waiting room for after Amoxicillin dose before discharge.  Patient agrees. DC'd to home in good condition.

## 2016-05-05 NOTE — Discharge Instructions (Signed)
Dental Abscess A dental abscess is pus in or around a tooth. HOME CARE  Take medicines only as told by your dentist.  If you were prescribed antibiotic medicine, finish all of it even if you start to feel better.  Rinse your mouth (gargle) often with salt water.  Do not drive or use heavy machinery, like a lawn mower, while taking pain medicine.  Do not apply heat to the outside of your mouth.  Keep all follow-up visits as told by your dentist. This is important. GET HELP IF:  Your pain is worse, and medicine does not help. GET HELP RIGHT AWAY IF:  You have a fever or chills.  Your symptoms suddenly get worse.  You have a very bad headache.  You have problems breathing or swallowing.  You have trouble opening your mouth.  You have puffiness (swelling) in your neck or around your eye.   This information is not intended to replace advice given to you by your health care provider. Make sure you discuss any questions you have with your health care provider.   Document Released: 11/23/2014 Document Reviewed: 11/23/2014 Elsevier Interactive Patient Education 2016 Elsevier Inc.  

## 2016-05-10 ENCOUNTER — Ambulatory Visit (INDEPENDENT_AMBULATORY_CARE_PROVIDER_SITE_OTHER): Payer: Medicaid Other | Admitting: Obstetrics and Gynecology

## 2016-05-10 VITALS — BP 124/74 | HR 95 | Wt 260.3 lb

## 2016-05-10 DIAGNOSIS — Z23 Encounter for immunization: Secondary | ICD-10-CM

## 2016-05-10 DIAGNOSIS — B373 Candidiasis of vulva and vagina: Secondary | ICD-10-CM | POA: Diagnosis not present

## 2016-05-10 DIAGNOSIS — O360930 Maternal care for other rhesus isoimmunization, third trimester, not applicable or unspecified: Secondary | ICD-10-CM | POA: Diagnosis not present

## 2016-05-10 DIAGNOSIS — Z6791 Unspecified blood type, Rh negative: Secondary | ICD-10-CM

## 2016-05-10 DIAGNOSIS — O99210 Obesity complicating pregnancy, unspecified trimester: Secondary | ICD-10-CM

## 2016-05-10 DIAGNOSIS — O26892 Other specified pregnancy related conditions, second trimester: Secondary | ICD-10-CM

## 2016-05-10 DIAGNOSIS — O99213 Obesity complicating pregnancy, third trimester: Secondary | ICD-10-CM | POA: Diagnosis not present

## 2016-05-10 DIAGNOSIS — O98813 Other maternal infectious and parasitic diseases complicating pregnancy, third trimester: Secondary | ICD-10-CM

## 2016-05-10 DIAGNOSIS — B3731 Acute candidiasis of vulva and vagina: Secondary | ICD-10-CM

## 2016-05-10 DIAGNOSIS — E669 Obesity, unspecified: Secondary | ICD-10-CM | POA: Diagnosis not present

## 2016-05-10 DIAGNOSIS — O0992 Supervision of high risk pregnancy, unspecified, second trimester: Secondary | ICD-10-CM

## 2016-05-10 LAB — CBC
HCT: 35 % (ref 35.0–45.0)
Hemoglobin: 12.2 g/dL (ref 11.7–15.5)
MCH: 31.2 pg (ref 27.0–33.0)
MCHC: 34.9 g/dL (ref 32.0–36.0)
MCV: 89.5 fL (ref 80.0–100.0)
MPV: 10 fL (ref 7.5–12.5)
Platelets: 292 10*3/uL (ref 140–400)
RBC: 3.91 MIL/uL (ref 3.80–5.10)
RDW: 12.5 % (ref 11.0–15.0)
WBC: 8.4 10*3/uL (ref 3.8–10.8)

## 2016-05-10 LAB — POCT URINALYSIS DIP (DEVICE)
BILIRUBIN URINE: NEGATIVE
Glucose, UA: NEGATIVE mg/dL
HGB URINE DIPSTICK: NEGATIVE
KETONES UR: NEGATIVE mg/dL
Leukocytes, UA: NEGATIVE
Nitrite: NEGATIVE
PH: 7 (ref 5.0–8.0)
PROTEIN: NEGATIVE mg/dL
SPECIFIC GRAVITY, URINE: 1.02 (ref 1.005–1.030)
Urobilinogen, UA: 0.2 mg/dL (ref 0.0–1.0)

## 2016-05-10 LAB — HIV ANTIBODY (ROUTINE TESTING W REFLEX): HIV: NONREACTIVE

## 2016-05-10 MED ORDER — MICONAZOLE NITRATE 2 % VA CREA: 1.0000 | TOPICAL_CREAM | Freq: Every day | VAGINAL | 0 refills | Status: DC

## 2016-05-10 MED ORDER — RHO D IMMUNE GLOBULIN 1500 UNIT/2ML IJ SOSY
300.0000 ug | PREFILLED_SYRINGE | Freq: Once | INTRAMUSCULAR | Status: AC
  Administered 2016-05-10: 300 ug via INTRAMUSCULAR

## 2016-05-10 NOTE — Progress Notes (Signed)
Prenatal Visit Note Date: 05/10/2016 [facility]: [facility]  Subjective:  Cheryl ScotBethany Holt is a 25 y.o. G2P1001 at 779w4d being seen today for ongoing prenatal care.  She is currently monitored for the following issues for this high-risk pregnancy and has Major depressive disorder, recurrent severe without psychotic features (HCC); Cannabis use disorder, moderate, dependence (HCC); Sedative, hypnotic or anxiolytic use disorder, mild, abuse; Tobacco use disorder; Opioid use disorder, severe, in sustained remission (HCC); Insufficient prenatal care in second trimester; Substance abuse affecting pregnancy in second trimester, antepartum; Anxiety and depression; Supervision of normal pregnancy; History of gestational diabetes mellitus; Rh negative status during pregnancy; GBS bacteriuria; and Gestational diabetes on her problem list.  Patient reports vulvar itching. Feels like yeast infection.  Contractions: Not present. Vag. Bleeding: None.  Movement: Present. Denies leaking of fluid.   The following portions of the patient's history were reviewed and updated as appropriate: allergies, current medications, past family history, past medical history, past social history, past surgical history and problem list. Problem list updated.  Objective:   Vitals:   05/10/16 1136  BP: 124/74  Pulse: 95  Weight: 260 lb 4.8 oz (118.1 kg)    Fetal Status: Fetal Heart Rate (bpm): 135 Fundal Height: 30 cm Movement: Present     General:  Alert, oriented and cooperative. Patient is in no acute distress.  Skin: Skin is warm and dry. No rash noted.   Cardiovascular: Normal heart rate noted  Respiratory: Normal respiratory effort, no problems with respiration noted  Abdomen: Soft, gravid, appropriate for gestational age. Pain/Pressure: Absent     Pelvic:  EGBUS with bilateral mild erythema on the vulva and white cottage cheese like d/c at the introitus  Extremities: Normal range of motion.  Edema:  Trace  Mental Status: Normal mood and affect. Normal behavior. Normal judgment and thought content.   Urinalysis:      Assessment and Plan:  Pregnancy: G2P1001 at 2279w4d  1. Supervision of high risk pregnancy, antepartum, second trimester Routine care.  - HIV antibody (with reflex) - CBC - RPR  2. Need for Tdap vaccination See above - Tdap vaccine greater than or equal to 7yo IM  3. Rh negative status during pregnancy in second trimester Type and screen today too - rho (d) immune globulin (RHIG/RHOPHYLAC) injection 300 mcg; Inject 2 mLs (300 mcg total) into the muscle once.  4. GDM Patient didn't bring log book and hasn't been checking BS for past two days and has only been doing 1hr PP 2x/day. Stress importance of checking for M-F health and to do am fasting and 1 or 2h PP checks. Will check a1c today. May need to start ap surveillance at 32wks if pt not being compliant   5. Vulvovaginal candidiasis Monistat 7 Rx give  6. Social Mood normal on prozac 50 qday Lives at shelter and no problems or issues   Preterm labor symptoms and general obstetric precautions including but not limited to vaginal bleeding, contractions, leaking of fluid and fetal movement were reviewed in detail with the patient. Please refer to After Visit Summary for other counseling recommendations.  7-10d for BS log check and rob   [place] Bingharlie Trustin Chapa, MD

## 2016-05-10 NOTE — Progress Notes (Signed)
C/o of vaginal itchiness which she associates with starting amoxicillin

## 2016-05-11 ENCOUNTER — Encounter: Payer: Self-pay | Admitting: Obstetrics and Gynecology

## 2016-05-11 DIAGNOSIS — B1920 Unspecified viral hepatitis C without hepatic coma: Secondary | ICD-10-CM | POA: Insufficient documentation

## 2016-05-11 HISTORY — DX: Unspecified viral hepatitis C without hepatic coma: B19.20

## 2016-05-11 LAB — HEPATITIS C ANTIBODY: HCV Ab: REACTIVE — AB

## 2016-05-11 LAB — HEMOGLOBIN A1C
HEMOGLOBIN A1C: 4.8 % (ref <5.7)
MEAN PLASMA GLUCOSE: 91 mg/dL

## 2016-05-11 LAB — ABO AND RH: Rh Type: NEGATIVE

## 2016-05-11 LAB — RPR

## 2016-05-14 ENCOUNTER — Telehealth: Payer: Self-pay | Admitting: Obstetrics and Gynecology

## 2016-05-14 NOTE — Telephone Encounter (Signed)
OB Telephone Note  05/14/2016 @ 1619: pt called at [phone] but no answer and VM not set up yet. Purpose of call was to tell her of new hepC diagnosis.   Cornelia Copaharlie Krishon Adkison, Jr MD Attending [facility](Faculty Practice)

## 2016-05-15 LAB — HEPATITIS C RNA QUANTITATIVE
HCV QUANT: 134895 [IU]/mL — AB (ref <15)
HCV Quantitative Log: 5.13 {Log} — ABNORMAL HIGH (ref <1.18)

## 2016-05-16 ENCOUNTER — Telehealth: Payer: Self-pay | Admitting: Obstetrics and Gynecology

## 2016-05-16 NOTE — Progress Notes (Signed)
Pt also told it's very low risk but still possibility of sexual transmission

## 2016-05-16 NOTE — Telephone Encounter (Signed)
OB Telephone Note  05/16/2016 @ 1207pm: pt called at [phone] and told about hepC diagnosis. She denies any IV drug use, blood transfusion but does state that, in the remote past, she snorted drugs. I told her that we will need to get some extra blood work when we see her in clinic and that she has the same risk of transmission whether VD or c-section so VLs aren't a factor in that and there's no tx in pregnancy that can be done and that we will minimize exposing the fetus to certain procedures like ROM, FSE, forceps, etc. I told her that we will set her up for GI care after she delivers. Her HIV was negative.   I also told her about VM not set up, too.   Cornelia Copaharlie Jrake Rodriquez, Jr MD Attending [facility](Faculty Practice)

## 2016-05-17 ENCOUNTER — Encounter: Payer: Medicaid Other | Admitting: Obstetrics and Gynecology

## 2016-05-24 ENCOUNTER — Ambulatory Visit (INDEPENDENT_AMBULATORY_CARE_PROVIDER_SITE_OTHER): Payer: Medicaid Other | Admitting: Obstetrics and Gynecology

## 2016-05-24 ENCOUNTER — Encounter: Payer: Self-pay | Admitting: Family Medicine

## 2016-05-24 VITALS — BP 117/77 | HR 108 | Wt 256.0 lb

## 2016-05-24 DIAGNOSIS — B1710 Acute hepatitis C without hepatic coma: Secondary | ICD-10-CM

## 2016-05-24 DIAGNOSIS — R8271 Bacteriuria: Secondary | ICD-10-CM

## 2016-05-24 DIAGNOSIS — O26893 Other specified pregnancy related conditions, third trimester: Secondary | ICD-10-CM

## 2016-05-24 DIAGNOSIS — O0993 Supervision of high risk pregnancy, unspecified, third trimester: Secondary | ICD-10-CM

## 2016-05-24 DIAGNOSIS — Z6791 Unspecified blood type, Rh negative: Secondary | ICD-10-CM

## 2016-05-24 DIAGNOSIS — O24410 Gestational diabetes mellitus in pregnancy, diet controlled: Secondary | ICD-10-CM

## 2016-05-24 DIAGNOSIS — O0990 Supervision of high risk pregnancy, unspecified, unspecified trimester: Secondary | ICD-10-CM | POA: Insufficient documentation

## 2016-05-24 MED ORDER — BUTALBITAL-APAP-CAFFEINE 50-325-40 MG PO CAPS: 1.0000 | ORAL_CAPSULE | Freq: Four times a day (QID) | ORAL | 0 refills | Status: DC | PRN

## 2016-05-24 MED ORDER — PROMETHAZINE HCL 25 MG PO TABS: 25.0000 mg | ORAL_TABLET | Freq: Four times a day (QID) | ORAL | 0 refills | Status: DC | PRN

## 2016-05-24 NOTE — Progress Notes (Signed)
Subjective:  Cheryl Holt is a 25 y.o. G2P1001 at 6242w4d being seen today for ongoing prenatal care.  She is currently monitored for the following issues for this high-risk pregnancy and has Major depressive disorder, recurrent severe without psychotic features (HCC); Cannabis use disorder, moderate, dependence (HCC); Sedative, hypnotic or anxiolytic use disorder, mild, abuse; Tobacco use disorder; Opioid use disorder, severe, in sustained remission (HCC); Insufficient prenatal care in second trimester; Substance abuse affecting pregnancy in second trimester, antepartum; Anxiety and depression; History of gestational diabetes mellitus; Rh negative status during pregnancy; GBS bacteriuria; Gestational diabetes; Obesity in pregnancy; Hepatitis C; and Supervision of high-risk pregnancy on her problem list.  Patient reports some N/V and disrrhea over night. Thinks she ate soemthing bad. .  Contractions: Not present. Vag. Bleeding: None.  Movement: Present. Denies leaking of fluid.   The following portions of the patient's history were reviewed and updated as appropriate: allergies, current medications, past family history, past medical history, past social history, past surgical history and problem list. Problem list updated.  Objective:   Vitals:   05/24/16 1414  BP: 117/77  Pulse: (!) 108  Weight: 256 lb (116.1 kg)    Fetal Status: Fetal Heart Rate (bpm): 156   Movement: Present     General:  Alert, oriented and cooperative. Patient is in no acute distress.  Skin: Skin is warm and dry. No rash noted.   Cardiovascular: Normal heart rate noted  Respiratory: Normal respiratory effort, no problems with respiration noted  Abdomen: Soft, gravid, appropriate for gestational age. Pain/Pressure: Absent     Pelvic:  Cervical exam deferred        Extremities: Normal range of motion.  Edema: Trace  Mental Status: Normal mood and affect. Normal behavior. Normal judgment and thought content.   Urinalysis:       Assessment and Plan:  Pregnancy: G2P1001 at 7542w4d  1. Acute hepatitis C virus infection without hepatic coma Reviewed with pt.  - Hepatitis C Genotype - Comprehensive metabolic panel - Fibrinogen - INR/PT - PTT - US MFM OB FOLLOW UP; Future  2. Diet controlled gestational diabetes mellitus (GDM) in third trimester BS in goal range.  3. GBS bacteriuria Treat in labor  4. Rh negative status during pregnancy in third trimester S/P Rhogam  5. Supervision of high risk pregnancy in third trimester   Preterm labor symptoms and general obstetric precautions including but not limited to vaginal bleeding, contractions, leaking of fluid and fetal movement were reviewed in detail with the patient. Please refer to After Visit Summary for other counseling recommendations.  No Follow-up on file.   Hermina StaggersMichael L Bladimir Auman, MD

## 2016-05-24 NOTE — Progress Notes (Signed)
OB f/u US scheduled for November 9th @ 1430.  Pt notified.

## 2016-05-24 NOTE — Addendum Note (Signed)
Addended by: Hermina StaggersERVIN, MICHAEL L on: 05/24/2016 03:05 PM   Modules accepted: Orders

## 2016-05-25 LAB — COMPREHENSIVE METABOLIC PANEL
ALBUMIN: 3.5 g/dL — AB (ref 3.6–5.1)
ALK PHOS: 56 U/L (ref 33–115)
ALT: 25 U/L (ref 6–29)
AST: 18 U/L (ref 10–30)
BILIRUBIN TOTAL: 0.4 mg/dL (ref 0.2–1.2)
BUN: 7 mg/dL (ref 7–25)
CALCIUM: 9.1 mg/dL (ref 8.6–10.2)
CO2: 21 mmol/L (ref 20–31)
Chloride: 104 mmol/L (ref 98–110)
Creat: 0.55 mg/dL (ref 0.50–1.10)
Glucose, Bld: 74 mg/dL (ref 65–99)
POTASSIUM: 4.1 mmol/L (ref 3.5–5.3)
Sodium: 137 mmol/L (ref 135–146)
TOTAL PROTEIN: 5.8 g/dL — AB (ref 6.1–8.1)

## 2016-05-25 LAB — PROTIME-INR
INR: 1
PROTHROMBIN TIME: 10.9 s (ref 9.0–11.5)

## 2016-05-25 LAB — APTT: APTT: 27 s (ref 22–34)

## 2016-05-25 LAB — FIBRINOGEN: FIBRINOGEN: 327 mg/dL (ref 175–425)

## 2016-05-28 LAB — HEPATITIS C GENOTYPE

## 2016-05-31 ENCOUNTER — Ambulatory Visit (HOSPITAL_COMMUNITY): Admission: RE | Admit: 2016-05-31 | Payer: Medicaid Other | Source: Ambulatory Visit

## 2016-06-04 ENCOUNTER — Encounter: Payer: Medicaid Other | Admitting: Obstetrics and Gynecology

## 2016-06-05 ENCOUNTER — Encounter (HOSPITAL_COMMUNITY): Payer: Self-pay | Admitting: *Deleted

## 2016-06-05 ENCOUNTER — Inpatient Hospital Stay (HOSPITAL_COMMUNITY)
Admission: AD | Admit: 2016-06-05 | Discharge: 2016-06-05 | Disposition: A | Discharge status: Home / Self Care | Payer: Medicaid Other | Source: Ambulatory Visit | Attending: Obstetrics and Gynecology | Admitting: Obstetrics and Gynecology

## 2016-06-05 DIAGNOSIS — N949 Unspecified condition associated with female genital organs and menstrual cycle: Secondary | ICD-10-CM

## 2016-06-05 DIAGNOSIS — Z3A30 30 weeks gestation of pregnancy: Secondary | ICD-10-CM | POA: Insufficient documentation

## 2016-06-05 DIAGNOSIS — O4703 False labor before 37 completed weeks of gestation, third trimester: Secondary | ICD-10-CM | POA: Diagnosis present

## 2016-06-05 DIAGNOSIS — O9989 Other specified diseases and conditions complicating pregnancy, childbirth and the puerperium: Secondary | ICD-10-CM

## 2016-06-05 DIAGNOSIS — O24410 Gestational diabetes mellitus in pregnancy, diet controlled: Secondary | ICD-10-CM

## 2016-06-05 LAB — URINALYSIS, ROUTINE W REFLEX MICROSCOPIC
BILIRUBIN URINE: NEGATIVE
Hgb urine dipstick: NEGATIVE
KETONES UR: NEGATIVE mg/dL
LEUKOCYTES UA: NEGATIVE
Nitrite: NEGATIVE
PH: 6 (ref 5.0–8.0)
Protein, ur: NEGATIVE mg/dL
SPECIFIC GRAVITY, URINE: 1.02 (ref 1.005–1.030)

## 2016-06-05 LAB — RAPID URINE DRUG SCREEN, HOSP PERFORMED
Amphetamines: NOT DETECTED
BARBITURATES: POSITIVE — AB
Benzodiazepines: NOT DETECTED
COCAINE: NOT DETECTED
Opiates: NOT DETECTED
TETRAHYDROCANNABINOL: POSITIVE — AB

## 2016-06-05 LAB — GLUCOSE, CAPILLARY: Glucose-Capillary: 116 mg/dL — ABNORMAL HIGH (ref 65–99)

## 2016-06-05 LAB — WET PREP, GENITAL
CLUE CELLS WET PREP: NONE SEEN
SPERM: NONE SEEN
Trich, Wet Prep: NONE SEEN
Yeast Wet Prep HPF POC: NONE SEEN

## 2016-06-05 LAB — URINE MICROSCOPIC-ADD ON

## 2016-06-05 NOTE — Discharge Instructions (Signed)
Round Ligament Pain Introduction The round ligament is a cord of muscle and tissue that helps to support the uterus. It can become a source of pain during pregnancy if it becomes stretched or twisted as the baby grows. The pain usually begins in the second trimester of pregnancy, and it can come and go until the baby is delivered. It is not a serious problem, and it does not cause harm to the baby. Round ligament pain is usually a short, sharp, and pinching pain, but it can also be a dull, lingering, and aching pain. The pain is felt in the lower side of the abdomen or in the groin. It usually starts deep in the groin and moves up to the outside of the hip area. Pain can occur with:  A sudden change in position.  Rolling over in bed.  Coughing or sneezing.  Physical activity. Follow these instructions at home: Watch your condition for any changes. Take these steps to help with your pain:  When the pain starts, relax. Then try:  Sitting down.  Flexing your knees up to your abdomen.  Lying on your side with one pillow under your abdomen and another pillow between your legs.  Sitting in a warm bath for 15-20 minutes or until the pain goes away.  Take over-the-counter and prescription medicines only as told by your health care provider.  Move slowly when you sit and stand.  Avoid long walks if they cause pain.  Stop or lessen your physical activities if they cause pain. Contact a health care provider if:  Your pain does not go away with treatment.  You feel pain in your back that you did not have before.  Your medicine is not helping. Get help right away if:  You develop a fever or chills.  You develop uterine contractions.  You develop vaginal bleeding.  You develop nausea or vomiting.  You develop diarrhea.  You have pain when you urinate. This information is not intended to replace advice given to you by your health care provider. Make sure you discuss any questions  you have with your health care provider. Document Released: 04/17/2008 Document Revised: 12/15/2015 Document Reviewed: 09/15/2014  2017 Elsevier  

## 2016-06-05 NOTE — MAU Note (Signed)
States she is feeling cramping in her abdomen X 2 days. States she has been working long shifts at [employer] and wondering if she is doing too much. Not sure if it is contractions or just "growing pains." No bleeding, normal D/C.

## 2016-06-05 NOTE — MAU Provider Note (Signed)
Chief Complaint:  Abdominal Cramping  HPI: Cheryl Holt is a 25 y.o. G2P1001 at 2438w2d who presents to maternity admissions reporting cramping.  She states for past 2 days, she has been having period-like cramping, not contractions. Happens throughout the entire day, starts for a couple minutes, goes away, and comes back. States some spotting a few days ago, brown discharge, no bright red blood, stopped within the same day. No bleeding today.  Stretching out makes it feel better, and resting. Worse with standing for long periods of time. Not using maternity belt at work. Standing on feet all day for work for about 9 hours, works at [employer]. No heavy lifting. Feels like a pulling sensation from bilateral side down to groin. Requesting a work note for limitations and for not working the past 2 days.   Denies contractions, leakage of fluid or vaginal bleeding. Good fetal movement.   Pregnancy Course:  GDM A1, diet controlled; Hep C (workup with ID this week)  Past Medical History: Past Medical History:  Diagnosis Date  . Depression   . Gestational diabetes   . Headache   . Hepatitis C 05/11/2016   [ ]  f/u quant [ ]  cmp  . Infection    UTI    Past obstetric history: OB History  Gravida Para Term Preterm AB Living  2 1 1     1   SAB TAB Ectopic Multiple Live Births          1    # Outcome Date GA Lbr Len/2nd Weight Sex Delivery Anes PTL Lv  2 Current           1 Term 09/23/11    M Vag-Spont EPI N LIV      Past Surgical History: Past Surgical History:  Procedure Laterality Date  . CHOLECYSTECTOMY, LAPAROSCOPIC  2014     Family History: Family History  Problem Relation Age of Onset  . Cancer Mother     pancreatic  . Migraines Mother   . Mental illness Father   . Epilepsy Father   . Migraines Father   . Hypertension Father     Social History: Social History  Substance Use Topics  . Smoking status: Former Smoker    Packs/day: 0.50    Years: 10.00    Types:  Cigarettes    Quit date: 03/20/2016  . Smokeless tobacco: Never Used  . Alcohol use No    Allergies: No Known Allergies  Meds:  Prescriptions Prior to Admission  Medication Sig Dispense Refill Last Dose  . acetaminophen (TYLENOL) 500 MG tablet Take 500 mg by mouth every 6 (six) hours as needed for headache.   Taking  . acetaminophen-codeine (TYLENOL #3) 300-30 MG tablet take 1 tablet by mouth three times a day if needed  0   . Butalbital-APAP-Caffeine 50-325-40 MG capsule Take 1-2 capsules by mouth every 6 (six) hours as needed for headache. 12 capsule 0   . FLUoxetine (PROZAC) 40 MG capsule Take 40 mg by mouth at bedtime.    Taking  . glucose blood test strip Use as instructed 100 each 12 Taking  . Lancet Devices (ACCU-CHEK SOFTCLIX) lancets Use as instructed 1 each 2 Taking  . miconazole (MONISTAT 7) 2 % vaginal cream Place 1 Applicatorful vaginally at bedtime. 45 g 0   . Prenatal Vit-Fe Fumarate-FA (PRENATAL MULTIVITAMIN) TABS tablet Take 1 tablet by mouth daily at 12 noon. (Patient taking differently: Take 1 tablet by mouth at bedtime. ) 30 tablet 3 Taking  .  promethazine (PHENERGAN) 25 MG tablet Take 1 tablet (25 mg total) by mouth every 6 (six) hours as needed for nausea or vomiting. 12 tablet 0     I have reviewed patient's Past Medical Hx, Surgical Hx, Family Hx, Social Hx, medications and allergies.   ROS:  A comprehensive ROS was negative except per HPI.    Physical Exam  Patient Vitals for the past 24 hrs:  BP Temp Temp src Pulse Resp  06/05/16 0941 137/66 97.2 F (36.2 C) Oral 98 18   Constitutional: Well-developed, well-nourished female in no acute distress.  Cardiovascular: normal rate Respiratory: normal effort GI: Abd soft, non-tender, gravid appropriate for gestational age. Pos BS x 4 MS: Extremities nontender, no edema, normal ROM Neurologic: Alert and oriented x 4.  GU: Neg CVAT. Pelvic: NEFG. Normal physiologic discharge, No bleeding. No pooling. GC/CT  sample taken. SVE: fingertip/50/high.  FHT:  Baseline 130 , moderate variability, accelerations present, no decelerations Contractions: None   Labs: Results for orders placed or performed during the hospital encounter of 06/05/16 (from the past 24 hour(s))  Wet prep, genital     Status: Abnormal   Collection Time: 06/05/16 10:02 AM  Result Value Ref Range   Yeast Wet Prep HPF POC NONE SEEN NONE SEEN   Trich, Wet Prep NONE SEEN NONE SEEN   Clue Cells Wet Prep HPF POC NONE SEEN NONE SEEN   WBC, Wet Prep HPF POC MANY (A) NONE SEEN   Sperm NONE SEEN     Imaging:  No results found.  MAU Course: FFN could not be performed due to recent intercourse in the last 72 hours.  SSE - Normal appearing without blood or pooling SVE - fingertip, thick, high Wet Prep - NEG UA GC/CT - pending NST- REACTIVE  I personally reviewed the patient's NST today, found to be REACTIVE. 140 bpm, mod var, +accels, no decels. CTX: None   MDM: Plan of care reviewed with patient, including labs and tests ordered and medical treatment. Discussed work note for more frequent breaks, especially sitting every 4 hours.    Assessment: 1. Round ligament pain   2. Diet controlled gestational diabetes mellitus (GDM) in third trimester     Plan: Discharge home in stable condition.  Preterm Labor precautions and fetal kick counts No sign of preterm labor      Medication List    TAKE these medications   acetaminophen 325 MG tablet Commonly known as:  TYLENOL Take 650 mg by mouth every 6 (six) hours as needed for mild pain, moderate pain or headache.   FLUoxetine 40 MG capsule Commonly known as:  PROZAC Take 40 mg by mouth at bedtime.   prenatal multivitamin Tabs tablet Take 1 tablet by mouth at bedtime.   promethazine 25 MG tablet Commonly known as:  PHENERGAN Take 1 tablet (25 mg total) by mouth every 6 (six) hours as needed for nausea or vomiting.       Jen MowElizabeth Ziggy Chanthavong, DO OB  Fellow 06/05/2016 10:34 AM

## 2016-06-06 ENCOUNTER — Encounter (HOSPITAL_COMMUNITY): Payer: Self-pay

## 2016-06-06 ENCOUNTER — Other Ambulatory Visit: Payer: Self-pay | Admitting: Obstetrics and Gynecology

## 2016-06-06 ENCOUNTER — Ambulatory Visit (HOSPITAL_COMMUNITY)
Admission: RE | Admit: 2016-06-06 | Discharge: 2016-06-06 | Disposition: A | Discharge status: Home / Self Care | Payer: Medicaid Other | Source: Ambulatory Visit | Attending: Obstetrics and Gynecology | Admitting: Obstetrics and Gynecology

## 2016-06-06 ENCOUNTER — Other Ambulatory Visit (HOSPITAL_COMMUNITY): Payer: Self-pay | Admitting: *Deleted

## 2016-06-06 DIAGNOSIS — O365930 Maternal care for other known or suspected poor fetal growth, third trimester, not applicable or unspecified: Secondary | ICD-10-CM

## 2016-06-06 DIAGNOSIS — O99323 Drug use complicating pregnancy, third trimester: Secondary | ICD-10-CM | POA: Insufficient documentation

## 2016-06-06 DIAGNOSIS — O09293 Supervision of pregnancy with other poor reproductive or obstetric history, third trimester: Secondary | ICD-10-CM | POA: Insufficient documentation

## 2016-06-06 DIAGNOSIS — Z3A30 30 weeks gestation of pregnancy: Secondary | ICD-10-CM | POA: Diagnosis not present

## 2016-06-06 DIAGNOSIS — B1710 Acute hepatitis C without hepatic coma: Secondary | ICD-10-CM

## 2016-06-06 DIAGNOSIS — Z6791 Unspecified blood type, Rh negative: Secondary | ICD-10-CM | POA: Insufficient documentation

## 2016-06-06 DIAGNOSIS — O24410 Gestational diabetes mellitus in pregnancy, diet controlled: Secondary | ICD-10-CM | POA: Diagnosis not present

## 2016-06-06 DIAGNOSIS — O26893 Other specified pregnancy related conditions, third trimester: Secondary | ICD-10-CM | POA: Diagnosis not present

## 2016-06-06 LAB — GC/CHLAMYDIA PROBE AMP (~~LOC~~) NOT AT ARMC
Chlamydia: NEGATIVE
Neisseria Gonorrhea: NEGATIVE

## 2016-06-06 NOTE — Addendum Note (Signed)
Encounter addended by: Earley BrookeNicole S Waylen Depaolo on: 06/06/2016 11:21 AM<BR>    Actions taken: Imaging Exam ended

## 2016-06-06 NOTE — Addendum Note (Signed)
Encounter addended by: Heidi DachMelanie A Robb, RN on: 06/06/2016 11:29 AM<BR>    Actions taken: Charge Capture section accepted, Episode edited, Order Reconciliation Section accessed, Home Medications modified, Medication taking status modified, Visit Navigator Flowsheet section accepted, Prenatal Vitals section updated

## 2016-06-07 ENCOUNTER — Telehealth: Payer: Self-pay | Admitting: General Practice

## 2016-06-07 NOTE — Telephone Encounter (Signed)
Patient called and left message stating she is out of her prozac and has extreme anxiety. Patient states she isn't following up at [facility] until 12/11 and would like at least enough to get her to that appt.

## 2016-06-19 NOTE — Telephone Encounter (Addendum)
Called pt and heard message stating that person called does not have voice mail set up - unable to leave message.  Per records @ [address] [facility] in [place], pt last received Rx for Prozac 20 mg (take 2 tablets daily, # 60) @ end of September.    11/29  1433  Called pt and again heard same message as yesterday.  Per chart review, pt has appt scheduled for today to see Asher MuirJamie.

## 2016-06-20 ENCOUNTER — Other Ambulatory Visit (HOSPITAL_COMMUNITY): Payer: Self-pay | Admitting: Obstetrics and Gynecology

## 2016-06-20 ENCOUNTER — Ambulatory Visit (INDEPENDENT_AMBULATORY_CARE_PROVIDER_SITE_OTHER): Payer: Medicaid Other | Admitting: Obstetrics and Gynecology

## 2016-06-20 ENCOUNTER — Encounter (HOSPITAL_COMMUNITY): Payer: Self-pay

## 2016-06-20 ENCOUNTER — Ambulatory Visit (HOSPITAL_COMMUNITY)
Admission: RE | Admit: 2016-06-20 | Discharge: 2016-06-20 | Disposition: A | Discharge status: Home / Self Care | Payer: Medicaid Other | Source: Ambulatory Visit | Attending: Obstetrics and Gynecology | Admitting: Obstetrics and Gynecology

## 2016-06-20 VITALS — BP 113/65 | HR 105 | Wt 262.3 lb

## 2016-06-20 DIAGNOSIS — O24410 Gestational diabetes mellitus in pregnancy, diet controlled: Secondary | ICD-10-CM | POA: Diagnosis not present

## 2016-06-20 DIAGNOSIS — Z3A32 32 weeks gestation of pregnancy: Secondary | ICD-10-CM | POA: Diagnosis not present

## 2016-06-20 DIAGNOSIS — O98413 Viral hepatitis complicating pregnancy, third trimester: Secondary | ICD-10-CM

## 2016-06-20 DIAGNOSIS — O365930 Maternal care for other known or suspected poor fetal growth, third trimester, not applicable or unspecified: Secondary | ICD-10-CM | POA: Insufficient documentation

## 2016-06-20 DIAGNOSIS — F332 Major depressive disorder, recurrent severe without psychotic features: Secondary | ICD-10-CM | POA: Diagnosis not present

## 2016-06-20 DIAGNOSIS — B1710 Acute hepatitis C without hepatic coma: Secondary | ICD-10-CM | POA: Diagnosis not present

## 2016-06-20 DIAGNOSIS — O99343 Other mental disorders complicating pregnancy, third trimester: Secondary | ICD-10-CM | POA: Diagnosis not present

## 2016-06-20 DIAGNOSIS — O0993 Supervision of high risk pregnancy, unspecified, third trimester: Secondary | ICD-10-CM

## 2016-06-20 DIAGNOSIS — R8271 Bacteriuria: Secondary | ICD-10-CM

## 2016-06-20 MED ORDER — BUTALBITAL-APAP-CAFFEINE 50-325-40 MG PO CAPS: 1.0000 | ORAL_CAPSULE | ORAL | 0 refills | Status: DC

## 2016-06-20 NOTE — Progress Notes (Signed)
Prenatal Visit Note Date: 06/20/2016 [facility]: [facility]  Subjective:  Cheryl Holt is a 25 y.o. G2P1001 at 225w3d being seen today for ongoing prenatal care.  She is currently monitored for the following issues for this high-risk pregnancy and has Major depressive disorder, recurrent severe without psychotic features (HCC); Cannabis use disorder, moderate, dependence (HCC); Sedative, hypnotic or anxiolytic use disorder, mild, abuse; Tobacco use disorder; Opioid use disorder, severe, in sustained remission (HCC); Substance abuse affecting pregnancy in second trimester, antepartum; Anxiety and depression; History of gestational diabetes mellitus; Rh negative status during pregnancy; GBS bacteriuria; Gestational diabetes; Obesity in pregnancy; Hepatitis C; and Supervision of high-risk pregnancy on her problem list.  Patient reports no complaints.   Contractions: Irritability. Vag. Bleeding: None.  Movement: Present. Denies leaking of fluid.   The following portions of the patient's history were reviewed and updated as appropriate: allergies, current medications, past family history, past medical history, past social history, past surgical history and problem list. Problem list updated.  Objective:   Vitals:   06/20/16 1508  BP: 113/65  Pulse: (!) 105  Weight: 262 lb 4.8 oz (119 kg)    Fetal Status: Fetal Heart Rate (bpm): 132   Movement: Present  Presentation: Vertex  General:  Alert, oriented and cooperative. Patient is in no acute distress.  Skin: Skin is warm and dry. No rash noted.   Cardiovascular: Normal heart rate noted  Respiratory: Normal respiratory effort, no problems with respiration noted  Abdomen: Soft, gravid, appropriate for gestational age. Pain/Pressure: Present     Pelvic:  Cervical exam deferred        Extremities: Normal range of motion.  Edema: Trace  Mental Status: Normal mood and affect. Normal behavior. Normal judgment and thought content.    Urinalysis:      Assessment and Plan:  Pregnancy: G2P1001 at 5925w3d  1. Supervision of high risk pregnancy in third trimester Routine care. Gc/ct and d/w re: BC nv. Has occasional HAs and uses the fioricet every other day. Advised her to try and wean down to 2x/week and watch sweet tea consumption. +GBS bacteruria.  - Butalbital-APAP-Caffeine 50-325-40 MG capsule; Take 1 capsule by mouth every other day.  Dispense: 12 capsule; Refill: 0  2. GDMa1 Didn't bring log or meter but states values (AM fastings and PPs) are normal.   [facility]. [facility]   4. HepC Normal baseline LFTs  5. Mood No issues. Pt states that she feels well. Continue ssri  Preterm labor symptoms and general obstetric precautions including but not limited to vaginal bleeding, contractions, leaking of fluid and fetal movement were reviewed in detail with the patient. Please refer to After Visit Summary for other counseling recommendations.  Return in about 2 weeks (around 07/04/2016) for rob.   [facility] Bingharlie Luna Audia, MD

## 2016-06-28 ENCOUNTER — Other Ambulatory Visit: Payer: Self-pay | Admitting: General Practice

## 2016-06-28 DIAGNOSIS — O0993 Supervision of high risk pregnancy, unspecified, third trimester: Secondary | ICD-10-CM

## 2016-06-28 MED ORDER — PRENATAL VITAMINS 0.8 MG PO TABS
1.0000 | ORAL_TABLET | Freq: Every day | ORAL | 2 refills | Status: DC
Start: 2016-06-28 — End: 2017-07-03

## 2016-07-05 ENCOUNTER — Encounter: Payer: Medicaid Other | Admitting: Family

## 2016-07-05 ENCOUNTER — Inpatient Hospital Stay (HOSPITAL_COMMUNITY)
Admission: AD | Admit: 2016-07-05 | Discharge: 2016-07-05 | Disposition: A | Discharge status: Home / Self Care | Payer: Medicaid Other | Source: Ambulatory Visit | Attending: Obstetrics and Gynecology | Admitting: Obstetrics and Gynecology

## 2016-07-05 DIAGNOSIS — J3089 Other allergic rhinitis: Secondary | ICD-10-CM

## 2016-07-05 DIAGNOSIS — Z3A36 36 weeks gestation of pregnancy: Secondary | ICD-10-CM | POA: Insufficient documentation

## 2016-07-05 DIAGNOSIS — O99513 Diseases of the respiratory system complicating pregnancy, third trimester: Secondary | ICD-10-CM | POA: Diagnosis not present

## 2016-07-05 DIAGNOSIS — J309 Allergic rhinitis, unspecified: Secondary | ICD-10-CM | POA: Insufficient documentation

## 2016-07-05 DIAGNOSIS — O9989 Other specified diseases and conditions complicating pregnancy, childbirth and the puerperium: Secondary | ICD-10-CM

## 2016-07-05 DIAGNOSIS — Z3493 Encounter for supervision of normal pregnancy, unspecified, third trimester: Secondary | ICD-10-CM

## 2016-07-05 DIAGNOSIS — R0981 Nasal congestion: Secondary | ICD-10-CM | POA: Diagnosis present

## 2016-07-05 DIAGNOSIS — Z87891 Personal history of nicotine dependence: Secondary | ICD-10-CM | POA: Insufficient documentation

## 2016-07-05 DIAGNOSIS — O24410 Gestational diabetes mellitus in pregnancy, diet controlled: Secondary | ICD-10-CM

## 2016-07-05 MED ORDER — CETIRIZINE HCL 10 MG PO CAPS: 10.0000 mg | ORAL_CAPSULE | Freq: Every day | ORAL | 0 refills | Status: DC

## 2016-07-05 NOTE — MAU Provider Note (Signed)
History     CSN: [id number]  Arrival date and time: 07/05/16 1352   None     Chief Complaint  Patient presents with  . Nasal Congestion   G2P1001 @36 .4 weeks here with nasal congestion and post nasal drip for the last few days. She had sore throat this am but not now. Some sneezing as well. No fever. She reports non-productive cough. She has hx of seasonal allergies with similar sx. She denies sick contacts. Reports good FM. No pregnancy concerns. She has received Influenza vaccine. She needs medical clearance that she is not contagious for her residence (Room at the [facility]).    OB History    Gravida Para Term Preterm AB Living   2 1 1     1    SAB TAB Ectopic Multiple Live Births           1      Past Medical History:  Diagnosis Date  . Depression   . Gestational diabetes   . Headache   . Hepatitis C 05/11/2016   [ ]  f/u quant [ ]  cmp  . Infection    UTI    Past Surgical History:  Procedure Laterality Date  . CHOLECYSTECTOMY, LAPAROSCOPIC  2014    Family History  Problem Relation Age of Onset  . Cancer Mother     pancreatic  . Migraines Mother   . Mental illness Father   . Epilepsy Father   . Migraines Father   . Hypertension Father     Social History  Substance Use Topics  . Smoking status: Former Smoker    Packs/day: 0.50    Years: 10.00    Types: Cigarettes    Quit date: 03/20/2016  . Smokeless tobacco: Never Used  . Alcohol use No    Allergies: No Known Allergies  Prescriptions Prior to Admission  Medication Sig Dispense Refill Last Dose  . acetaminophen (TYLENOL) 325 MG tablet Take 650 mg by mouth every 6 (six) hours as needed for mild pain, moderate pain or headache.   Taking  . Butalbital-APAP-Caffeine 50-325-40 MG capsule Take 1 capsule by mouth every other day. 12 capsule 0   . FLUoxetine (PROZAC) 40 MG capsule Take 40 mg by mouth at bedtime.    Taking  . Prenatal Multivit-Min-Fe-FA (PRENATAL VITAMINS) 0.8 MG tablet Take 1 tablet by mouth  daily. 30 tablet 2   . promethazine (PHENERGAN) 25 MG tablet Take 1 tablet (25 mg total) by mouth every 6 (six) hours as needed for nausea or vomiting. (Patient not taking: Reported on 06/20/2016) 12 tablet 0 Not Taking    Review of Systems  Constitutional: Negative for chills, fever and malaise/fatigue.  HENT: Positive for congestion and sore throat. Negative for ear pain.   Respiratory: Positive for cough. Negative for sputum production, shortness of breath and wheezing.   Cardiovascular: Negative for chest pain and orthopnea.  Neurological: Negative for headaches.   Physical Exam   Blood pressure 137/71, pulse 93, temperature 98.3 F (36.8 C), temperature source Oral, resp. rate 18, weight 120.2 kg (265 lb), last menstrual period 10/23/2015, SpO2 98 %.  Physical Exam  Constitutional: She is oriented to person, place, and time. She appears well-developed and well-nourished. No distress.  HENT:  Head: Normocephalic and atraumatic.  Mouth/Throat: Oropharynx is clear and moist and mucous membranes are normal. No oropharyngeal exudate, posterior oropharyngeal edema, posterior oropharyngeal erythema or tonsillar abscesses.  Neck: Normal range of motion. Neck supple.  Cardiovascular: Normal rate and regular rhythm.  Respiratory: Effort normal and breath sounds normal. No accessory muscle usage. No tachypnea. No respiratory distress. She has no decreased breath sounds. She has no wheezes. She has no rales.  Musculoskeletal: Normal range of motion.  Neurological: She is alert and oriented to person, place, and time.  Skin: Skin is warm and dry.  Psychiatric: She has a normal mood and affect.   FHT: 138 bpm MAU Course  Procedures  MDM No evidence of pneumonia, strep, or influenza. Stable for discharge home.  Assessment and Plan  [redacted] weeks gestation Allergic rhinitis  Discharge home Zyrtec Cool mist humidifier Follow up in [facility] in 1 week-need to schedule appt (no show today) Return  to MAU for emergencies  Allergies as of 07/05/2016   No Known Allergies     Medication List    TAKE these medications   acetaminophen 325 MG tablet Commonly known as:  TYLENOL Take 650 mg by mouth every 6 (six) hours as needed for mild pain, moderate pain or headache.   Butalbital-APAP-Caffeine 50-325-40 MG capsule Take 1 capsule by mouth every other day.   Cetirizine HCl 10 MG Caps Take 1 capsule (10 mg total) by mouth daily.   FLUoxetine 40 MG capsule Commonly known as:  PROZAC Take 40 mg by mouth at bedtime.   Prenatal Vitamins 0.8 MG tablet Take 1 tablet by mouth daily.   promethazine 25 MG tablet Commonly known as:  PHENERGAN Take 1 tablet (25 mg total) by mouth every 6 (six) hours as needed for nausea or vomiting.      Donette LarryMelanie Vu Liebman 07/05/2016, 4:25 PM

## 2016-07-05 NOTE — Discharge Instructions (Signed)
Allergic Rhinitis Allergic rhinitis is when the mucous membranes in the nose respond to allergens. Allergens are particles in the air that cause your body to have an allergic reaction. This causes you to release allergic antibodies. Through a chain of events, these eventually cause you to release histamine into the blood stream. Although meant to protect the body, it is this release of histamine that causes your discomfort, such as frequent sneezing, congestion, and an itchy, runny nose. What are the causes? Seasonal allergic rhinitis (hay fever) is caused by pollen allergens that may come from grasses, trees, and weeds. Year-round allergic rhinitis (perennial allergic rhinitis) is caused by allergens such as house dust mites, pet dander, and mold spores. What are the signs or symptoms?  Nasal stuffiness (congestion).  Itchy, runny nose with sneezing and tearing of the eyes. How is this diagnosed? Your health care provider can help you determine the allergen or allergens that trigger your symptoms. If you and your health care provider are unable to determine the allergen, skin or blood testing may be used. Your health care provider will diagnose your condition after taking your health history and performing a physical exam. Your health care provider may assess you for other related conditions, such as asthma, pink eye, or an ear infection. How is this treated? Allergic rhinitis does not have a cure, but it can be controlled by:  Medicines that block allergy symptoms. These may include allergy shots, nasal sprays, and oral antihistamines.  Avoiding the allergen. Hay fever may often be treated with antihistamines in pill or nasal spray forms. Antihistamines block the effects of histamine. There are over-the-counter medicines that may help with nasal congestion and swelling around the eyes. Check with your health care provider before taking or giving this medicine. If avoiding the allergen or the  medicine prescribed do not work, there are many new medicines your health care provider can prescribe. Stronger medicine may be used if initial measures are ineffective. Desensitizing injections can be used if medicine and avoidance does not work. Desensitization is when a patient is given ongoing shots until the body becomes less sensitive to the allergen. Make sure you follow up with your health care provider if problems continue. Follow these instructions at home: It is not possible to completely avoid allergens, but you can reduce your symptoms by taking steps to limit your exposure to them. It helps to know exactly what you are allergic to so that you can avoid your specific triggers. Contact a health care provider if:  You have a fever.  You develop a cough that does not stop easily (persistent).  You have shortness of breath.  You start wheezing.  Symptoms interfere with normal daily activities. This information is not intended to replace advice given to you by your health care provider. Make sure you discuss any questions you have with your health care provider. Document Released: 04/03/2001 Document Revised: 03/09/2016 Document Reviewed: 03/16/2013 Elsevier Interactive Patient Education  2017 Elsevier Inc.  

## 2016-07-05 NOTE — MAU Note (Signed)
Urine in lab 

## 2016-07-05 NOTE — MAU Note (Signed)
Pt reports she started having nasal congestion and a slight cough. That started last night. No fever. [facility].

## 2016-07-09 ENCOUNTER — Inpatient Hospital Stay (HOSPITAL_COMMUNITY)
Admission: AD | Admit: 2016-07-09 | Discharge: 2016-07-09 | Disposition: A | Discharge status: Home / Self Care | Payer: Medicaid Other | Source: Ambulatory Visit | Attending: Family Medicine | Admitting: Family Medicine

## 2016-07-09 ENCOUNTER — Encounter (HOSPITAL_COMMUNITY): Payer: Self-pay

## 2016-07-09 DIAGNOSIS — O99513 Diseases of the respiratory system complicating pregnancy, third trimester: Secondary | ICD-10-CM | POA: Insufficient documentation

## 2016-07-09 DIAGNOSIS — B9789 Other viral agents as the cause of diseases classified elsewhere: Secondary | ICD-10-CM

## 2016-07-09 DIAGNOSIS — F17210 Nicotine dependence, cigarettes, uncomplicated: Secondary | ICD-10-CM | POA: Insufficient documentation

## 2016-07-09 DIAGNOSIS — J069 Acute upper respiratory infection, unspecified: Secondary | ICD-10-CM | POA: Diagnosis not present

## 2016-07-09 DIAGNOSIS — Z3A37 37 weeks gestation of pregnancy: Secondary | ICD-10-CM | POA: Diagnosis not present

## 2016-07-09 DIAGNOSIS — R05 Cough: Secondary | ICD-10-CM | POA: Diagnosis present

## 2016-07-09 DIAGNOSIS — O99333 Smoking (tobacco) complicating pregnancy, third trimester: Secondary | ICD-10-CM | POA: Insufficient documentation

## 2016-07-09 DIAGNOSIS — O24420 Gestational diabetes mellitus in childbirth, diet controlled: Secondary | ICD-10-CM | POA: Insufficient documentation

## 2016-07-09 DIAGNOSIS — O24410 Gestational diabetes mellitus in pregnancy, diet controlled: Secondary | ICD-10-CM

## 2016-07-09 LAB — URINALYSIS, ROUTINE W REFLEX MICROSCOPIC
Bilirubin Urine: NEGATIVE
Glucose, UA: >=500 mg/dL — AB
Hgb urine dipstick: NEGATIVE
Ketones, ur: NEGATIVE mg/dL
Nitrite: NEGATIVE
PH: 7 (ref 5.0–8.0)
Protein, ur: 30 mg/dL — AB
SPECIFIC GRAVITY, URINE: 1.014 (ref 1.005–1.030)

## 2016-07-09 MED ORDER — FLUTICASONE PROPIONATE 50 MCG/ACT NA SUSP: 2.0000 | Freq: Every day | NASAL | 2 refills | Status: DC

## 2016-07-09 NOTE — Discharge Instructions (Signed)
Safe Medications in Pregnancy   Colds/Coughs/Allergies:  Benadryl (alcohol free) 25 mg every 6 hours as needed  Breath right strips  Claritin  Cepacol throat lozenges  Chloraseptic throat spray  Cold-Eeze- up to three times per day  Cough drops, alcohol free  Flonase (by prescription only)  Guaifenesin  Mucinex  Robitussin DM (plain only, alcohol free)  Saline nasal spray/drops  Sudafed (pseudoephedrine) & Actifed * use only after [redacted] weeks gestation and if you do not have high blood pressure  Tylenol  Vicks Vaporub  Zinc lozenges  Zyrtec   **If taking multiple medications, please check labels to avoid duplicating the same active ingredients  **take medication as directed on the label  ** Do not exceed 4000 mg of tylenol in 24 hours  **Do not take medications that contain aspirin or ibuprofen         Cough, Adult Introduction A cough helps to clear your throat and lungs. A cough may last only 2-3 weeks (acute), or it may last longer than 8 weeks (chronic). Many different things can cause a cough. A cough may be a sign of an illness or another medical condition. Follow these instructions at home:  Pay attention to any changes in your cough.  Take medicines only as told by your doctor.  If you were prescribed an antibiotic medicine, take it as told by your doctor. Do not stop taking it even if you start to feel better.  Talk with your doctor before you try using a cough medicine.  Drink enough fluid to keep your pee (urine) clear or pale yellow.  If the air is dry, use a cold steam vaporizer or humidifier in your home.  Stay away from things that make you cough at work or at home.  If your cough is worse at night, try using extra pillows to raise your head up higher while you sleep.  Do not smoke, and try not to be around smoke. If you need help quitting, ask your doctor.  Do not have caffeine.  Do not drink alcohol.  Rest as needed. Contact a doctor if:  You  have new problems (symptoms).  You cough up yellow fluid (pus).  Your cough does not get better after 2-3 weeks, or your cough gets worse.  Medicine does not help your cough and you are not sleeping well.  You have pain that gets worse or pain that is not helped with medicine.  You have a fever.  You are losing weight and you do not know why.  You have night sweats. Get help right away if:  You cough up blood.  You have trouble breathing.  Your heartbeat is very fast. This information is not intended to replace advice given to you by your health care provider. Make sure you discuss any questions you have with your health care provider. Document Released: 03/22/2011 Document Revised: 12/15/2015 Document Reviewed: 09/15/2014  2017 Elsevier

## 2016-07-09 NOTE — MAU Provider Note (Signed)
MAU PROVIDER NOTE  Chief Complaint:  Cough   HPI: Cheryl Holt is a 25 y.o. G2P1001 at [redacted]w[redacted]d who presents to maternity admissions reporting a cough that began a few days ago.  Cough is now dry but is usually yellow/green.  No blood in sputum.  Some shortness of breath when she lays down so she tries to prop up in bed with pillows. Did not take anything at home for the cough.  Was seen in MAU a couple of days ago for nasal drip.  Was prescribed Zyrtec.  Denies fevers, chills, abdominal pain.  Has had some nausea and vomiting but thinks that is related to her pregnancy.  Has had some congestion lately.  Has not had shortness of breath before in pregnancy.    CTX Q41min, leakage of fluid or vaginal bleeding. Good fetal movement.   Pregnancy Course: gDM, Hepatitis C  Past Medical History: Past Medical History:  Diagnosis Date  . Depression   . Gestational diabetes   . Headache   . Hepatitis C 05/11/2016   [ ]  f/u quant [ ]  cmp  . Infection    UTI   Past obstetric history: OB History  Gravida Para Term Preterm AB Living  2 1 1     1   SAB TAB Ectopic Multiple Live Births          1    # Outcome Date GA Lbr Len/2nd Weight Sex Delivery Anes PTL Lv  2 Current           1 Term 09/23/11    M Vag-Spont EPI N LIV     Past Surgical History: Past Surgical History:  Procedure Laterality Date  . CHOLECYSTECTOMY, LAPAROSCOPIC  2014   Family History: Family History  Problem Relation Age of Onset  . Cancer Mother     pancreatic  . Migraines Mother   . Mental illness Father   . Epilepsy Father   . Migraines Father   . Hypertension Father    Social History: Social History  Substance Use Topics  . Smoking status: Current Some Day Smoker    Packs/day: 0.50    Years: 10.00    Types: Cigarettes    Last attempt to quit: 03/20/2016  . Smokeless tobacco: Never Used  . Alcohol use No   Allergies: No Known Allergies  Meds:  Prescriptions Prior to Admission  Medication Sig Dispense  Refill Last Dose  . acetaminophen (TYLENOL) 325 MG tablet Take 650 mg by mouth every 6 (six) hours as needed for mild pain, moderate pain or headache.   Taking  . Butalbital-APAP-Caffeine 50-325-40 MG capsule Take 1 capsule by mouth every other day. 12 capsule 0   . Cetirizine HCl 10 MG CAPS Take 1 capsule (10 mg total) by mouth daily. 30 capsule 0   . FLUoxetine (PROZAC) 40 MG capsule Take 40 mg by mouth at bedtime.    Taking  . Prenatal Multivit-Min-Fe-FA (PRENATAL VITAMINS) 0.8 MG tablet Take 1 tablet by mouth daily. 30 tablet 2   . promethazine (PHENERGAN) 25 MG tablet Take 1 tablet (25 mg total) by mouth every 6 (six) hours as needed for nausea or vomiting. (Patient not taking: Reported on 06/20/2016) 12 tablet 0 Not Taking   I have reviewed patient's Past Medical Hx, Surgical Hx, Family Hx, Social Hx, medications and allergies.   ROS:  A comprehensive ROS was negative except per HPI.   Physical Exam   Patient Vitals for the past 24 hrs:  BP Temp Pulse  Resp SpO2 Height Weight  07/09/16 0952 - - 106 - 96 % - -  07/09/16 0947 - - 104 - 96 % - -  07/09/16 0942 - - 104 - 96 % - -  07/09/16 0937 124/64 - 114 - 96 % - -  07/09/16 0936 - 98.3 F (36.8 C) - 20 - - -  07/09/16 [id number] - - - - - 5\' 5"  (1.651 m) 264 lb 9.6 oz (120 kg)   Constitutional: Well-developed, well-nourished female in no acute distress.  Cardiovascular: normal rate Respiratory: normal effort GI: Abd soft, non-tender, gravid appropriate for gestational age. Pos BS x 4 MS: Extremities nontender, no edema, normal ROM Neurologic: Alert and oriented x 4.  GU: Neg CVAT.  Pelvic: NEFG, physiologic discharge, no blood, cervix clean. No CMT    FHT:  Baseline 140s , moderate variability, accelerations present, no decelerations Contractions: q 4 mins   Labs: Results for orders placed or performed during the hospital encounter of 07/09/16 (from the past 24 hour(s))  Urinalysis, Routine w reflex microscopic     Status:  Abnormal   Collection Time: 07/09/16  9:19 AM  Result Value Ref Range   Color, Urine YELLOW YELLOW   APPearance CLOUDY (A) CLEAR   Specific Gravity, Urine 1.014 1.005 - 1.030   pH 7.0 5.0 - 8.0   Glucose, UA >=500 (A) NEGATIVE mg/dL   Hgb urine dipstick NEGATIVE NEGATIVE   Bilirubin Urine NEGATIVE NEGATIVE   Ketones, ur NEGATIVE NEGATIVE mg/dL   Protein, ur 30 (A) NEGATIVE mg/dL   Nitrite NEGATIVE NEGATIVE   Leukocytes, UA MODERATE (A) NEGATIVE   RBC / HPF 0-5 0 - 5 RBC/hpf   WBC, UA 6-30 0 - 5 WBC/hpf   Bacteria, UA MANY (A) NONE SEEN   Squamous Epithelial / LPF 6-30 (A) NONE SEEN   Mucous PRESENT     Imaging:  [facility] Fetal Bpp Wo Non Stress  Result Date: 06/20/2016 OBSTETRICAL ULTRASOUND: This exam was performed within a [facility]. The OB US report was generated in the AS system, and faxed to the ordering physician.  This report is available in the [facility] [employer] PACS. See the AS Obstetric US report via the Image Link.  [facility] Ob Follow Up  Result Date: 06/20/2016 OBSTETRICAL ULTRASOUND: This exam was performed within a [facility]. The OB US report was generated in the AS system, and faxed to the ordering physician.  This report is available in the [facility] [employer] PACS. See the AS Obstetric US report via the Image Link.  [facility] Ua Doppler Re-eval  Result Date: 06/20/2016 OBSTETRICAL ULTRASOUND: This exam was performed within a  Ultrasound Department. The OB US report was generated in the AS system, and faxed to the ordering physician.  This report is available in the [facility] [employer] PACS. See the AS Obstetric US report via the Image Link.  MAU Course: Pulse ox 98 FHR Cat I  MDM: Plan of care reviewed with patient, including labs and tests ordered and medical treatment.  Assessment: 1. Viral URI with cough   2. Diet controlled gestational diabetes mellitus (GDM) in third trimester    Plan: IUP at 3729w1d with cough.  Likely viral URI .   FHR Cat I with CTX Q4 min.  No labor issues.  Lungs clear with good o2 sat.  Advised Mucinex and Flonase nasal spray prescription given.   Discharge home in stable condition.  Recommended to drink fluids and stay hydrated.  Strict  return precautions. Labor precautions and fetal kick counts.    Allergies as of 07/09/2016   No Known Allergies     Medication List    STOP taking these medications   promethazine 25 MG tablet Commonly known as:  PHENERGAN     TAKE these medications   acetaminophen 325 MG tablet Commonly known as:  TYLENOL Take 650 mg by mouth every 6 (six) hours as needed for mild pain, moderate pain or headache.   Butalbital-APAP-Caffeine 50-325-40 MG capsule Take 1 capsule by mouth every other day.   Cetirizine HCl 10 MG Caps Take 1 capsule (10 mg total) by mouth daily.   FLUoxetine 40 MG capsule Commonly known as:  PROZAC Take 40 mg by mouth at bedtime.   fluticasone 50 MCG/ACT nasal spray Commonly known as:  FLONASE Place 2 sprays into both nostrils daily.   Prenatal Vitamins 0.8 MG tablet Take 1 tablet by mouth daily.       Freddrick MarchYashika Amin, MD PGY-1 07/09/2016 10:48 AM  OB FELLOW DISCHARGE ATTESTATION  I have seen and examined this patient and agree with above documentation in the resident's note.   Ernestina PennaNicholas Schenk, MD 12:09 PM

## 2016-07-12 ENCOUNTER — Encounter: Payer: Medicaid Other | Admitting: Obstetrics & Gynecology

## 2016-07-26 ENCOUNTER — Encounter: Payer: Medicaid Other | Admitting: Obstetrics and Gynecology

## 2016-08-06 ENCOUNTER — Emergency Department (HOSPITAL_COMMUNITY)
Admission: EM | Admit: 2016-08-06 | Discharge: 2016-08-06 | Disposition: A | Discharge status: Home / Self Care | Payer: Medicaid Other | Attending: Emergency Medicine | Admitting: Emergency Medicine

## 2016-08-06 ENCOUNTER — Encounter (HOSPITAL_COMMUNITY): Payer: Self-pay | Admitting: *Deleted

## 2016-08-06 ENCOUNTER — Encounter (HOSPITAL_COMMUNITY): Payer: Self-pay

## 2016-08-06 ENCOUNTER — Emergency Department (HOSPITAL_COMMUNITY): Payer: Medicaid Other

## 2016-08-06 DIAGNOSIS — K5900 Constipation, unspecified: Secondary | ICD-10-CM | POA: Insufficient documentation

## 2016-08-06 DIAGNOSIS — F17210 Nicotine dependence, cigarettes, uncomplicated: Secondary | ICD-10-CM | POA: Insufficient documentation

## 2016-08-06 DIAGNOSIS — N10 Acute tubulo-interstitial nephritis: Secondary | ICD-10-CM

## 2016-08-06 DIAGNOSIS — N12 Tubulo-interstitial nephritis, not specified as acute or chronic: Secondary | ICD-10-CM | POA: Diagnosis not present

## 2016-08-06 DIAGNOSIS — R1031 Right lower quadrant pain: Secondary | ICD-10-CM | POA: Diagnosis present

## 2016-08-06 LAB — URINALYSIS, ROUTINE W REFLEX MICROSCOPIC
Bacteria, UA: NONE SEEN
Bilirubin Urine: NEGATIVE
GLUCOSE, UA: NEGATIVE mg/dL
KETONES UR: NEGATIVE mg/dL
Nitrite: NEGATIVE
PH: 6 (ref 5.0–8.0)
Protein, ur: 30 mg/dL — AB
SPECIFIC GRAVITY, URINE: 1.023 (ref 1.005–1.030)

## 2016-08-06 LAB — COMPREHENSIVE METABOLIC PANEL
ALK PHOS: 87 U/L (ref 38–126)
ALT: 44 U/L (ref 14–54)
AST: 27 U/L (ref 15–41)
Albumin: 3 g/dL — ABNORMAL LOW (ref 3.5–5.0)
Anion gap: 11 (ref 5–15)
BUN: 9 mg/dL (ref 6–20)
CALCIUM: 8.9 mg/dL (ref 8.9–10.3)
CO2: 20 mmol/L — AB (ref 22–32)
CREATININE: 0.65 mg/dL (ref 0.44–1.00)
Chloride: 108 mmol/L (ref 101–111)
Glucose, Bld: 95 mg/dL (ref 65–99)
Potassium: 3.7 mmol/L (ref 3.5–5.1)
Sodium: 139 mmol/L (ref 135–145)
Total Bilirubin: 0.4 mg/dL (ref 0.3–1.2)
Total Protein: 5.7 g/dL — ABNORMAL LOW (ref 6.5–8.1)

## 2016-08-06 LAB — CBC
HCT: 34.4 % — ABNORMAL LOW (ref 36.0–46.0)
Hemoglobin: 11.8 g/dL — ABNORMAL LOW (ref 12.0–15.0)
MCH: 30.7 pg (ref 26.0–34.0)
MCHC: 34.3 g/dL (ref 30.0–36.0)
MCV: 89.6 fL (ref 78.0–100.0)
PLATELETS: 291 10*3/uL (ref 150–400)
RBC: 3.84 MIL/uL — AB (ref 3.87–5.11)
RDW: 13 % (ref 11.5–15.5)
WBC: 11.2 10*3/uL — ABNORMAL HIGH (ref 4.0–10.5)

## 2016-08-06 LAB — LIPASE, BLOOD: Lipase: 17 U/L (ref 11–51)

## 2016-08-06 MED ORDER — ONDANSETRON HCL 4 MG/2ML IJ SOLN
4.0000 mg | Freq: Once | INTRAMUSCULAR | Status: AC
  Administered 2016-08-06: 4 mg via INTRAVENOUS
  Filled 2016-08-06: qty 2

## 2016-08-06 MED ORDER — HYDROMORPHONE HCL 2 MG/ML IJ SOLN
1.0000 mg | Freq: Once | INTRAMUSCULAR | Status: AC
  Administered 2016-08-06: 1 mg via INTRAVENOUS
  Filled 2016-08-06: qty 1

## 2016-08-06 MED ORDER — CEPHALEXIN 500 MG PO CAPS: 500.0000 mg | ORAL_CAPSULE | Freq: Two times a day (BID) | ORAL | 0 refills | Status: DC

## 2016-08-06 MED ORDER — DEXTROSE 5 % IV SOLN
1.0000 g | Freq: Once | INTRAVENOUS | Status: AC
  Administered 2016-08-06: 1 g via INTRAVENOUS
  Filled 2016-08-06: qty 10

## 2016-08-06 MED ORDER — IOPAMIDOL (ISOVUE-300) INJECTION 61%
INTRAVENOUS | Status: AC
  Administered 2016-08-06: 100 mL
  Filled 2016-08-06: qty 100

## 2016-08-06 MED ORDER — MORPHINE SULFATE (PF) 4 MG/ML IV SOLN
4.0000 mg | Freq: Once | INTRAVENOUS | Status: AC
  Administered 2016-08-06: 4 mg via INTRAVENOUS
  Filled 2016-08-06: qty 1

## 2016-08-06 MED ORDER — OXYCODONE-ACETAMINOPHEN 5-325 MG PO TABS
1.0000 | ORAL_TABLET | Freq: Once | ORAL | Status: AC
  Administered 2016-08-06: 1 via ORAL
  Filled 2016-08-06: qty 1

## 2016-08-06 NOTE — ED Provider Notes (Signed)
Patient's CT shows concern for right-sided pyelonephritis. Her appendix is within normal limits. She also has a large stool burden in the right colon. She has no evidence of endometrial irregularity or ovarian pathology at this time. Patient does have too numerous to count white blood cells in her urine so pyelonephritis is most likely. Patient has received Rocephin here we'll send her home with Keflex. Spoke with patient and relative at length about follow-up in 2 days with OB/GYN or returning to [facility] immediately if symptoms worsen.   Cheryl SproutWhitney Omere Marti, MD 08/06/16 1011

## 2016-08-06 NOTE — ED Provider Notes (Signed)
[facility] Provider Note   CSN: [id number] Arrival date & time: 08/06/16  0115  By signing my name below, I, Vista Minkobert Ross, attest that this documentation has been prepared under the direction and in the presence of Gwyneth SproutWhitney Plunkett, MD. Electronically signed, Vista Minkobert Ross, ED Scribe. 08/06/16. 3:56 AM.  History   Chief Complaint Chief Complaint  Patient presents with  . Abdominal Pain    HPI HPI Comments: Cheryl Holt is a 26 y.o. female, G2P2,  who presents to the Emergency Department complaining of worsening, cramping RLQ abdominal pain that radiates medially to central abdomen, onset approximately three hours ago. Pt also reports associated nausea and diarrhea with her symptoms. She also notes chills earlier today but did not take her temperature. These are new symptoms since giving birth six days ago. [facility]. She is still currently having post-partum bleeding and reports that her bleeding stopped for two days and then began again, no heavy bleeding. Her abdominal pain started as a 4/10 and has worsened to an 8/10 currently. Localizes to right lower quadrant. No urinary symptoms. No vomiting.  The history is provided by the patient. No language interpreter was used.    Past Medical History:  Diagnosis Date  . Depression   . Gestational diabetes   . Headache   . Hepatitis C 05/11/2016   [ ]  f/u quant [ ]  cmp  . Infection    UTI    Patient Active Problem List   Diagnosis Date Noted  . Supervision of high-risk pregnancy 05/24/2016  . Hepatitis C 05/11/2016  . Obesity in pregnancy 05/10/2016  . Gestational diabetes 04/24/2016  . Rh negative status during pregnancy 04/04/2016  . GBS bacteriuria 04/04/2016  . History of gestational diabetes mellitus 03/27/2016  . Substance abuse affecting pregnancy in second trimester, antepartum 02/26/2016  . Anxiety and depression 02/26/2016  . Cannabis use disorder, moderate, dependence (HCC)  01/18/2016  . 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 Tobacco use disorder 01/18/2016  . Opioid use disorder, severe, in sustained remission (HCC) 01/18/2016  . 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 CHOLECYSTECTOMY, LAPAROSCOPIC  2014    OB History    Gravida Para Term Preterm AB Living   2 1 1     1    SAB TAB Ectopic Multiple Live Births           1       Home Medications    Prior to Admission medications   Medication Sig Start Date End Date Taking? Authorizing Provider  FLUoxetine (PROZAC) 40 MG capsule Take 40 mg by mouth at bedtime.    Yes Historical Provider, MD  Butalbital-APAP-Caffeine 50-325-40 MG capsule Take 1 capsule by mouth every other day. Patient not taking: Reported on 08/06/2016 06/20/16   [place] Bingharlie Pickens, MD  Cetirizine HCl 10 MG CAPS Take 1 capsule (10 mg total) by mouth daily. Patient not taking: Reported on 08/06/2016 07/05/16   Donette LarryMelanie Bhambri, CNM  fluticasone (FLONASE) 50 MCG/ACT nasal spray Place 2 sprays into both nostrils daily. Patient not taking: Reported on 08/06/2016 07/09/16   Freddrick MarchYashika Amin, MD  Prenatal Multivit-Min-Fe-FA (PRENATAL VITAMINS) 0.8 MG tablet Take 1 tablet by mouth daily. Patient not taking: Reported on 08/06/2016 06/28/16   [place] Bingharlie Pickens, MD   Family History Family History  Problem Relation Age of Onset  . Cancer Mother  pancreatic  . Migraines Mother   . Mental illness Father   . Epilepsy Father   . Migraines Father   . Hypertension Father    Social History Social History  Substance Use Topics  . Smoking status: Current Some Day Smoker    Packs/day: 0.50    Years: 10.00    Types: Cigarettes    Last attempt to quit: 03/20/2016  . Smokeless tobacco: Never Used  . Alcohol use No     Allergies   Patient has no known allergies.   Review of Systems Review of Systems    Constitutional: Positive for chills. Negative for fever.  Respiratory: Negative for shortness of breath.   Cardiovascular: Negative for chest pain.  Gastrointestinal: Positive for abdominal pain (RLQ), diarrhea and nausea. Negative for vomiting.  Genitourinary: Negative for dysuria and hematuria.  All other systems reviewed and are negative.   Physical Exam Updated Vital Signs BP 113/80 (BP Location: Left Arm)   Pulse 90   Temp 97.9 F (36.6 C) (Oral)   Resp (!) 96   Ht 5\' 5"  (1.651 m)   LMP 10/23/2015   SpO2 95%   Physical Exam  Constitutional: She is oriented to person, place, and time. She appears well-developed and well-nourished.  Overweight, no acute distress  HENT:  Head: Normocephalic and atraumatic.  Cardiovascular: Normal rate, regular rhythm and normal heart sounds.   Pulmonary/Chest: Effort normal and breath sounds normal. No respiratory distress. She has no wheezes.  Abdominal: Soft. Bowel sounds are normal. She exhibits no mass. There is tenderness. There is no guarding.  Right lower quadrant tenderness to palpation, no rebound or guarding  Genitourinary:  Genitourinary Comments: Deferred  Neurological: She is alert and oriented to person, place, and time.  Skin: Skin is warm and dry.  Psychiatric: She has a normal mood and affect.  Nursing note and vitals reviewed.    ED Treatments / Results  DIAGNOSTIC STUDIES: Oxygen Saturation is 100% on RA, normal by my interpretation.  COORDINATION OF CARE: 3:54 AM-Discussed treatment plan with pt at bedside and pt agreed to plan.   Labs (all labs ordered are listed, but only abnormal results are displayed) Labs Reviewed  COMPREHENSIVE METABOLIC PANEL - Abnormal; Notable for the following:       Result Value   CO2 20 (*)    Total Protein 5.7 (*)    Albumin 3.0 (*)    All other components within normal limits  CBC - Abnormal; Notable for the following:    WBC 11.2 (*)    RBC 3.84 (*)    Hemoglobin 11.8 (*)     HCT 34.4 (*)    All other components within normal limits  URINALYSIS, ROUTINE W REFLEX MICROSCOPIC - Abnormal; Notable for the following:    APPearance CLOUDY (*)    Hgb urine dipstick LARGE (*)    Protein, ur 30 (*)    Leukocytes, UA LARGE (*)    Squamous Epithelial / LPF 0-5 (*)    All other components within normal limits  URINE CULTURE  LIPASE, BLOOD    EKG  EKG Interpretation None       Radiology No results found.  Procedures Procedures (including critical care time)  Medications Ordered in ED Medications  iopamidol (ISOVUE-300) 61 % injection (not administered)  cefTRIAXone (ROCEPHIN) 1 g in dextrose 5 % 50 mL IVPB (1 g Intravenous New Bag/Given 08/06/16 0830)  morphine 4 MG/ML injection 4 mg (4 mg Intravenous Given 08/06/16 0418)  ondansetron (ZOFRAN)  injection 4 mg (4 mg Intravenous Given 08/06/16 0418)  HYDROmorphone (DILAUDID) injection 1 mg (1 mg Intravenous Given 08/06/16 0650)     Initial Impression / Assessment and Plan / ED Course  I have reviewed the triage vital signs and the nursing notes.  Pertinent labs & imaging results that were available during my care of the patient were reviewed by me and considered in my medical decision making (see chart for details).  Clinical Course as of Aug 06 834  Mon Aug 06, 2016  8657 Patient reports persistent pain. Reduce pain medication. CT ordered in consultation with Dr. Despina Hidden.    [CH]    Clinical Course User Index [CH] Shon Baton, MD    Patient presents with right lower quadrant crampy abdominal pain. Denies fever. She is postpartum 6 days. Reports vaginal delivery only.  Nontoxic but tender in the right lower quadrant. Discussed the patient with Dr. Despina Hidden. She has a mild leukocytosis. Large leuks but no dysuria. Will culture urine.  Given recent delivery, infection would certainly be on the differential. Torsion is also on the differential given the acuity of the symptoms. Will obtain CT for further  evaluation.    If CT negative, would treat for endometritis/UTI.  Final Clinical Impressions(s) / ED Diagnoses   Final diagnoses:  None    New Prescriptions New Prescriptions   No medications on file   I personally performed the services described in this documentation, which was scribed in my presence. The recorded information has been reviewed and is accurate.    Shon Baton, MD 08/06/16 [phone]

## 2016-08-06 NOTE — ED Notes (Signed)
Ambulated the patient to the bathroom down the hall.  She endorsed slight dizziness from the pain medicine, but tolerated the walk well with minimal assistance.

## 2016-08-06 NOTE — ED Triage Notes (Signed)
Pt states that she had a baby 6 days ago and about three hours ago she started having RLQ abd pain along with nausea. Denies diarrhea, last BM today.

## 2016-08-06 NOTE — ED Notes (Signed)
Pt verbalizes understanding of discharge instructions. Ambulatory at departure. Transported home by brother.

## 2016-08-09 ENCOUNTER — Encounter (HOSPITAL_COMMUNITY): Payer: Self-pay

## 2016-08-09 ENCOUNTER — Emergency Department (HOSPITAL_COMMUNITY)
Admission: EM | Admit: 2016-08-09 | Discharge: 2016-08-09 | Disposition: A | Discharge status: Home / Self Care | Payer: Medicaid Other | Attending: Emergency Medicine | Admitting: Emergency Medicine

## 2016-08-09 DIAGNOSIS — N12 Tubulo-interstitial nephritis, not specified as acute or chronic: Secondary | ICD-10-CM

## 2016-08-09 DIAGNOSIS — F17210 Nicotine dependence, cigarettes, uncomplicated: Secondary | ICD-10-CM | POA: Diagnosis not present

## 2016-08-09 DIAGNOSIS — R1030 Lower abdominal pain, unspecified: Secondary | ICD-10-CM | POA: Diagnosis present

## 2016-08-09 LAB — URINALYSIS, ROUTINE W REFLEX MICROSCOPIC
BILIRUBIN URINE: NEGATIVE
GLUCOSE, UA: NEGATIVE mg/dL
Ketones, ur: NEGATIVE mg/dL
Nitrite: NEGATIVE
Protein, ur: NEGATIVE mg/dL
SPECIFIC GRAVITY, URINE: 1.015 (ref 1.005–1.030)
pH: 6 (ref 5.0–8.0)

## 2016-08-09 LAB — URINALYSIS, MICROSCOPIC (REFLEX)

## 2016-08-09 MED ORDER — OXYCODONE-ACETAMINOPHEN 5-325 MG PO TABS
1.0000 | ORAL_TABLET | Freq: Once | ORAL | Status: AC
Start: 2016-08-09 — End: 2016-08-09
  Administered 2016-08-09: 1 via ORAL
  Filled 2016-08-09: qty 1

## 2016-08-09 MED ORDER — OXYCODONE-ACETAMINOPHEN 5-325 MG PO TABS: 1.0000 | ORAL_TABLET | Freq: Three times a day (TID) | ORAL | 0 refills | Status: DC | PRN

## 2016-08-09 NOTE — ED Triage Notes (Signed)
Pt arrived via [facility] from home c/o lower abdominal and low back pain.  Pt 10 days postpartum.

## 2016-08-09 NOTE — Discharge Instructions (Signed)
If you use the percocet for pain, you should pump and dump at least once before breastfeeding or pumping milk for your infant.

## 2016-08-09 NOTE — ED Notes (Signed)
The pt is alert no distress 

## 2016-08-09 NOTE — ED Provider Notes (Signed)
[facility] Provider Note   CSN: [id number] Arrival date & time: 08/09/16  0101   By signing my name below, I, Cheryl Holt, attest that this documentation has been prepared under the direction and in the presence of Cheryl MemosJason Fallan Mccarey, MD . Electronically Signed: Freida Busmaniana Holt, Scribe. 08/09/2016. 1:15 AM.   History   Chief Complaint Chief Complaint  Patient presents with  . Abdominal Pain      The history is provided by the patient. No language interpreter was used.    HPI Comments:  Cheryl ScotBethany Holt is a 26 y.o. female 10 days postpartum, who presents to the Emergency Department complaining of worsening, back pain since yesterday. She reports associated chills and continued lower abdominal pain. Pt was seen in the ED on 08/06/16 for same and diagnosed with pyelonephritis and started on Keflex which she has been complaint with. She has also been taking tylenol with little relief.   Past Medical History:  Diagnosis Date  . Depression   . Gestational diabetes   . Headache   . Hepatitis C 05/11/2016   [ ]  f/u quant [ ]  cmp  . Infection    UTI    Patient Active Problem List   Diagnosis Date Noted  . Supervision of high-risk pregnancy 05/24/2016  . Hepatitis C 05/11/2016  . Obesity in pregnancy 05/10/2016  . Gestational diabetes 04/24/2016  . Rh negative status during pregnancy 04/04/2016  . GBS bacteriuria 04/04/2016  . History of gestational diabetes mellitus 03/27/2016  . Substance abuse affecting pregnancy in second trimester, antepartum 02/26/2016  . Anxiety and depression 02/26/2016  . Cannabis use disorder, moderate, dependence (HCC) 01/18/2016  . 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 Tobacco use disorder 01/18/2016  . Opioid use disorder, severe, in sustained remission (HCC) 01/18/2016  . Major depressive disorder, recurrent severe without psychotic features (HCC) 01/17/2016    Past Surgical History:  Procedure Laterality  Date  . CHOLECYSTECTOMY, LAPAROSCOPIC  2014    OB History    Gravida Para Term Preterm AB Living   2 1 1     1    SAB TAB Ectopic Multiple Live Births           1       Home Medications    Prior to Admission medications   Medication Sig Start Date End Date Taking? Authorizing Provider  Butalbital-APAP-Caffeine 50-325-40 MG capsule Take 1 capsule by mouth every other day. Patient not taking: Reported on 08/06/2016 06/20/16   [place] Bingharlie Pickens, MD  cephALEXin (KEFLEX) 500 MG capsule Take 1 capsule (500 mg total) by mouth 2 (two) times daily. 08/06/16   Gwyneth SproutWhitney Plunkett, MD  Cetirizine HCl 10 MG CAPS Take 1 capsule (10 mg total) by mouth daily. Patient not taking: Reported on 08/06/2016 07/05/16   Donette LarryMelanie Bhambri, CNM  FLUoxetine (PROZAC) 40 MG capsule Take 40 mg by mouth at bedtime.     Historical Provider, MD  fluticasone (FLONASE) 50 MCG/ACT nasal spray Place 2 sprays into both nostrils daily. Patient not taking: Reported on 08/06/2016 07/09/16   Freddrick MarchYashika Amin, MD  oxyCODONE-acetaminophen (PERCOCET) 5-325 MG tablet Take 1 tablet by mouth every 8 (eight) hours as needed for severe pain. 08/09/16   Cheryl MemosJason Danyell Shader, MD  Prenatal Multivit-Min-Fe-FA (PRENATAL VITAMINS) 0.8 MG tablet Take 1 tablet by mouth daily. Patient not taking: Reported on 08/06/2016 06/28/16   [place] Bingharlie Pickens, MD    Family History Family History  Problem Relation Age of Onset  . Cancer Mother  pancreatic  . Migraines Mother   . Mental illness Father   . Epilepsy Father   . Migraines Father   . Hypertension Father     Social History Social History  Substance Use Topics  . Smoking status: Current Some Day Smoker    Packs/day: 0.50    Years: 10.00    Types: Cigarettes    Last attempt to quit: 03/20/2016  . Smokeless tobacco: Never Used  . Alcohol use No     Allergies   Patient has no known allergies.   Review of Systems Review of Systems  Constitutional: Positive for chills.  Gastrointestinal: Positive  for abdominal pain.  Genitourinary: Negative for dysuria and hematuria.  Musculoskeletal: Positive for back pain.  Neurological: Positive for headaches.  All other systems reviewed and are negative.    Physical Exam Updated Vital Signs BP 106/59   Pulse 64   LMP 10/23/2015   SpO2 100%   Physical Exam  Constitutional: She is oriented to person, place, and time. She appears well-developed and well-nourished. No distress.  HENT:  Head: Normocephalic and atraumatic.  Eyes: Conjunctivae are normal.  Cardiovascular: Normal rate.   Pulmonary/Chest: Effort normal.  Abdominal: Soft. She exhibits no distension. There is tenderness.  Suprapubic and R sided pelvic abdominal tenderness   Musculoskeletal:  Right and left paraspinal lumbar tenderness  Neurological: She is alert and oriented to person, place, and time.  Skin: Skin is warm and dry.  Psychiatric: She has a normal mood and affect.  Nursing note and vitals reviewed.    ED Treatments / Results  DIAGNOSTIC STUDIES:  Oxygen Saturation is 98% on RA, normal by my interpretation.    COORDINATION OF CARE:  1:14 AM Discussed treatment plan with pt at bedside and pt agreed to plan.  Labs (all labs ordered are listed, but only abnormal results are displayed) Labs Reviewed  URINALYSIS, ROUTINE W REFLEX MICROSCOPIC - Abnormal; Notable for the following:       Result Value   APPearance HAZY (*)    Hgb urine dipstick LARGE (*)    Leukocytes, UA LARGE (*)    All other components within normal limits  URINALYSIS, MICROSCOPIC (REFLEX) - Abnormal; Notable for the following:    Bacteria, UA RARE (*)    Squamous Epithelial / LPF 0-5 (*)    All other components within normal limits    EKG  EKG Interpretation None       Radiology No results found.  Procedures Procedures (including critical care time)  Medications Ordered in ED Medications  oxyCODONE-acetaminophen (PERCOCET/ROXICET) 5-325 MG per tablet 1 tablet (1 tablet  Oral Given 08/09/16 0226)     Initial Impression / Assessment and Plan / ED Course  I have reviewed the triage vital signs and the nursing notes.  Pertinent labs & imaging results that were available during my care of the patient were reviewed by me and considered in my medical decision making (see chart for details).     Here with pain likely from pyelonephritis. Improved with PO narcotics. Not septic. Appears well. Urine shows that the keflex is liekly appropriate antibiotic. Plan for dc with short course of pain meds and pcp follow up to ensure infection clears.   Final Clinical Impressions(s) / ED Diagnoses   Final diagnoses:  Pyelonephritis    New Prescriptions New Prescriptions   OXYCODONE-ACETAMINOPHEN (PERCOCET) 5-325 MG TABLET    Take 1 tablet by mouth every 8 (eight) hours as needed for severe pain.   I personally  performed the services described in this documentation, which was scribed in my presence. The recorded information has been reviewed and is accurate.     Cheryl Memos, MD 08/09/16 [phone]

## 2017-01-17 ENCOUNTER — Emergency Department (HOSPITAL_COMMUNITY)
Admission: EM | Admit: 2017-01-17 | Discharge: 2017-01-17 | Disposition: A | Discharge status: Home / Self Care | Payer: Medicaid Other | Attending: Emergency Medicine | Admitting: Emergency Medicine

## 2017-01-17 DIAGNOSIS — F17210 Nicotine dependence, cigarettes, uncomplicated: Secondary | ICD-10-CM | POA: Diagnosis not present

## 2017-01-17 DIAGNOSIS — M722 Plantar fascial fibromatosis: Secondary | ICD-10-CM | POA: Diagnosis not present

## 2017-01-17 DIAGNOSIS — M79671 Pain in right foot: Secondary | ICD-10-CM | POA: Diagnosis present

## 2017-01-17 MED ORDER — ACETAMINOPHEN 500 MG PO TABS
1000.0000 mg | ORAL_TABLET | Freq: Once | ORAL | Status: AC
  Administered 2017-01-17: 1000 mg via ORAL
  Filled 2017-01-17: qty 2

## 2017-01-17 NOTE — ED Provider Notes (Signed)
[facility] Provider Note   CSN: [id number] Arrival date & time: 01/17/17  1008  By signing my name below, I, Cheryl Holt, attest that this documentation has been prepared under the direction and in the presence of Cheryl Freer, PA-C. Electronically Signed: Leone Payor, Scribe. 01/17/17. 11:12 AM.  History   Chief Complaint Chief Complaint  Patient presents with  . Foot Pain    The history is provided by the patient. No language interpreter was used.     HPI Comments: Cheryl Holt is a 26 y.o. female who presents to the Emergency Department complaining of intermittent pain to the bottoms of bilateral feet that has been ongoing for the past 1 month. She denies known injury or trauma to the affected area. She states she has not paid attention to what makes her foot pain worse. She has not been evaluated for this pain. She has not tried any home remedies or OTC medications.  She describes her pain as feeling "raw and blistered" though she denies any rash or skin changes on her feet. She also notes having intermittent numbness to some parts of her feet. She describes it as a "tingling" sensation. She has not tried any home remedies or OTC medications. She denies any fever, rash, knee or leg pain.   Past Medical History:  Diagnosis Date  . Depression   . Gestational diabetes   . Headache   . Hepatitis C 05/11/2016   [ ]  f/u quant [ ]  cmp  . Infection    UTI    Patient Active Problem List   Diagnosis Date Noted  . Supervision of high-risk pregnancy 05/24/2016  . Hepatitis C 05/11/2016  . Obesity in pregnancy 05/10/2016  . Gestational diabetes 04/24/2016  . Rh negative status during pregnancy 04/04/2016  . GBS bacteriuria 04/04/2016  . History of gestational diabetes mellitus 03/27/2016  . Substance abuse affecting pregnancy in second trimester, antepartum 02/26/2016  . Anxiety and depression 02/26/2016  . Cannabis use disorder, moderate, dependence (HCC) 01/18/2016  .  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 Tobacco use disorder 01/18/2016  . Opioid use disorder, severe, in sustained remission (HCC) 01/18/2016  . 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 CHOLECYSTECTOMY, LAPAROSCOPIC  2014    OB History    Gravida Para Term Preterm AB Living   2 1 1     1    SAB TAB Ectopic Multiple Live Births           1       Home Medications    Prior to Admission medications   Medication Sig Start Date End Date Taking? Authorizing Provider  Butalbital-APAP-Caffeine 50-325-40 MG capsule Take 1 capsule by mouth every other day. Patient not taking: Reported on 08/06/2016 06/20/16   [place] Bing, MD  cephALEXin (KEFLEX) 500 MG capsule Take 1 capsule (500 mg total) by mouth 2 (two) times daily. 08/06/16   Gwyneth Sprout, MD  Cetirizine HCl 10 MG CAPS Take 1 capsule (10 mg total) by mouth daily. Patient not taking: Reported on 08/06/2016 07/05/16   Donette Larry, CNM  FLUoxetine (PROZAC) 40 MG capsule Take 40 mg by mouth at bedtime.     [provider]  fluticasone (FLONASE) 50 MCG/ACT nasal spray Place 2 sprays into both nostrils daily. Patient not taking: Reported on 08/06/2016 07/09/16   Freddrick March, MD  oxyCODONE-acetaminophen (PERCOCET) 5-325 MG tablet Take 1  tablet by mouth every 8 (eight) hours as needed for severe pain. 08/09/16   [place], Barbara Cower, MD  Prenatal Multivit-Min-Fe-FA (PRENATAL VITAMINS) 0.8 MG tablet Take 1 tablet by mouth daily. Patient not taking: Reported on 08/06/2016 06/28/16   [place] Bing, MD    Family History Family History  Problem Relation Age of Onset  . Cancer Mother        pancreatic  . Migraines Mother   . Mental illness Father   . Epilepsy Father   . Migraines Father   . Hypertension Father     Social History Social History  Substance Use Topics  . Smoking status:  Current Some Day Smoker    Packs/day: 0.50    Years: 10.00    Types: Cigarettes    Last attempt to quit: 03/20/2016  . Smokeless tobacco: Never Used  . Alcohol use No     Allergies   Patient has no known allergies.   Review of Systems Review of Systems  Musculoskeletal: Positive for arthralgias.  Skin: Negative for color change, rash and wound.     Physical Exam Updated Vital Signs BP 126/69 (BP Location: Left Arm)   Pulse 82   Temp 97.7 F (36.5 C) (Oral)   Resp 20   Ht 5\' 4"  (1.626 m)   Wt 108.9 kg (240 lb)   SpO2 99%   BMI 41.20 kg/m   Physical Exam  Constitutional: She appears well-developed and well-nourished.  Sitting comfortably on examination table  HENT:  Head: Normocephalic and atraumatic.  Eyes: Conjunctivae and EOM are normal. Right eye exhibits no discharge. Left eye exhibits no discharge. No scleral icterus.  Cardiovascular:  Pulses:      Dorsalis pedis pulses are 2+ on the right side, and 2+ on the left side.  Pulmonary/Chest: Effort normal.  Musculoskeletal: Normal range of motion. She exhibits tenderness. She exhibits no edema or deformity.  Tenderness to palpation to plantar surface of feet bilaterally. Pain is reproduced with dorsiflexion of the great toe. Full ROM of bilateral ankles. No soft tissue swelling, erythema, warmth. Full ROM of toes. No tenderness to palpation of the ankles or calves. No bilateral lower extremity edema. No deformity noted.   Neurological: She is alert.  5/5 strength BLE Sensation intact to all major nerve distributions of the foot. Negative [place]i  Skin: Skin is warm and dry. Capillary refill takes less than 2 seconds.  Bilateral feet are warm and dry. No pallor or dusky appearance. Normal cap refill bilaterally. No evidence of rash or wound to the soles of the feet.   Psychiatric: She has a normal mood and affect. Her speech is normal and behavior is normal.  Nursing note and vitals reviewed.    ED Treatments /  Results  DIAGNOSTIC STUDIES: Oxygen Saturation is 98% on RA, normal by my interpretation.    COORDINATION OF CARE: 11:08 AM Discussed treatment plan with pt at bedside and pt agreed to plan.   Labs (all labs ordered are listed, but only abnormal results are displayed) Labs Reviewed - No data to display  EKG  EKG Interpretation None       Radiology No results found.  Procedures Procedures (including critical care time)  Medications Ordered in ED Medications  acetaminophen (TYLENOL) tablet 1,000 mg (1,000 mg Oral Given 01/17/17 1116)     Initial Impression / Assessment and Plan / ED Course  I have reviewed the triage vital signs and the nursing notes.  Pertinent labs & imaging results that were available  during my care of the patient were reviewed by me and considered in my medical decision making (see chart for details).     26 y.o. F who presents with 1 month of bilateral foot pain localized to the bottom of her feet. Patient is afebrile, non-toxic appearing, sitting comfortably on examination table.Vital signs reviewed. Patient is slightly hypertensive and tachycardiac, likely secondary to pain. Analgesics given in the department. Will recheck vitals. Patient is neurovascularly intact. Consider plantar fascitis vs muscular pain. Likely worsened by like of at home medication use.  History/physical exam are not concerning for fracture or dislocation or neuropathy. Will plan to treat with conservative therapy. Discussed plan with patient and she is in agreement. Provided patient with a list of clinic resources to use if he does not have a PCP. Instructed to call them today to arrange follow-up in the next 24-48 hours. Strict return precautions discussed. Patient expresses understanding and agreement to plan.    Final Clinical Impressions(s) / ED Diagnoses   Final diagnoses:  Plantar fasciitis    New Prescriptions Discharge Medication List as of 01/17/2017 11:11 AM     I  personally performed the services described in this documentation, which was scribed in my presence. The recorded information has been reviewed and is accurate.    Maxwell CaulLayden, Lindsey A, PA-C 01/17/17 2126    Doug SouJacubowitz, Sam, MD 01/20/17 [phone]

## 2017-01-17 NOTE — ED Triage Notes (Signed)
Received an epidural for during her pregnancy 5 months ago and developed bilateral greater toes pain. Since then pain worsening overtime.

## 2017-01-17 NOTE — Discharge Instructions (Signed)
You can take Tylenol or Ibuprofen as directed for pain.  As we discussed you can freeze a water bottle and roll over it with your feet to help relieve the pain.   Wear proper fitting shoes.   Follow-up with one of the referred clinics below for further evaluation.   Return to the Emergency Department for any worsening pain, redness/swelling, fever, difficulty walking or any other concerns.   If you do not have a primary care doctor you see regularly, please you the list below. Please call them to arrange for follow-up.    No Primary Care Doctor [facility]  [phone] Other agencies that provide inexpensive medical care    [facility]  [phone]    [facility]  [phone]    [facility]  [phone]    [facility]  [phone]    [facility]  [phone]    [facility]  [phone]

## 2017-01-17 NOTE — ED Notes (Signed)
Pt c/o bilateral great toe numbness and bilateral "burning" to bottom of feet. States has had this since having an epidural 5 months ago.

## 2017-01-24 ENCOUNTER — Emergency Department (HOSPITAL_COMMUNITY)
Admission: EM | Admit: 2017-01-24 | Discharge: 2017-01-25 | Disposition: A | Discharge status: Home / Self Care | Payer: Medicaid Other | Attending: Emergency Medicine | Admitting: Emergency Medicine

## 2017-01-24 ENCOUNTER — Encounter (HOSPITAL_COMMUNITY): Payer: Self-pay

## 2017-01-24 DIAGNOSIS — F17210 Nicotine dependence, cigarettes, uncomplicated: Secondary | ICD-10-CM | POA: Insufficient documentation

## 2017-01-24 DIAGNOSIS — Z79899 Other long term (current) drug therapy: Secondary | ICD-10-CM | POA: Diagnosis not present

## 2017-01-24 DIAGNOSIS — F1910 Other psychoactive substance abuse, uncomplicated: Secondary | ICD-10-CM | POA: Insufficient documentation

## 2017-01-24 LAB — I-STAT CHEM 8, ED
BUN: 7 mg/dL (ref 6–20)
CHLORIDE: 104 mmol/L (ref 101–111)
Calcium, Ion: 1.15 mmol/L (ref 1.15–1.40)
Creatinine, Ser: 0.8 mg/dL (ref 0.44–1.00)
Glucose, Bld: 88 mg/dL (ref 65–99)
HEMATOCRIT: 37 % (ref 36.0–46.0)
Hemoglobin: 12.6 g/dL (ref 12.0–15.0)
POTASSIUM: 4 mmol/L (ref 3.5–5.1)
SODIUM: 140 mmol/L (ref 135–145)
TCO2: 25 mmol/L (ref 0–100)

## 2017-01-24 NOTE — ED Notes (Signed)
Pt ambulatory to bathroom, without difficulty. Urine sample at bedside.   Pt given ice water per request. In NAD at this time.

## 2017-01-24 NOTE — ED Provider Notes (Signed)
[facility] Provider Note   CSN: [id number] Arrival date & time: 01/24/17  1950     History   Chief Complaint Chief Complaint  Patient presents with  . Drug Problem    HPI Cheryl Holt is a 26 y.o. female.  HPI  26 year old female with a history of polysubstance abuse presents to the ED with burning sensation all over her body. Patient reports injecting MDMA yesterday and since then has been feeling "not herself." And feels like the MDMA is coming out of her pores which has "changed in her skin color." The patient also reported swelling of the legs which has since improved. Denies any nausea, vomiting, diarrhea, chest pain, shortness of breath, fever, chills. No alleviating or aggravating factors.  Past Medical History:  Diagnosis Date  . Depression   . Gestational diabetes   . Headache   . Hepatitis C 05/11/2016   [ ]  f/u quant [ ]  cmp  . Infection    UTI    Patient Active Problem List   Diagnosis Date Noted  . Supervision of high-risk pregnancy 05/24/2016  . Hepatitis C 05/11/2016  . Obesity in pregnancy 05/10/2016  . Gestational diabetes 04/24/2016  . Rh negative status during pregnancy 04/04/2016  . GBS bacteriuria 04/04/2016  . History of gestational diabetes mellitus 03/27/2016  . Substance abuse affecting pregnancy in second trimester, antepartum 02/26/2016  . Anxiety and depression 02/26/2016  . Cannabis use disorder, moderate, dependence (HCC) 01/18/2016  . 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 Tobacco use disorder 01/18/2016  . Opioid use disorder, severe, in sustained remission (HCC) 01/18/2016  . 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 CHOLECYSTECTOMY, LAPAROSCOPIC  2014    OB History    Gravida Para Term Preterm AB Living   2 1 1     1    SAB TAB Ectopic Multiple Live Births           1       Home  Medications    Prior to Admission medications   Medication Sig Start Date End Date Taking? Authorizing Provider  Cetirizine HCl 10 MG CAPS Take 1 capsule (10 mg total) by mouth daily. 07/05/16  Yes Donette LarryBhambri, Melanie, CNM  DiphenhydrAMINE HCl (BENADRYL ALLERGY PO) Take 1 tablet by mouth as needed (allergy). Gel-cap   Yes [provider]  fluticasone (FLONASE) 50 MCG/ACT nasal spray Place 2 sprays into both nostrils daily. 07/09/16  Yes Freddrick MarchAmin, Yashika, MD  Prenatal Multivit-Min-Fe-FA (PRENATAL VITAMINS) 0.8 MG tablet Take 1 tablet by mouth daily. Patient taking differently: Take 1 tablet by mouth as needed.  06/28/16  Yes [place] BingPickens, Charlie, MD  Butalbital-APAP-Caffeine [phone] MG capsule Take 1 capsule by mouth every other day. Patient not taking: Reported on 08/06/2016 06/20/16   [place] BingPickens, Charlie, MD  cephALEXin (KEFLEX) 500 MG capsule Take 1 capsule (500 mg total) by mouth 2 (two) times daily. Patient not taking: Reported on 01/24/2017 08/06/16   Gwyneth SproutPlunkett, Whitney, MD  oxyCODONE-acetaminophen (PERCOCET) 5-325 MG tablet Take 1 tablet by mouth every 8 (eight) hours as needed for severe pain. Patient not taking: Reported on 01/24/2017 08/09/16   Mesner, Barbara CowerJason, MD    Family History Family History  Problem Relation Age of Onset  . Cancer Mother        pancreatic  . Migraines Mother   . Mental illness Father   . Epilepsy Father   . Migraines  Father   . Hypertension Father     Social History Social History  Substance Use Topics  . Smoking status: Current Some Day Smoker    Packs/day: 0.50    Years: 10.00    Types: Cigarettes    Last attempt to quit: 03/20/2016  . Smokeless tobacco: Never Used  . Alcohol use No     Allergies   Patient has no known allergies.   Review of Systems Review of Systems All other systems are reviewed and are negative for acute change except as noted in the HPI   Physical Exam Updated Vital Signs BP 119/66   Pulse (!) 104   Temp 98.4 F (36.9 C)  (Oral)   Resp 13   LMP 01/10/2017   SpO2 96%   Physical Exam  Constitutional: She is oriented to person, place, and time. She appears well-developed and well-nourished. No distress.  HENT:  Head: Normocephalic and atraumatic.  Nose: Nose normal.  Eyes: Conjunctivae and EOM are normal. Pupils are equal, round, and reactive to light. Right eye exhibits no discharge. Left eye exhibits no discharge. No scleral icterus.  Neck: Normal range of motion. Neck supple.  Cardiovascular: Normal rate and regular rhythm.  Exam reveals no gallop and no friction rub.   No murmur heard. Pulmonary/Chest: Effort normal and breath sounds normal. No stridor. No respiratory distress. She has no rales.  Abdominal: Soft. She exhibits no distension. There is no tenderness.  Musculoskeletal: She exhibits no edema or tenderness.  Neurological: She is alert and oriented to person, place, and time.  Skin: Skin is warm and dry. No rash noted. She is not diaphoretic. No erythema.  Psychiatric: She has a normal mood and affect.  Vitals reviewed.    ED Treatments / Results  Labs (all labs ordered are listed, but only abnormal results are displayed) Labs Reviewed  I-STAT CHEM 8, ED    EKG  EKG Interpretation None       Radiology No results found.  Procedures Procedures (including critical care time)  Medications Ordered in ED Medications - No data to display   Initial Impression / Assessment and Plan / ED Course  I have reviewed the triage vital signs and the nursing notes.  Pertinent labs & imaging results that were available during my care of the patient were reviewed by me and considered in my medical decision making (see chart for details).     EKG without any evidence of acute ischemia, dysrhythmia, blocks. Labs without any electrolyte derangement. The patient is well-appearing, well-hydrated, nontoxic. She is afebrile with stable vital signs. No evidence to suggest serious/emergent etiology  at this time.  The patient is safe for discharge with strict return precautions.   Final Clinical Impressions(s) / ED Diagnoses   Final diagnoses:  Substance abuse   Disposition: Discharge  Condition: Good  I have discussed the results, Dx and Tx plan with the patient who expressed understanding and agree(s) with the plan. Discharge instructions discussed at great length. The patient was given strict return precautions who verbalized understanding of the instructions. No further questions at time of discharge.    New Prescriptions   No medications on file    Follow Up: primary care provider  Schedule an appointment as soon as possible for a visit        Geral Tuch, Amadeo Garnet, MD 01/25/17 0003

## 2017-01-24 NOTE — ED Triage Notes (Signed)
Pt reports she injected MDMA into her vein last night and today she is experiencing burning to her entire body.

## 2017-01-24 NOTE — ED Notes (Signed)
Pt reporting a myriad of complaints/reactions since "shooting MDMA" yesterday (7/4) afternoon. Generalized burning sensation throughout her body. States her skin is changing color and references her L great toe having "turned black last night". SO at bedside says her throat was swollen to 3x normal size last night.   Airway patent with adequate respirations and clear LS throughout.

## 2017-01-24 NOTE — ED Notes (Signed)
ED Provider at bedside. 

## 2017-01-25 NOTE — ED Notes (Signed)
Pt and SO departed prior to reviewing D/C information with them. Pt calm and in NAD at last contact.

## 2017-02-14 ENCOUNTER — Encounter (HOSPITAL_COMMUNITY): Payer: Self-pay

## 2017-02-14 ENCOUNTER — Emergency Department (HOSPITAL_COMMUNITY)
Admission: EM | Admit: 2017-02-14 | Discharge: 2017-02-14 | Disposition: A | Discharge status: Home / Self Care | Payer: Medicaid Other | Attending: Emergency Medicine | Admitting: Emergency Medicine

## 2017-02-14 DIAGNOSIS — Z79899 Other long term (current) drug therapy: Secondary | ICD-10-CM | POA: Diagnosis not present

## 2017-02-14 DIAGNOSIS — R21 Rash and other nonspecific skin eruption: Secondary | ICD-10-CM | POA: Insufficient documentation

## 2017-02-14 DIAGNOSIS — F17210 Nicotine dependence, cigarettes, uncomplicated: Secondary | ICD-10-CM | POA: Insufficient documentation

## 2017-02-14 MED ORDER — HYDROXYZINE HCL 25 MG PO TABS
25.0000 mg | ORAL_TABLET | Freq: Once | ORAL | Status: AC
  Administered 2017-02-14: 25 mg via ORAL
  Filled 2017-02-14: qty 1

## 2017-02-14 MED ORDER — PERMETHRIN 5 % EX CREA: TOPICAL_CREAM | CUTANEOUS | 0 refills | Status: DC

## 2017-02-14 NOTE — Discharge Instructions (Addendum)
Apply medication to entire body and wash off after 8 hours Wash all clothing and bedding in hot water or place in airtight bag for 3 days Take Benadryl for itching Return for worsening symptoms

## 2017-02-14 NOTE — ED Provider Notes (Signed)
[facility] Provider Note   CSN: [id number] Arrival date & time: 02/14/17  1110     History   Chief Complaint Chief Complaint  Patient presents with  . Rash    HPI Cheryl Holt is a 26 y.o. female who presents with a rash. PMH significant for polysubstance abuse. She states the rash has been present for 2 days. It is all over her body. The rash is very itchy and is making her anxious. No fever. She has brought her daughter in to be evaluated as well who was diagnosed with scabies.   HPI  Past Medical History:  Diagnosis Date  . Depression   . Gestational diabetes   . Headache   . Hepatitis C 05/11/2016   [ ]  f/u quant [ ]  cmp  . Infection    UTI    Patient Active Problem List   Diagnosis Date Noted  . Supervision of high-risk pregnancy 05/24/2016  . Hepatitis C 05/11/2016  . Obesity in pregnancy 05/10/2016  . Gestational diabetes 04/24/2016  . Rh negative status during pregnancy 04/04/2016  . GBS bacteriuria 04/04/2016  . History of gestational diabetes mellitus 03/27/2016  . Substance abuse affecting pregnancy in second trimester, antepartum 02/26/2016  . Anxiety and depression 02/26/2016  . Cannabis use disorder, moderate, dependence (HCC) 01/18/2016  . 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 Tobacco use disorder 01/18/2016  . Opioid use disorder, severe, in sustained remission (HCC) 01/18/2016  . 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 CHOLECYSTECTOMY, LAPAROSCOPIC  2014    OB History    Gravida Para Term Preterm AB Living   2 1 1     1    SAB TAB Ectopic Multiple Live Births           1       Home Medications    Prior to Admission medications   Medication Sig Start Date End Date Taking? Authorizing Provider  Butalbital-APAP-Caffeine 50-325-40 MG capsule Take 1 capsule by mouth every other day. Patient  not taking: Reported on 08/06/2016 06/20/16   [place] BingPickens, Charlie, MD  cephALEXin (KEFLEX) 500 MG capsule Take 1 capsule (500 mg total) by mouth 2 (two) times daily. Patient not taking: Reported on 01/24/2017 08/06/16   Gwyneth SproutPlunkett, Whitney, MD  Cetirizine HCl 10 MG CAPS Take 1 capsule (10 mg total) by mouth daily. 07/05/16   Donette LarryBhambri, Melanie, CNM  DiphenhydrAMINE HCl (BENADRYL ALLERGY PO) Take 1 tablet by mouth as needed (allergy). Gel-cap    [provider]  fluticasone (FLONASE) 50 MCG/ACT nasal spray Place 2 sprays into both nostrils daily. 07/09/16   Freddrick MarchAmin, Yashika, MD  oxyCODONE-acetaminophen (PERCOCET) 5-325 MG tablet Take 1 tablet by mouth every 8 (eight) hours as needed for severe pain. Patient not taking: Reported on 01/24/2017 08/09/16   Mesner, Barbara CowerJason, MD  permethrin (ELIMITE) 5 % cream Apply to affected area once, wash off after 8 hours 02/14/17   Bethel BornGekas, Naesha Buckalew Marie, PA-C  Prenatal Multivit-Min-Fe-FA (PRENATAL VITAMINS) 0.8 MG tablet Take 1 tablet by mouth daily. Patient taking differently: Take 1 tablet by mouth as needed.  06/28/16   [place] BingPickens, Charlie, MD    Family History Family History  Problem Relation Age of Onset  . Cancer Mother        pancreatic  . Migraines Mother   . Mental illness Father   . Epilepsy Father   . Migraines Father   .  Hypertension Father     Social History Social History  Substance Use Topics  . Smoking status: Current Some Day Smoker    Packs/day: 0.50    Years: 10.00    Types: Cigarettes    Last attempt to quit: 03/20/2016  . Smokeless tobacco: Never Used  . Alcohol use No     Allergies   Patient has no known allergies.   Review of Systems Review of Systems  Constitutional: Negative for fever.  Skin: Positive for rash.  Psychiatric/Behavioral: The patient is nervous/anxious.      Physical Exam Updated Vital Signs BP 127/89 (BP Location: Left Arm)   Pulse 86   Temp 98.1 F (36.7 C) (Oral)   Resp 14   LMP 02/10/2017 (Within Days)    SpO2 99%   Physical Exam  Constitutional: She is oriented to person, place, and time. She appears well-developed and well-nourished. No distress.  Obese, irritable  HENT:  Head: Normocephalic and atraumatic.  Eyes: Pupils are equal, round, and reactive to light. Conjunctivae are normal. Right eye exhibits no discharge. Left eye exhibits no discharge. No scleral icterus.  Neck: Normal range of motion.  Cardiovascular: Normal rate.   Pulmonary/Chest: Effort normal. No respiratory distress.  Abdominal: She exhibits no distension.  Neurological: She is alert and oriented to person, place, and time.  Skin: Skin is warm and dry.  Generalized papular rash on torso, arms, legs  Psychiatric: Her behavior is normal. Her mood appears anxious.  Nursing note and vitals reviewed.    ED Treatments / Results  Labs (all labs ordered are listed, but only abnormal results are displayed) Labs Reviewed - No data to display  EKG  EKG Interpretation None       Radiology No results found.  Procedures Procedures (including critical care time)  Medications Ordered in ED Medications  hydrOXYzine (ATARAX/VISTARIL) tablet 25 mg (25 mg Oral Given 02/14/17 1205)     Initial Impression / Assessment and Plan / ED Course  I have reviewed the triage vital signs and the nursing notes.  Pertinent labs & imaging results that were available during my care of the patient were reviewed by me and considered in my medical decision making (see chart for details).  26 year old female with rash consistent with scabies. Rx for Permethrin given. She is requesting something for anxiety. Hydroxyzine given. Advised Benadryl for itching prn and to wash all clothes and bedding. Also advised establish care with PCP.  Final Clinical Impressions(s) / ED Diagnoses   Final diagnoses:  Rash and nonspecific skin eruption    New Prescriptions Discharge Medication List as of 02/14/2017 12:02 PM    START taking these  medications   Details  permethrin (ELIMITE) 5 % cream Apply to affected area once, wash off after 8 hours, Print         Bethel BornGekas, Lumir Demetriou Marie, PA-C 02/14/17 1422    Jacalyn LefevreHaviland, Julie, MD 02/14/17 1524

## 2017-02-14 NOTE — ED Triage Notes (Signed)
Pt presents for evaluation of generalized rash since Monday. Mother reports itching and redness. No meds PTA.

## 2017-05-03 ENCOUNTER — Ambulatory Visit (HOSPITAL_COMMUNITY)
Admission: EM | Admit: 2017-05-03 | Discharge: 2017-05-03 | Disposition: A | Discharge status: Home / Self Care | Payer: Medicaid Other | Attending: Family Medicine | Admitting: Family Medicine

## 2017-05-03 ENCOUNTER — Encounter (HOSPITAL_COMMUNITY): Payer: Self-pay

## 2017-05-03 DIAGNOSIS — L0291 Cutaneous abscess, unspecified: Secondary | ICD-10-CM

## 2017-05-03 MED ORDER — SULFAMETHOXAZOLE-TRIMETHOPRIM 800-160 MG PO TABS: 1.0000 | ORAL_TABLET | Freq: Two times a day (BID) | ORAL | 0 refills | Status: DC

## 2017-05-03 NOTE — ED Triage Notes (Signed)
Patient presents to [facility] with abscess on right wrist x1 week

## 2017-05-08 NOTE — ED Provider Notes (Signed)
  [facility]   [id number] 05/03/17 Arrival Time: 1717  ASSESSMENT & PLAN:  1. Abscess     Meds ordered this encounter  Medications  . sulfamethoxazole-trimethoprim (BACTRIM DS,SEPTRA DS) 800-160 MG tablet    Sig: Take 1 tablet by mouth 2 (two) times daily.    Dispense:  20 tablet    Refill:  0    Procedure: Verbal consent obtained. Area over induration cleaned with betadine. Lidocaine 2% without epinephrine used to obtain local anesthesia. The most fluctuant portion of the abscess was incised with a #11 blade scalpel. Abscess cavity explored and evacuated. Loculations broken up with a curved hemostat as best as possible given patient discomfort. No packing. Dressed with a clean gauze dressing. Minimal bleeding. No complications.  Wound care instructions discussed and given in written format. To return in 48 hours for wound check.  Finish all antibiotics. OTC analgesics as needed.  Reviewed expectations re: course of current medical issues. Questions answered. Outlined signs and symptoms indicating need for more acute intervention. Patient verbalized understanding. After Visit Summary given.   SUBJECTIVE:  Cheryl Holt is a 26 y.o. female who presents with a possible abscess of her L forearm. Present for about one week. Tender. No drainage. Afebrile. No self treatment. No specific aggravating or alleviating factors reported.  ROS: As per HPI.  OBJECTIVE:  Vitals:   05/03/17 1745  BP: 110/76  Pulse: (!) 108  Resp: 17  Temp: 98 F (36.7 C)  TempSrc: Oral  SpO2: 100%     General appearance: alert; no distress Skin: 2 cm induration over L inner forearm; tender; no drainage Psychological:  alert and cooperative; normal mood and affect   No Known Allergies  Past Medical History:  Diagnosis Date  . Depression   . Gestational diabetes   . Headache   . Hepatitis C 05/11/2016   [ ]  f/u quant [ ]  cmp  . Infection    UTI    Past Surgical History:    Procedure Laterality Date  . CHOLECYSTECTOMY, LAPAROSCOPIC  2014           Mardella LaymanHagler, Cheryl Tufo, MD 05/08/17 [phone]

## 2017-05-20 ENCOUNTER — Emergency Department (HOSPITAL_COMMUNITY)
Admission: EM | Admit: 2017-05-20 | Discharge: 2017-05-20 | Disposition: A | Discharge status: Home / Self Care | Payer: Medicaid Other | Attending: Emergency Medicine | Admitting: Emergency Medicine

## 2017-05-20 DIAGNOSIS — O99331 Smoking (tobacco) complicating pregnancy, first trimester: Secondary | ICD-10-CM | POA: Insufficient documentation

## 2017-05-20 DIAGNOSIS — Z3A14 14 weeks gestation of pregnancy: Secondary | ICD-10-CM

## 2017-05-20 DIAGNOSIS — F1910 Other psychoactive substance abuse, uncomplicated: Secondary | ICD-10-CM | POA: Diagnosis not present

## 2017-05-20 DIAGNOSIS — F1410 Cocaine abuse, uncomplicated: Secondary | ICD-10-CM | POA: Insufficient documentation

## 2017-05-20 DIAGNOSIS — O99321 Drug use complicating pregnancy, first trimester: Secondary | ICD-10-CM | POA: Insufficient documentation

## 2017-05-20 DIAGNOSIS — F410 Panic disorder [episodic paroxysmal anxiety] without agoraphobia: Secondary | ICD-10-CM | POA: Insufficient documentation

## 2017-05-20 DIAGNOSIS — O99341 Other mental disorders complicating pregnancy, first trimester: Secondary | ICD-10-CM | POA: Diagnosis present

## 2017-05-20 DIAGNOSIS — F1610 Hallucinogen abuse, uncomplicated: Secondary | ICD-10-CM

## 2017-05-20 DIAGNOSIS — Z79899 Other long term (current) drug therapy: Secondary | ICD-10-CM | POA: Insufficient documentation

## 2017-05-20 DIAGNOSIS — F17210 Nicotine dependence, cigarettes, uncomplicated: Secondary | ICD-10-CM | POA: Diagnosis not present

## 2017-05-20 DIAGNOSIS — F419 Anxiety disorder, unspecified: Secondary | ICD-10-CM

## 2017-05-20 LAB — URINALYSIS, ROUTINE W REFLEX MICROSCOPIC
Bacteria, UA: NONE SEEN
Bilirubin Urine: NEGATIVE
Glucose, UA: NEGATIVE mg/dL
Hgb urine dipstick: NEGATIVE
KETONES UR: 80 mg/dL — AB
Leukocytes, UA: NEGATIVE
Nitrite: NEGATIVE
PROTEIN: 30 mg/dL — AB
Specific Gravity, Urine: 1.024 (ref 1.005–1.030)
pH: 5 (ref 5.0–8.0)

## 2017-05-20 LAB — CBC WITH DIFFERENTIAL/PLATELET
BASOS ABS: 0 10*3/uL (ref 0.0–0.1)
BASOS PCT: 0 %
Eosinophils Absolute: 0.1 10*3/uL (ref 0.0–0.7)
Eosinophils Relative: 1 %
HEMATOCRIT: 38.5 % (ref 36.0–46.0)
HEMOGLOBIN: 13.2 g/dL (ref 12.0–15.0)
LYMPHS PCT: 18 %
Lymphs Abs: 1.8 10*3/uL (ref 0.7–4.0)
MCH: 30.1 pg (ref 26.0–34.0)
MCHC: 34.3 g/dL (ref 30.0–36.0)
MCV: 87.9 fL (ref 78.0–100.0)
MONOS PCT: 7 %
Monocytes Absolute: 0.7 10*3/uL (ref 0.1–1.0)
NEUTROS ABS: 7.1 10*3/uL (ref 1.7–7.7)
NEUTROS PCT: 74 %
Platelets: 248 10*3/uL (ref 150–400)
RBC: 4.38 MIL/uL (ref 3.87–5.11)
RDW: 13.2 % (ref 11.5–15.5)
WBC: 9.7 10*3/uL (ref 4.0–10.5)

## 2017-05-20 LAB — RAPID URINE DRUG SCREEN, HOSP PERFORMED
Amphetamines: NOT DETECTED
BARBITURATES: NOT DETECTED
BENZODIAZEPINES: NOT DETECTED
COCAINE: POSITIVE — AB
Opiates: NOT DETECTED
TETRAHYDROCANNABINOL: POSITIVE — AB

## 2017-05-20 LAB — ACETAMINOPHEN LEVEL: Acetaminophen (Tylenol), Serum: <10 ug/mL — ABNORMAL LOW (ref 10–30)

## 2017-05-20 LAB — COMPREHENSIVE METABOLIC PANEL
ALK PHOS: 52 U/L (ref 38–126)
ALT: 35 U/L (ref 14–54)
ANION GAP: 9 (ref 5–15)
AST: 23 U/L (ref 15–41)
Albumin: 3.6 g/dL (ref 3.5–5.0)
BUN: 7 mg/dL (ref 6–20)
CALCIUM: 9.2 mg/dL (ref 8.9–10.3)
CO2: 22 mmol/L (ref 22–32)
Chloride: 104 mmol/L (ref 101–111)
Creatinine, Ser: 0.71 mg/dL (ref 0.44–1.00)
GFR calc non Af Amer: >60 mL/min (ref >60)
Glucose, Bld: 83 mg/dL (ref 65–99)
Potassium: 3.5 mmol/L (ref 3.5–5.1)
SODIUM: 135 mmol/L (ref 135–145)
TOTAL PROTEIN: 7 g/dL (ref 6.5–8.1)
Total Bilirubin: 1.3 mg/dL — ABNORMAL HIGH (ref 0.3–1.2)

## 2017-05-20 LAB — PREGNANCY, URINE: PREG TEST UR: POSITIVE — AB

## 2017-05-20 LAB — ETHANOL: Alcohol, Ethyl (B): <10 mg/dL (ref <10)

## 2017-05-20 LAB — HCG, QUANTITATIVE, PREGNANCY: hCG, Beta Chain, Quant, S: 53435 m[IU]/mL — ABNORMAL HIGH (ref <5)

## 2017-05-20 MED ORDER — PRENATAL MULTIVITAMIN CH: 1.0000 | ORAL_TABLET | Freq: Every day | ORAL | 0 refills | Status: AC

## 2017-05-20 MED ORDER — ONDANSETRON HCL 4 MG PO TABS: 4.0000 mg | ORAL_TABLET | Freq: Three times a day (TID) | ORAL | Status: DC | PRN

## 2017-05-20 MED ORDER — ACETAMINOPHEN 325 MG PO TABS: 650.0000 mg | ORAL_TABLET | Freq: Four times a day (QID) | ORAL | Status: DC | PRN

## 2017-05-20 NOTE — Progress Notes (Addendum)
8:47pm-CSW spoke with pt at bedside where CSW offered support and resources to pt as pt is [redacted] weeks pregnant. Pt rejected the idea of being seen at DSS for more support and help for unborn children as pt is afraid that DSS will take away pt's visitation with pt's other daughter. CSW provided pt with bus passes to seek out wanted services at this time.    CSW acknowledged consult. CSW spoke with doctor and was informed that pt already knows what pt needs to do and mentioned that at this time pt has no needs as they have already been dicussed. CSW signing off. Please re-consult for any new needs.    Claude MangesKierra S. Donna Snooks, MSW, LCSW-A Emergency Department Clinical Social Worker [phone]
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
## 2017-05-20 NOTE — ED Provider Notes (Signed)
EMERGENCY DEPARTMENT US PREGNANCY "Study: Limited Ultrasound of the Pelvis for Pregnancy" INDICATIONS:Pregnancy(required) Multiple views of the uterus and pelvic cavity were obtained in real-time with a multi-frequency probe.  APPROACH:Transabdominal  PERFORMED BY: Myself IMAGES ARCHIVED?: Yes LIMITATIONS: none PREGNANCY FREE FLUID: none ADNEXAL FINDINGS:normal adnexa GESTATIONAL AGE, ESTIMATE: 13 FETAL HEART RATE: 148 INTERPRETATION: Fetal pole present and Fetal heart activity seen    Well appearing. No HI or SI. Outpatient resources given. Will need OB follow up       Azalia Bilisampos, Courtland Reas, MD 05/20/17 [phone]
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
## 2017-05-20 NOTE — Discharge Instructions (Addendum)
Start taking the prenatal vitamins. STOP DOING DRUGS!!!
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
## 2017-05-28 ENCOUNTER — Inpatient Hospital Stay (HOSPITAL_COMMUNITY): Payer: Medicaid Other

## 2017-05-28 ENCOUNTER — Encounter (HOSPITAL_COMMUNITY): Payer: Self-pay | Admitting: *Deleted

## 2017-05-28 ENCOUNTER — Inpatient Hospital Stay (HOSPITAL_COMMUNITY)
Admission: AD | Admit: 2017-05-28 | Discharge: 2017-05-28 | Disposition: A | Discharge status: Home / Self Care | Payer: Medicaid Other | Source: Ambulatory Visit | Attending: Obstetrics & Gynecology | Admitting: Obstetrics & Gynecology

## 2017-05-28 DIAGNOSIS — O4692 Antepartum hemorrhage, unspecified, second trimester: Secondary | ICD-10-CM | POA: Diagnosis not present

## 2017-05-28 DIAGNOSIS — O4690 Antepartum hemorrhage, unspecified, unspecified trimester: Secondary | ICD-10-CM

## 2017-05-28 DIAGNOSIS — Z6791 Unspecified blood type, Rh negative: Secondary | ICD-10-CM | POA: Diagnosis not present

## 2017-05-28 DIAGNOSIS — Z3A15 15 weeks gestation of pregnancy: Secondary | ICD-10-CM | POA: Diagnosis not present

## 2017-05-28 DIAGNOSIS — O30002 Twin pregnancy, unspecified number of placenta and unspecified number of amniotic sacs, second trimester: Secondary | ICD-10-CM

## 2017-05-28 DIAGNOSIS — O99342 Other mental disorders complicating pregnancy, second trimester: Secondary | ICD-10-CM | POA: Diagnosis not present

## 2017-05-28 DIAGNOSIS — O99332 Smoking (tobacco) complicating pregnancy, second trimester: Secondary | ICD-10-CM | POA: Insufficient documentation

## 2017-05-28 DIAGNOSIS — O26899 Other specified pregnancy related conditions, unspecified trimester: Secondary | ICD-10-CM

## 2017-05-28 DIAGNOSIS — F17210 Nicotine dependence, cigarettes, uncomplicated: Secondary | ICD-10-CM | POA: Insufficient documentation

## 2017-05-28 DIAGNOSIS — B1920 Unspecified viral hepatitis C without hepatic coma: Secondary | ICD-10-CM | POA: Insufficient documentation

## 2017-05-28 DIAGNOSIS — O98412 Viral hepatitis complicating pregnancy, second trimester: Secondary | ICD-10-CM | POA: Diagnosis not present

## 2017-05-28 DIAGNOSIS — O209 Hemorrhage in early pregnancy, unspecified: Secondary | ICD-10-CM | POA: Diagnosis not present

## 2017-05-28 DIAGNOSIS — R109 Unspecified abdominal pain: Secondary | ICD-10-CM | POA: Diagnosis not present

## 2017-05-28 DIAGNOSIS — F329 Major depressive disorder, single episode, unspecified: Secondary | ICD-10-CM | POA: Diagnosis not present

## 2017-05-28 DIAGNOSIS — N930 Postcoital and contact bleeding: Secondary | ICD-10-CM

## 2017-05-28 DIAGNOSIS — Z79899 Other long term (current) drug therapy: Secondary | ICD-10-CM | POA: Diagnosis not present

## 2017-05-28 DIAGNOSIS — N939 Abnormal uterine and vaginal bleeding, unspecified: Secondary | ICD-10-CM

## 2017-05-28 LAB — URINALYSIS, ROUTINE W REFLEX MICROSCOPIC
Bilirubin Urine: NEGATIVE
Glucose, UA: NEGATIVE mg/dL
Ketones, ur: NEGATIVE mg/dL
Leukocytes, UA: NEGATIVE
NITRITE: NEGATIVE
Protein, ur: NEGATIVE mg/dL
SPECIFIC GRAVITY, URINE: 1.013 (ref 1.005–1.030)
pH: 7 (ref 5.0–8.0)

## 2017-05-28 LAB — WET PREP, GENITAL
CLUE CELLS WET PREP: NONE SEEN
Sperm: NONE SEEN
TRICH WET PREP: NONE SEEN
Yeast Wet Prep HPF POC: NONE SEEN

## 2017-05-28 LAB — RAPID URINE DRUG SCREEN, HOSP PERFORMED
AMPHETAMINES: NOT DETECTED
Barbiturates: NOT DETECTED
Benzodiazepines: NOT DETECTED
Cocaine: NOT DETECTED
OPIATES: POSITIVE — AB
TETRAHYDROCANNABINOL: POSITIVE — AB

## 2017-05-28 LAB — ABO/RH: ABO/RH(D): B NEG

## 2017-05-28 MED ORDER — RHO D IMMUNE GLOBULIN 1500 UNIT/2ML IJ SOSY
300.0000 ug | PREFILLED_SYRINGE | Freq: Once | INTRAMUSCULAR | Status: AC
  Administered 2017-05-28: 300 ug via INTRAMUSCULAR
  Filled 2017-05-28: qty 2

## 2017-05-28 NOTE — MAU Note (Signed)
Pt reports she had some lower abd on left earlier, now the pain is only when she walks. States there is some blood on the tissue when she wipes.

## 2017-05-28 NOTE — Discharge Instructions (Signed)
·   Schedule prenatal visit  Pelvic rest for the next couple of weeks due to bleeding and low placenta  Vaginal Bleeding During Pregnancy, Second Trimester A small amount of bleeding (spotting) from the vagina is common in pregnancy. Sometimes the bleeding is normal and is not a problem, and sometimes it is a sign of something serious. Be sure to tell your doctor about any bleeding from your vagina right away. Follow these instructions at home:  Watch your condition for any changes.  Follow your doctor's instructions about how active you can be.  If you are on bed rest: ? You may need to stay in bed and only get up to use the bathroom. ? You may be allowed to do some activities. ? If you need help, make plans for someone to help you.  Write down: ? The number of pads you use each day. ? How often you change pads. ? How soaked (saturated) your pads are.  Do not use tampons.  Do not douche.  Do not have sex or orgasms until your doctor says it is okay.  If you pass any tissue from your vagina, save the tissue so you can show it to your doctor.  Only take medicines as told by your doctor.  Do not take aspirin because it can make you bleed.  Do not exercise, lift heavy weights, or do any activities that take a lot of energy and effort unless your doctor says it is okay.  Keep all follow-up visits as told by your doctor. Contact a doctor if:  You bleed from your vagina.  You have cramps.  You have labor pains.  You have a fever that does not go away after you take medicine. Get help right away if:  You have very bad cramps in your back or belly (abdomen).  You have contractions.  You have chills.  You pass large clots or tissue from your vagina.  You bleed more.  You feel light-headed or weak.  You pass out (faint).  You are leaking fluid or have a gush of fluid from your vagina. This information is not intended to replace advice given to you by your health  care provider. Make sure you discuss any questions you have with your health care provider. Document Released: 11/23/2013 Document Revised: 12/15/2015 Document Reviewed: 03/16/2013 Elsevier Interactive Patient Education  [employer]2018 Elsevier Inc[employer]

## 2017-05-28 NOTE — MAU Provider Note (Signed)
Chief Complaint: Vaginal Bleeding and Abdominal Pain   SUBJECTIVE HPI: Cheryl Holt is a 26 y.o. G3P2001 at [redacted]w[redacted]d who presents to MAU with vaginal bleeding and abdominal pain. Patient reports that she is pregnant with twins but has not had any prenatal care or official ultrasounds. She had intercourse today and started bleeding after and having abdominal pain. She is concerned about the well being of the pregnancy. Abdominal pain is sharp and intermittent. Bleeding is light pink spotting and mostly when she wipes. No fevers, n/v/d, headaches, vision changes.   Past Medical History:  Diagnosis Date  . Depression   . Gestational diabetes   . Headache   . Hepatitis C 05/11/2016   [ ]  f/u quant [ ]  cmp  . Infection    UTI   OB History  Gravida Para Term Preterm AB Living  3 2 2     1   SAB TAB Ectopic Multiple Live Births          1    # Outcome Date GA Lbr Len/2nd Weight Sex Delivery Anes PTL Lv  3 Current           2 Term 09/23/11    M Vag-Spont EPI N LIV  1 Term      Vag-Spont        Past Surgical History:  Procedure Laterality Date  . CHOLECYSTECTOMY, LAPAROSCOPIC  2014   Social History   Socioeconomic History  . Marital status: Single    Spouse name: Not on file  . Number of children: Not on file  . Years of education: Not on file  . Highest education level: Not on file  Social Needs  . Financial resource strain: Not on file  . Food insecurity - worry: Not on file  . Food insecurity - inability: Not on file  . Transportation needs - medical: Not on file  . Transportation needs - non-medical: Not on file  Occupational History  . Not on file  Tobacco Use  . Smoking status: Current Some Day Smoker    Packs/day: 0.50    Years: 10.00    Pack years: 5.00    Types: Cigarettes    Last attempt to quit: 03/20/2016    Years since quitting: 1.1  . Smokeless tobacco: Never Used  Substance and Sexual Activity  . Alcohol use: No  . Drug use: Yes    Types: Marijuana  .  Sexual activity: Yes    Birth control/protection: None  Other Topics Concern  . Not on file  Social History Narrative  . Not on file   No current facility-administered medications on file prior to encounter.    Current Outpatient Medications on File Prior to Encounter  Medication Sig Dispense Refill  . Butalbital-APAP-Caffeine 50-325-40 MG capsule Take 1 capsule by mouth every other day. (Patient not taking: Reported on 08/06/2016) 12 capsule 0  . cephALEXin (KEFLEX) 500 MG capsule Take 1 capsule (500 mg total) by mouth 2 (two) times daily. (Patient not taking: Reported on 01/24/2017) 14 capsule 0  . Cetirizine HCl 10 MG CAPS Take 1 capsule (10 mg total) by mouth daily. 30 capsule 0  . DiphenhydrAMINE HCl (BENADRYL ALLERGY PO) Take 1 tablet by mouth as needed (allergy). Gel-cap    . fluticasone (FLONASE) 50 MCG/ACT nasal spray Place 2 sprays into both nostrils daily. 16 g 2  . oxyCODONE-acetaminophen (PERCOCET) 5-325 MG tablet Take 1 tablet by mouth every 8 (eight) hours as needed for severe pain. (Patient not taking: Reported  on 01/24/2017) 10 tablet 0  . permethrin (ELIMITE) 5 % cream Apply to affected area once, wash off after 8 hours 60 g 0  . Prenatal Multivit-Min-Fe-FA (PRENATAL VITAMINS) 0.8 MG tablet Take 1 tablet by mouth daily. (Patient taking differently: Take 1 tablet by mouth as needed. ) 30 tablet 2  . Prenatal Vit-Fe Fumarate-FA (PRENATAL MULTIVITAMIN) TABS tablet Take 1 tablet by mouth daily at 12 noon. 30 tablet 0  . sulfamethoxazole-trimethoprim (BACTRIM DS,SEPTRA DS) 800-160 MG tablet Take 1 tablet by mouth 2 (two) times daily. 20 tablet 0   No Known Allergies  I have reviewed the past Medical Hx, Surgical Hx, Social Hx, Allergies and Medications.   REVIEW OF SYSTEMS All systems reviewed and are negative for acute change except as noted in the HPI.   OBJECTIVE BP 129/76 (BP Location: Right Arm)   Pulse (!) 109   Temp 98.4 F (36.9 C) (Oral)   Resp 16   Ht 5\' 5"   (1.651 m)   Wt 107 kg (236 lb)   LMP 04/22/2017 (Exact Date)   SpO2 99%   BMI 39.27 kg/m    PHYSICAL EXAM Constitutional: Well-developed, well-nourished female in no acute distress.  Cardiovascular: tachycardia, normal rhythm, pulses intact Respiratory: normal rate and effort.  GI: Abd soft, non-tender, non-distended. Fundal height 19cm. MS: Extremities nontender, no edema, normal ROM Neurologic: Alert and oriented x 4. No focal deficits SPECULUM EXAM: NEFG, physiologic discharge, scant blood noted, cervix clean BIMANUAL: cervix closed; uterus enlarged, no adnexal tenderness or masses. No CMT. Psych: normal mood and affect  LAB RESULTS Results for orders placed or performed during the hospital encounter of 05/28/17 (from the past 24 hour(s))  Urinalysis, Routine w reflex microscopic     Status: Abnormal   Collection Time: 05/28/17  5:15 PM  Result Value Ref Range   Color, Urine YELLOW YELLOW   APPearance CLOUDY (A) CLEAR   Specific Gravity, Urine 1.013 1.005 - 1.030   pH 7.0 5.0 - 8.0   Glucose, UA NEGATIVE NEGATIVE mg/dL   Hgb urine dipstick MODERATE (A) NEGATIVE   Bilirubin Urine NEGATIVE NEGATIVE   Ketones, ur NEGATIVE NEGATIVE mg/dL   Protein, ur NEGATIVE NEGATIVE mg/dL   Nitrite NEGATIVE NEGATIVE   Leukocytes, UA NEGATIVE NEGATIVE   RBC / HPF 0-5 0 - 5 RBC/hpf   WBC, UA 0-5 0 - 5 WBC/hpf   Bacteria, UA RARE (A) NONE SEEN   Squamous Epithelial / LPF 6-30 (A) NONE SEEN   Amorphous Crystal PRESENT   Urine rapid drug screen (hosp performed)     Status: Abnormal   Collection Time: 05/28/17  5:15 PM  Result Value Ref Range   Opiates POSITIVE (A) NONE DETECTED   Cocaine NONE DETECTED NONE DETECTED   Benzodiazepines NONE DETECTED NONE DETECTED   Amphetamines NONE DETECTED NONE DETECTED   Tetrahydrocannabinol POSITIVE (A) NONE DETECTED   Barbiturates NONE DETECTED NONE DETECTED  Wet prep, genital     Status: Abnormal   Collection Time: 05/28/17  5:56 PM  Result Value  Ref Range   Yeast Wet Prep HPF POC NONE SEEN NONE SEEN   Trich, Wet Prep NONE SEEN NONE SEEN   Clue Cells Wet Prep HPF POC NONE SEEN NONE SEEN   WBC, Wet Prep HPF POC MODERATE (A) NONE SEEN   Sperm NONE SEEN     IMAGING [place] Mfm Ob Limited  Result Date: 05/29/2017 ----------------------------------------------------------------------  OBSTETRICS REPORT                      (  Signed Final 05/29/2017 08:32 am) ---------------------------------------------------------------------- Patient Info  ID #:       [id number]                          D.O.B.:  [birth year] (25 yrs)  Name:       Toma Copier Treto                   Visit Date: 05/28/2017 06:19 pm ---------------------------------------------------------------------- Performed By  Performed By:     Vivien Rota        Ref. Address:     Faculty Fellow                    RDMS  Attending:        Particia Nearing MD       Secondary Phy.:   MAU Nursing-                                                             MAU/Triage  Referred By:      Pincus Large         Location:         [facility]                    MD ---------------------------------------------------------------------- Orders   #  Description                                 Code   1  [place] MFM OB LIMITED                           [phone]  ----------------------------------------------------------------------   #  Ordered By               Order #        Accession #    Episode #   1  Caryl Ada             [id number]      [id number]     [id number]  ---------------------------------------------------------------------- Indications   [redacted] weeks gestation of pregnancy                Z3A.15   Vaginal bleeding in pregnancy, second          O46.92   trimester   Abdominal pain in pregnancy                    O99.89   Twin pregnancy, di/di, second trimester        O30.042  ---------------------------------------------------------------------- OB History  Gravidity:    3         Term:   2  Living:       2  ---------------------------------------------------------------------- Fetal Evaluation (Fetus A)  Num Of Fetuses:     2  Fetal Heart         161  Rate(bpm):  Cardiac Activity:   Observed  Fetal Lie:          Maternal right side  Presentation:       Breech  Placenta:           Posterior, low-lying  Membrane  Desc:      Dividing Membrane seen - Dichorionic.  Amniotic Fluid  AFI FV:      Subjectively within normal limits                              Largest Pocket(cm)                              4.6 ---------------------------------------------------------------------- Gestational Age (Fetus A)  LMP:           17w 2d        Date:  01/27/17                 EDD:   11/03/17  Best:          15w 5d     Det. ByMarcella Dubs         EDD:   11/14/17                                      (04/12/17) ---------------------------------------------------------------------- Fetal Evaluation (Fetus B)  Num Of Fetuses:     2  Fetal Heart         167  Rate(bpm):  Cardiac Activity:   Observed  Fetal Lie:          Maternal left side  Presentation:       Breech  Placenta:           Posterior  Membrane Desc:      Dividing Membrane seen - Dichorionic.  Amniotic Fluid  AFI FV:      Subjectively within normal limits                              Largest Pocket(cm)                              2.6 ---------------------------------------------------------------------- Gestational Age (Fetus B)  LMP:           17w 2d        Date:  01/27/17                 EDD:   11/03/17  Best:          15w 5d     Det. ByMarcella Dubs         EDD:   11/14/17                                      (04/12/17) ---------------------------------------------------------------------- Cervix Uterus Adnexa  Cervix  Length:           4.26  cm.  Normal appearance by transabdominal scan.  Uterus  No abnormality visualized.  Left Ovary  Not visualized.  Right Ovary  Not visualized.  Adnexa:       No abnormality visualized. No adnexal mass                visualized.  ---------------------------------------------------------------------- Impression  Dichorionic/diamniotic twin pregnancy at 15+5 weeks  Normal amniotic fluid volume x 2  Twin A: low lying posterior placenta; no subchorionic fluid  collections/hemorrhage identified ---------------------------------------------------------------------- Recommendations  Follow-up ultrasound in  3 weeks for detailed anatomic surveys ----------------------------------------------------------------------                 Particia NearingMartha Decker, MD Electronically Signed Final Report   05/29/2017 08:32 am ----------------------------------------------------------------------   MAU COURSE Vitals and nursing notes reviewed I have ordered labs/imaging and reviewed them Rh neg -> with vaginal bleeding in second trimester Rhogam given US showing twin pregnancy with low-lying placentas +UDS for opiates and THC Wet prep negative; cultures pending Treatments given in MAU: RHOGAM  MDM Plan of care reviewed with patient, including labs and tests ordered and medical treatment.   ASSESSMENT 1. Twin gestation in second trimester, unspecified multiple gestation type   2. Vaginal bleeding   3. Abdominal pain   4. [redacted] weeks gestation of pregnancy   5. Vaginal bleeding in pregnancy   6. Rh negative state in antepartum period   7. Abdominal pain affecting pregnancy   8. Postcoital bleeding     PLAN Discharge home in stable condition Pelvic rest encouraged Message sent to clinic to schedule initial prenatal visit for high risk patient Encouraged discontinuation of substance us Counseled on return precautions Handout given   Caryl AdaJazma Monice Lundy, DO OB Fellow Faculty Practice, [facility] 05/28/2017, 6:26 PM

## 2017-05-29 LAB — RH IG WORKUP (INCLUDES ABO/RH)
ABO/RH(D): B NEG
Antibody Screen: NEGATIVE
GESTATIONAL AGE(WKS): 16
Unit division: 0

## 2017-05-29 LAB — GC/CHLAMYDIA PROBE AMP (~~LOC~~) NOT AT ARMC
Chlamydia: NEGATIVE
Neisseria Gonorrhea: NEGATIVE

## 2017-07-03 ENCOUNTER — Inpatient Hospital Stay (HOSPITAL_COMMUNITY)
Admission: AD | Admit: 2017-07-03 | Discharge: 2017-07-03 | Disposition: A | Discharge status: Home / Self Care | Payer: Medicaid Other | Source: Ambulatory Visit | Attending: Family Medicine | Admitting: Family Medicine

## 2017-07-03 ENCOUNTER — Encounter (HOSPITAL_COMMUNITY): Payer: Self-pay

## 2017-07-03 ENCOUNTER — Inpatient Hospital Stay (HOSPITAL_COMMUNITY): Payer: Medicaid Other

## 2017-07-03 DIAGNOSIS — O26892 Other specified pregnancy related conditions, second trimester: Secondary | ICD-10-CM | POA: Diagnosis not present

## 2017-07-03 DIAGNOSIS — O99891 Other specified diseases and conditions complicating pregnancy: Secondary | ICD-10-CM

## 2017-07-03 DIAGNOSIS — O9989 Other specified diseases and conditions complicating pregnancy, childbirth and the puerperium: Secondary | ICD-10-CM

## 2017-07-03 DIAGNOSIS — Z3A20 20 weeks gestation of pregnancy: Secondary | ICD-10-CM | POA: Insufficient documentation

## 2017-07-03 DIAGNOSIS — Z87891 Personal history of nicotine dependence: Secondary | ICD-10-CM | POA: Diagnosis not present

## 2017-07-03 DIAGNOSIS — M549 Dorsalgia, unspecified: Secondary | ICD-10-CM

## 2017-07-03 LAB — URINALYSIS, ROUTINE W REFLEX MICROSCOPIC
BILIRUBIN URINE: NEGATIVE
GLUCOSE, UA: 50 mg/dL — AB
KETONES UR: NEGATIVE mg/dL
NITRITE: NEGATIVE
PH: 6 (ref 5.0–8.0)
Protein, ur: 30 mg/dL — AB
SPECIFIC GRAVITY, URINE: 1.029 (ref 1.005–1.030)

## 2017-07-03 MED ORDER — KETOROLAC TROMETHAMINE 60 MG/2ML IM SOLN
60.0000 mg | INTRAMUSCULAR | Status: AC
  Administered 2017-07-03: 60 mg via INTRAMUSCULAR
  Filled 2017-07-03: qty 2

## 2017-07-03 MED ORDER — HYDROMORPHONE HCL 1 MG/ML IJ SOLN
1.0000 mg | Freq: Once | INTRAMUSCULAR | Status: AC
  Administered 2017-07-03: 1 mg via INTRAMUSCULAR
  Filled 2017-07-03: qty 1

## 2017-07-03 NOTE — MAU Note (Signed)
States her kidneys have been hurting since last night- comes in wave. Arm and leg puffiness since this AM. No vaginal bleeding, no ctx, no LOF.

## 2017-07-03 NOTE — MAU Provider Note (Signed)
History     CSN: [id number]  Arrival date and time: 07/03/17 1303   First Provider Initiated Contact with Patient 07/03/17 1512      Chief Complaint  Patient presents with  . Back Pain  . Arm Swelling  . Leg Swelling   HPI  Cheryl Holt is a 26 y.o. G3P2001 at 5251w6d gestation presenting to MAU with complaints of back pain that woke her up at 0230 this AM and a sudden onset of "puffy arms and legs". She is unable to get comfortable d/t pain. She states "I know it is my kidney hurting." She denies ctxs, VB or LOF.  Past Medical History:  Diagnosis Date  . Depression   . Gestational diabetes   . Headache   . Hepatitis C 05/11/2016   [ ]  f/u quant [ ]  cmp  . Infection    UTI    Past Surgical History:  Procedure Laterality Date  . CHOLECYSTECTOMY, LAPAROSCOPIC  2014    Family History  Problem Relation Age of Onset  . Cancer Mother        pancreatic  . Migraines Mother   . Mental illness Father   . Epilepsy Father   . Migraines Father   . Hypertension Father     Social History   Tobacco Use  . Smoking status: Former Smoker    Packs/day: 0.50    Years: 10.00    Pack years: 5.00    Types: Cigarettes  . Smokeless tobacco: Never Used  Substance Use Topics  . Alcohol use: No  . Drug use: No    Comment: not since pregnancy    Allergies: No Known Allergies  Medications Prior to Admission  Medication Sig Dispense Refill Last Dose  . acetaminophen (TYLENOL) 325 MG tablet Take 650 mg by mouth every 6 (six) hours as needed for mild pain or headache.   07/03/2017 at Unknown time  . calcium carbonate (TUMS - DOSED IN MG ELEMENTAL CALCIUM) 500 MG chewable tablet Chew 1 tablet by mouth 2 (two) times daily as needed for indigestion or heartburn.   07/02/2017 at Unknown time  . Prenatal Vit-Fe Fumarate-FA (PRENATAL MULTIVITAMIN) TABS tablet Take 1 tablet by mouth daily at 12 noon. 30 tablet 0 07/03/2017 at Unknown time  . Cetirizine HCl 10 MG CAPS Take 1 capsule  (10 mg total) by mouth daily. 30 capsule 0 Unknown at Unknown time  . fluticasone (FLONASE) 50 MCG/ACT nasal spray Place 2 sprays into both nostrils daily. 16 g 2 Unknown at Unknown time  . permethrin (ELIMITE) 5 % cream Apply to affected area once, wash off after 8 hours 60 g 0 Unknown at Unknown time    Review of Systems  Constitutional: Negative.   HENT: Negative.   Eyes: Negative.   Respiratory: Negative.   Cardiovascular: Positive for leg swelling.  Gastrointestinal: Negative.   Endocrine: Negative.   Genitourinary: Negative.   Musculoskeletal: Positive for back pain.  Skin: Negative.   Allergic/Immunologic: Negative.   Neurological: Negative.   Hematological: Negative.   Psychiatric/Behavioral: Negative.    Physical Exam   Blood pressure 122/66, pulse (!) 112, temperature 97.8 F (36.6 C), temperature source Oral, resp. rate 18, last menstrual period 04/22/2017.  Physical Exam  Constitutional: She is oriented to person, place, and time. She appears well-developed and well-nourished.  HENT:  Head: Normocephalic.  Eyes: Pupils are equal, round, and reactive to light.  Neck: Normal range of motion.  Cardiovascular: Normal rate, regular rhythm and normal heart sounds.  Respiratory: Effort normal and breath sounds normal.  GI: Soft. Bowel sounds are normal.  Genitourinary:  Genitourinary Comments: FHTs by doppler: twin A 149 bpm; twin B 160 bpm   Musculoskeletal: Normal range of motion.  Neurological: She is alert and oriented to person, place, and time.  Skin: Skin is warm and dry.  Psychiatric: She has a normal mood and affect. Her behavior is normal. Judgment and thought content normal.    MAU Course  Procedures  MDM CCUA UCx -- pending Dilaudid 2 mg IM -- improved pain Toradol 60 mg IM -- improved pain Renal U/S  [place] Renal  Result Date: 07/03/2017 CLINICAL DATA:  Low back pain for several hours, [redacted] weeks pregnant EXAM: RENAL / URINARY TRACT ULTRASOUND  COMPLETE COMPARISON:  08/06/2016 FINDINGS: Right Kidney: Length: 11.6 cm. Echogenicity within normal limits. No mass or hydronephrosis visualized. Left Kidney: Length: 12.4 cm. Echogenicity within normal limits. No mass or hydronephrosis visualized. Bladder: Not well visualized IMPRESSION: Normal appearing kidneys without evidence of hydronephrosis. Previously seen renal stones are not well appreciated on this exam. Electronically Signed   By: Alcide CleverMark  Lukens M.D.   On: 07/03/2017 16:40    Assessment and Plan  Back pain affecting pregnancy in second trimester  - Advised to call to schedule OB appt ASAP - Ok to take Tylenol 1000 mg every 6 hrs prn pain  Discharge home Patient verbalized an understanding of the plan of care and agrees.    Raelyn Moraolitta Braydin Aloi, MSN, CNM 07/03/2017, 3:43 PM

## 2017-07-04 LAB — CULTURE, OB URINE: SPECIAL REQUESTS: NORMAL

## 2017-07-22 ENCOUNTER — Encounter: Payer: Self-pay | Admitting: Obstetrics and Gynecology

## 2017-07-24 DIAGNOSIS — Z87891 Personal history of nicotine dependence: Secondary | ICD-10-CM | POA: Diagnosis not present

## 2017-07-24 DIAGNOSIS — Z3A00 Weeks of gestation of pregnancy not specified: Secondary | ICD-10-CM | POA: Insufficient documentation

## 2017-07-24 DIAGNOSIS — O30002 Twin pregnancy, unspecified number of placenta and unspecified number of amniotic sacs, second trimester: Secondary | ICD-10-CM | POA: Insufficient documentation

## 2017-07-24 DIAGNOSIS — N611 Abscess of the breast and nipple: Secondary | ICD-10-CM | POA: Insufficient documentation

## 2017-07-24 NOTE — ED Triage Notes (Signed)
Pt comes from home via EMS with complaints of an abscess above her left breast.  Pt attempted to lance it herself and now has bruising. [redacted] weeks pregnant. Pt ambulatory off of truck.  Vitals HR 120, BP 114/58.

## 2017-07-25 ENCOUNTER — Emergency Department (HOSPITAL_COMMUNITY)
Admission: EM | Admit: 2017-07-25 | Discharge: 2017-07-25 | Disposition: A | Discharge status: Home / Self Care | Payer: Medicaid Other | Attending: Emergency Medicine | Admitting: Emergency Medicine

## 2017-07-25 ENCOUNTER — Emergency Department (HOSPITAL_COMMUNITY): Payer: Medicaid Other

## 2017-07-25 ENCOUNTER — Encounter (HOSPITAL_COMMUNITY): Payer: Self-pay | Admitting: Family Medicine

## 2017-07-25 ENCOUNTER — Inpatient Hospital Stay: Admission: RE | Admit: 2017-07-25 | Payer: Self-pay | Source: Ambulatory Visit

## 2017-07-25 DIAGNOSIS — N611 Abscess of the breast and nipple: Secondary | ICD-10-CM

## 2017-07-25 DIAGNOSIS — L0291 Cutaneous abscess, unspecified: Secondary | ICD-10-CM

## 2017-07-25 DIAGNOSIS — O30002 Twin pregnancy, unspecified number of placenta and unspecified number of amniotic sacs, second trimester: Secondary | ICD-10-CM

## 2017-07-25 LAB — URINALYSIS, ROUTINE W REFLEX MICROSCOPIC
BILIRUBIN URINE: NEGATIVE
Glucose, UA: NEGATIVE mg/dL
HGB URINE DIPSTICK: NEGATIVE
Ketones, ur: 80 mg/dL — AB
NITRITE: NEGATIVE
PH: 7 (ref 5.0–8.0)
Protein, ur: 30 mg/dL — AB
RBC / HPF: NONE SEEN RBC/hpf (ref 0–5)
SPECIFIC GRAVITY, URINE: 1.017 (ref 1.005–1.030)
WBC, UA: NONE SEEN WBC/hpf (ref 0–5)

## 2017-07-25 LAB — COMPREHENSIVE METABOLIC PANEL
ALBUMIN: 3.2 g/dL — AB (ref 3.5–5.0)
ALK PHOS: 88 U/L (ref 38–126)
ALT: 77 U/L — AB (ref 14–54)
ANION GAP: 10 (ref 5–15)
AST: 39 U/L (ref 15–41)
BILIRUBIN TOTAL: 1 mg/dL (ref 0.3–1.2)
CALCIUM: 9 mg/dL (ref 8.9–10.3)
CO2: 21 mmol/L — AB (ref 22–32)
CREATININE: 0.52 mg/dL (ref 0.44–1.00)
Chloride: 107 mmol/L (ref 101–111)
GFR calc Af Amer: >60 mL/min (ref >60)
GFR calc non Af Amer: >60 mL/min (ref >60)
GLUCOSE: 80 mg/dL (ref 65–99)
Potassium: 3.5 mmol/L (ref 3.5–5.1)
SODIUM: 138 mmol/L (ref 135–145)
TOTAL PROTEIN: 7.1 g/dL (ref 6.5–8.1)

## 2017-07-25 LAB — CBC WITH DIFFERENTIAL/PLATELET
BASOS ABS: 0 10*3/uL (ref 0.0–0.1)
BASOS PCT: 0 %
EOS ABS: 0.1 10*3/uL (ref 0.0–0.7)
EOS PCT: 1 %
HEMATOCRIT: 34.3 % — AB (ref 36.0–46.0)
Hemoglobin: 11.8 g/dL — ABNORMAL LOW (ref 12.0–15.0)
Lymphocytes Relative: 13 %
Lymphs Abs: 1.7 10*3/uL (ref 0.7–4.0)
MCH: 31.3 pg (ref 26.0–34.0)
MCHC: 34.4 g/dL (ref 30.0–36.0)
MCV: 91 fL (ref 78.0–100.0)
MONO ABS: 1 10*3/uL (ref 0.1–1.0)
MONOS PCT: 8 %
NEUTROS ABS: 10.3 10*3/uL — AB (ref 1.7–7.7)
Neutrophils Relative %: 78 %
PLATELETS: 332 10*3/uL (ref 150–400)
RBC: 3.77 MIL/uL — ABNORMAL LOW (ref 3.87–5.11)
RDW: 13.9 % (ref 11.5–15.5)
WBC: 13.2 10*3/uL — ABNORMAL HIGH (ref 4.0–10.5)

## 2017-07-25 LAB — RAPID URINE DRUG SCREEN, HOSP PERFORMED
AMPHETAMINES: NOT DETECTED
Barbiturates: NOT DETECTED
Benzodiazepines: NOT DETECTED
Cocaine: POSITIVE — AB
OPIATES: POSITIVE — AB
TETRAHYDROCANNABINOL: POSITIVE — AB

## 2017-07-25 LAB — I-STAT CG4 LACTIC ACID, ED: Lactic Acid, Venous: 0.77 mmol/L (ref 0.5–1.9)

## 2017-07-25 MED ORDER — CEFAZOLIN SODIUM-DEXTROSE 2-4 GM/100ML-% IV SOLN
2.0000 g | Freq: Once | INTRAVENOUS | Status: AC
  Administered 2017-07-25: 2 g via INTRAVENOUS
  Filled 2017-07-25: qty 100

## 2017-07-25 MED ORDER — ACETAMINOPHEN 500 MG PO TABS
1000.0000 mg | ORAL_TABLET | Freq: Once | ORAL | Status: AC
  Administered 2017-07-25: 1000 mg via ORAL
  Filled 2017-07-25: qty 2

## 2017-07-25 MED ORDER — VANCOMYCIN HCL IN DEXTROSE 1-5 GM/200ML-% IV SOLN
1000.0000 mg | Freq: Once | INTRAVENOUS | Status: AC
  Administered 2017-07-25: 1000 mg via INTRAVENOUS
  Filled 2017-07-25: qty 200

## 2017-07-25 MED ORDER — CLINDAMYCIN HCL 150 MG PO CAPS: 450.0000 mg | ORAL_CAPSULE | Freq: Three times a day (TID) | ORAL | 0 refills | Status: DC

## 2017-07-25 MED ORDER — SODIUM CHLORIDE 0.9 % IV BOLUS (SEPSIS)
2000.0000 mL | Freq: Once | INTRAVENOUS | Status: AC
  Administered 2017-07-25: 2000 mL via INTRAVENOUS

## 2017-07-25 MED ORDER — PROMETHAZINE HCL 25 MG/ML IJ SOLN
12.5000 mg | Freq: Once | INTRAMUSCULAR | Status: AC
  Administered 2017-07-25: 12.5 mg via INTRAVENOUS
  Filled 2017-07-25: qty 1

## 2017-07-25 NOTE — ED Notes (Signed)
Rapid OB RN at bedside to assess fetal heart tones.

## 2017-07-25 NOTE — ED Provider Notes (Signed)
8:53 AM Patient signed out to me at shift change.  Patient is a 27 year old female, currently 24 weeks 0 days pregnant with twins.  She presented to emergency department complaining of left breast pain.  Exam is consistent with left breast abscess and cellulitis.  She tried to drain it herself with a knife unsuccessfully at home.  Official ultrasound was ordered, which showed a 2.4 x 2.3 x 2.2 cm complex fluid collection.  Earlier provider [facility], who recommended surgical consultation here.  I spoke with Dr. Gerrit FriendsGerkin with general surgery who will come by and see patient.  9:54 AM Patient was seen by Dr.Gerkin.  At this time her heart rate improved, and he does not think that patient needs admission for IV antibiotics.  He also explained that the treatment choice for an abscess of the size is aspiration which is usually done by interventional radiology or breast center.  He recommended contacting interventional radiology here.  I spoke with Dr. Fredia SorrowYamagata, with interventional radiology, who will contact breast center to see if the abscess can be drained today.   10:05 AM Spoke with Dr. Earlene Plateravis [facility] at [facility] who informed me that if pt stable for dc home, she would be able to be seen today at the breast center for aspiration of the abscess.  I reassessed patient.  Patient is no longer tachycardic, heart rate is in the 90s.  I have noticed that rectal temperature was still not performed, I asked the nurse to perform rectal temperature.  Her blood pressure has been normal.  She is nontoxic appearing.  She is ambulatory in department, no acute distress.  She does not appear septic at this time.  I discussed with her the plan of going to breast center today and starting on antibiotics.  10:30 AM Spoke with breast center, apt made for 11:30. Pt was given two bus passes to get to the breast center and back. Pt instructed to go directly there straight from  ED. Discussed importance of being seen there today for treatment of this abscess. She was given prescription for clindamycin.  At this time, she stable for discharge home, with normal vital signs, with a plan of aspiration of the abscess and follow-up with the breast center as needed.  Return precautions discussed.        Jaynie CrumbleKirichenko, Orvil Faraone, PA-C 07/25/17 1211    Shaune PollackIsaacs, [place], MD 07/25/17 1940

## 2017-07-25 NOTE — ED Triage Notes (Addendum)
Patient was sitting calm, cooperative with no vomiting while waiting to be seen. While triaging, she started vomiting with induced cough.

## 2017-07-25 NOTE — Consult Note (Signed)
General Surgery [facility]  Reason for Consult: left breast abscess  Referring Physician: Freddie Apley, PA, in [place] is an 27 y.o. female.  HPI: patient is a 27 yo WF who is [redacted] weeks pregnant with twin gestation.  Three day hx of pain and swelling and redness in upper left breast.  No prior episodes.  Complains of pain.  Two days ago attempted to incise area herself at home with instrument.  Now presents to ER for evaluation.  Afebrile.  WBC 13K.  USN shows left breast abscess measuring 2.4 cm in dimension.  Radiologist recommends referral to breast center for aspiration and oral abx therapy.  Past Medical History:  Diagnosis Date  . Depression   . Gestational diabetes   . Headache   . Hepatitis C 05/11/2016   [ ]  f/u quant [ ]  cmp  . Infection    UTI    Past Surgical History:  Procedure Laterality Date  . CHOLECYSTECTOMY, LAPAROSCOPIC  2014    Family History  Problem Relation Age of Onset  . Cancer Mother        pancreatic  . Migraines Mother   . Mental illness Father   . Epilepsy Father   . Migraines Father   . Hypertension Father     Social History:  reports that she has quit smoking. Her smoking use included cigarettes. She has a 5.00 pack-year smoking history. she has never used smokeless tobacco. She reports that she does not drink alcohol or use drugs.  Allergies: No Known Allergies  Medications: I have reviewed the patient's current medications.  Results for orders placed or performed during the hospital encounter of 07/25/17 (from the past 48 hour(s))  Comprehensive metabolic panel     Status: Abnormal   Collection Time: 07/25/17  4:42 AM  Result Value Ref Range   Sodium 138 135 - 145 mmol/L   Potassium 3.5 3.5 - 5.1 mmol/L   Chloride 107 101 - 111 mmol/L   CO2 21 (L) 22 - 32 mmol/L   Glucose, Bld 80 65 - 99 mg/dL   BUN <5 (L) 6 - 20 mg/dL   Creatinine, Ser 0.52 0.44 - 1.00 mg/dL   Calcium 9.0 8.9 -  10.3 mg/dL   Total Protein 7.1 6.5 - 8.1 g/dL   Albumin 3.2 (L) 3.5 - 5.0 g/dL   AST 39 15 - 41 U/L   ALT 77 (H) 14 - 54 U/L   Alkaline Phosphatase 88 38 - 126 U/L   Total Bilirubin 1.0 0.3 - 1.2 mg/dL   GFR calc non Af Amer >60 >60 mL/min   GFR calc Af Amer >60 >60 mL/min    Comment: (NOTE) The eGFR has been calculated using the CKD EPI equation. This calculation has not been validated in all clinical situations. eGFR's persistently <60 mL/min signify possible Chronic Kidney Disease.    Anion gap 10 5 - 15  CBC WITH DIFFERENTIAL     Status: Abnormal   Collection Time: 07/25/17  4:42 AM  Result Value Ref Range   WBC 13.2 (H) 4.0 - 10.5 K/uL   RBC 3.77 (L) 3.87 - 5.11 MIL/uL   Hemoglobin 11.8 (L) 12.0 - 15.0 g/dL   HCT 34.3 (L) 36.0 - 46.0 %   MCV 91.0 78.0 - 100.0 fL   MCH 31.3 26.0 - 34.0 pg   MCHC 34.4 30.0 - 36.0 g/dL   RDW 13.9 11.5 - 15.5 %   Platelets 332  150 - 400 K/uL   Neutrophils Relative % 78 %   Neutro Abs 10.3 (H) 1.7 - 7.7 K/uL   Lymphocytes Relative 13 %   Lymphs Abs 1.7 0.7 - 4.0 K/uL   Monocytes Relative 8 %   Monocytes Absolute 1.0 0.1 - 1.0 K/uL   Eosinophils Relative 1 %   Eosinophils Absolute 0.1 0.0 - 0.7 K/uL   Basophils Relative 0 %   Basophils Absolute 0.0 0.0 - 0.1 K/uL  I-Stat CG4 Lactic Acid, ED  (not at  [facility])     Status: None   Collection Time: 07/25/17  4:56 AM  Result Value Ref Range   Lactic Acid, Venous 0.77 0.5 - 1.9 mmol/L  Urinalysis, Routine w reflex microscopic     Status: Abnormal   Collection Time: 07/25/17  5:40 AM  Result Value Ref Range   Color, Urine AMBER (A) YELLOW    Comment: BIOCHEMICALS MAY BE AFFECTED BY COLOR   APPearance CLOUDY (A) CLEAR   Specific Gravity, Urine 1.017 1.005 - 1.030   pH 7.0 5.0 - 8.0   Glucose, UA NEGATIVE NEGATIVE mg/dL   Hgb urine dipstick NEGATIVE NEGATIVE   Bilirubin Urine NEGATIVE NEGATIVE   Ketones, ur 80 (A) NEGATIVE mg/dL   Protein, ur 30 (A) NEGATIVE mg/dL   Nitrite NEGATIVE NEGATIVE    Leukocytes, UA TRACE (A) NEGATIVE   RBC / HPF NONE SEEN 0 - 5 RBC/hpf   WBC, UA NONE SEEN 0 - 5 WBC/hpf   Bacteria, UA RARE (A) NONE SEEN   Squamous Epithelial / LPF 6-30 (A) NONE SEEN   Mucus PRESENT   Rapid urine drug screen (hospital performed)     Status: Abnormal   Collection Time: 07/25/17  5:40 AM  Result Value Ref Range   Opiates POSITIVE (A) NONE DETECTED   Cocaine POSITIVE (A) NONE DETECTED   Benzodiazepines NONE DETECTED NONE DETECTED   Amphetamines NONE DETECTED NONE DETECTED   Tetrahydrocannabinol POSITIVE (A) NONE DETECTED   Barbiturates NONE DETECTED NONE DETECTED    Comment: (NOTE) DRUG SCREEN FOR MEDICAL PURPOSES ONLY.  IF CONFIRMATION IS NEEDED FOR ANY PURPOSE, NOTIFY LAB WITHIN 5 DAYS. LOWEST DETECTABLE LIMITS FOR URINE DRUG SCREEN Drug Class                     Cutoff (ng/mL) Amphetamine and metabolites    1000 Barbiturate and metabolites    200 Benzodiazepine                 956 Tricyclics and metabolites     300 Opiates and metabolites        300 Cocaine and metabolites        300 THC                            50     US Breast Ltd Uni Left Inc Axilla  Result Date: 07/25/2017 CLINICAL DATA:  27 year old with three-day history of edema and erythema involving the upper left breast. EXAM: ULTRASOUND OF THE LEFT BREAST COMPARISON:  None. FINDINGS: Corresponding to the area of clinical concern at the 12 o'clock position is an approximate 2.4 x 2.3 x 2.2 cm complex fluid collection with abundant internal debris, demonstrating acoustic enhancement and demonstrating mild hyperemia on color Doppler evaluation. The adjacent breast tissues demonstrate mild interstitial edema. IMPRESSION: Approximate 2.4 cm abscess involving the upper left breast at the 12 o'clock position. RECOMMENDATION: Referral to the [facility] [place] for aspiration of the abscess and initiation of antibiotic therapy. BI-RADS CATEGORY  2: Benign. Electronically Signed   By: Evangeline Dakin M.D.   On: 07/25/2017 08:27    Review of Systems  Constitutional: Negative.   HENT: Negative.   Eyes: Negative.   Respiratory: Negative.   Cardiovascular: Negative.   Gastrointestinal: Negative.   Genitourinary: Negative.   Musculoskeletal: Negative.   Skin:       Redness upper left breast and chest wall for 3 days  Neurological: Negative.   Endo/Heme/Allergies: Negative.   Psychiatric/Behavioral: Negative.    Blood pressure 112/72, pulse (!) 107, temperature 98 F (36.7 C), temperature source Oral, resp. rate 17, height 5' 5"  (1.651 m), weight 107 kg (236 lb), last menstrual period 04/22/2017, SpO2 96 %, unknown if currently breastfeeding. Physical Exam  Constitutional: She is oriented to person, place, and time. She appears well-developed and well-nourished. No distress.  HENT:  Head: Normocephalic and atraumatic.  Right Ear: External ear normal.  Left Ear: External ear normal.  Mouth/Throat: No oropharyngeal exudate.  Eyes: Conjunctivae are normal. Pupils are equal, round, and reactive to light. Right eye exhibits no discharge. Left eye exhibits no discharge. No scleral icterus.  Neck: Normal range of motion. Neck supple. No tracheal deviation present. No thyromegaly present.  Cardiovascular: Regular rhythm and normal heart sounds.  No murmur heard. Mild tachycardia  Respiratory: Effort normal and breath sounds normal. No respiratory distress. She has no wheezes.  Erythema (approx 5-6 cm diameter) upper left breast.  <1cm "incision" with dry eschar.  Firm induration with tenderness approx 5-6 cm in diameter in SQ and breast.  No drainage. No fluctuence.  Musculoskeletal: Normal range of motion. She exhibits no edema or deformity.  Neurological: She is alert and oriented to person, place, and time.  Skin: Skin is warm and dry. She is not diaphoretic. There is erythema (upper left breast area).  Psychiatric: She has a normal mood and affect. Her behavior is normal.     Assessment/Plan:  Left breast abscess in pregnant female with polysubstance abuse ongoing  Discussed with Freddie Apley, PA, in ER office  Agree with radiologist recommendation for percutaneous aspiration and oral abx therapy per Breast Center protocol  Symptomatic treatment with pain Rx, warm showers, heating pad, anti-inflammatories  May follow up at [place] office if fails aspirations and oral abx therapy  No current need for in-patient admission to surgical service  Please call if needed  Armandina Gemma, [place] [facility] Office: [place] 07/25/2017, 9:31 AM

## 2017-07-25 NOTE — ED Provider Notes (Signed)
Patient seen/examined in the Emergency Department in conjunction with Midlevel Provider  Patient reports abscess to left breast, she has tried opening the wound with a knife Exam : Awake alert, no distress, no crepitus to chest wall, abscess cellulitis noted left breast, nurse present for exam Plan: We will likely need to be admitted to the fact she has worsening cellulitis of left breast in the setting of greater than 20 weeks twin pregnancy PA Muthersbaugh will call pharmacy for guidance on antibiotic   Cheryl Holt, Cheryl Kittleson, MD 07/25/17 [phone]

## 2017-07-25 NOTE — ED Notes (Signed)
All of a sudden, she stopped vomiting and wanted to go seat in the lobby till she had a room. She was provided a warm blanket earlier. Patient appears in no acute distress and able to ambulate with a steady gait.

## 2017-07-25 NOTE — Discharge Instructions (Addendum)
Please go directly [facility]. Your apt is at 11:30 but they would like for you to be there as soon as possible. Return if any problems.

## 2017-07-25 NOTE — ED Provider Notes (Signed)
[facility]-EMERGENCY DEPT Provider Note   CSN: [id number] Arrival date & time: 07/24/17  2336     History   Chief Complaint Chief Complaint  Patient presents with  . Abscess    HPI Cheryl Holt is a 27 y.o. female with a hx of depression, gestational diabetes presents to the Emergency Department complaining of gradual, persistent, progressively worsening left breast abscess onset 2 days ago.  Patient reports she attempted to use a pocket knife to open it and the symptoms have only worsened since that time.  She was unable to express any purulent drainage with the knife.  Patient reports that the redness and swelling have now extended down into her breast along with associated bruising and up into her chest wall.  In addition she reports persistent dry cough and nasal congestion.  She denies shortness of breath.  She states she has had some abdominal pain but no vaginal bleeding.  She reports vigorous movement of her twins.  She denies vaginal discharge or vaginal bleeding.  She has not been evaluated for this.  She denies chest pain, shortness of breath.  She does report posttussive emesis all day today.  She reports she has been unable to keep anything down because of this.  No known sick contacts.  Denies fever, chills, weakness, syncope, dysuria.    OB: [facility].     The history is provided by the patient and medical records. No language interpreter was used.    Past Medical History:  Diagnosis Date  . Depression   . Gestational diabetes   . Headache   . Hepatitis C 05/11/2016   [ ]  f/u quant [ ]  cmp  . Infection    UTI    Patient Active Problem List   Diagnosis Date Noted  . Back pain affecting pregnancy in second trimester 07/03/2017  . Supervision of high-risk pregnancy 05/24/2016  . Hepatitis C 05/11/2016  . Obesity in pregnancy 05/10/2016  . Gestational diabetes 04/24/2016  . Rh negative status during pregnancy 04/04/2016  .  GBS bacteriuria 04/04/2016  . History of gestational diabetes mellitus 03/27/2016  . Substance abuse affecting pregnancy in second trimester, antepartum 02/26/2016  . Anxiety and depression 02/26/2016  . Cannabis use disorder, moderate, dependence (HCC) 01/18/2016  . 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 Tobacco use disorder 01/18/2016  . Opioid use disorder, severe, in sustained remission (HCC) 01/18/2016  . 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 CHOLECYSTECTOMY, LAPAROSCOPIC  2014    OB History    Gravida Para Term Preterm AB Living   3 2 2     1    SAB TAB Ectopic Multiple Live Births           1       Home Medications    Prior to Admission medications   Medication Sig Start Date End Date Taking? Authorizing Provider  acetaminophen (TYLENOL) 325 MG tablet Take 650 mg by mouth every 6 (six) hours as needed for mild pain or headache.   Yes [provider]  calcium carbonate (TUMS - DOSED IN MG ELEMENTAL CALCIUM) 500 MG chewable tablet Chew 1 tablet by mouth 2 (two) times daily as needed for indigestion or heartburn.   Yes [provider]  Prenatal Vit-Fe Fumarate-FA (PRENATAL MULTIVITAMIN) TABS tablet Take 1 tablet by mouth daily at 12 noon. 05/20/17  Yes Lynelle Doctor,  Iva, MD  Cetirizine HCl 10 MG CAPS Take 1 capsule (10 mg total) by mouth daily. Patient not taking: Reported on 07/25/2017 07/05/16   Donette Larry, CNM  fluticasone [facility]) 50 MCG/ACT nasal spray Place 2 sprays into both nostrils daily. Patient not taking: Reported on 07/25/2017 07/09/16   Freddrick March, MD    Family History Family History  Problem Relation Age of Onset  . Cancer Mother        pancreatic  . Migraines Mother   . Mental illness Father   . Epilepsy Father   . Migraines Father   . Hypertension Father     Social History Social  History   Tobacco Use  . Smoking status: Former Smoker    Packs/day: 0.50    Years: 10.00    Pack years: 5.00    Types: Cigarettes  . Smokeless tobacco: Never Used  Substance Use Topics  . Alcohol use: No  . Drug use: No    Comment: not since pregnancy     Allergies   Patient has no known allergies.   Review of Systems Review of Systems  Constitutional: Negative for appetite change, diaphoresis, fatigue, fever and unexpected weight change.  HENT: Positive for postnasal drip, rhinorrhea and sore throat. Negative for mouth sores.   Eyes: Negative for visual disturbance.  Respiratory: Positive for cough. Negative for chest tightness, shortness of breath and wheezing.   Cardiovascular: Negative for chest pain.  Gastrointestinal: Positive for abdominal pain ( very mild cramping). Negative for constipation, diarrhea, nausea and vomiting.  Endocrine: Negative for polydipsia, polyphagia and polyuria.  Genitourinary: Negative for dysuria, frequency, hematuria, urgency, vaginal bleeding and vaginal pain.  Musculoskeletal: Negative for back pain and neck stiffness.  Skin: Negative for rash.  Allergic/Immunologic: Negative for immunocompromised state.  Neurological: Negative for syncope, light-headedness and headaches.  Hematological: Does not bruise/bleed easily.  Psychiatric/Behavioral: Negative for sleep disturbance. The patient is not nervous/anxious.      Physical Exam Updated Vital Signs BP 116/63 (BP Location: Right Arm)   Pulse (!) 103   Temp 98.3 F (36.8 C) (Oral)   Resp 18   Ht 5\' 5"  (1.651 m)   Wt 107 kg (236 lb)   LMP 04/22/2017 (Exact Date)   SpO2 97%   BMI 39.27 kg/m   Physical Exam  Constitutional: She appears well-developed and well-nourished. No distress.  Awake, alert, nontoxic appearance  HENT:  Head: Normocephalic and atraumatic.  Mouth/Throat: Oropharynx is clear and moist. No oropharyngeal exudate.  Eyes: Conjunctivae are normal. No scleral  icterus.  Neck: Normal range of motion. Neck supple.  Cardiovascular: Regular rhythm and intact distal pulses. Tachycardia present.  Pulmonary/Chest: Effort normal and breath sounds normal. Tachypnea noted. No respiratory distress. She has no wheezes.  Equal chest expansion Dry persistent cough    Abdominal: Soft. Bowel sounds are normal. She exhibits no mass. There is no tenderness. There is no rebound and no guarding.  Gravid uterus  Musculoskeletal: Normal range of motion. She exhibits no edema.  Neurological: She is alert.  Speech is clear and goal oriented Moves extremities without ataxia  Skin: Skin is warm and dry. She is not diaphoretic.  Sites concerning for track marks along the bilateral hands  Psychiatric: She has a normal mood and affect.  Nursing note and vitals reviewed.    ED Treatments / Results  Labs (all labs ordered are listed, but only abnormal results are displayed) Labs Reviewed  COMPREHENSIVE METABOLIC PANEL - Abnormal; Notable for the following components:  Result Value   CO2 21 (*)    BUN <5 (*)    Albumin 3.2 (*)    ALT 77 (*)    All other components within normal limits  CBC WITH DIFFERENTIAL/PLATELET - Abnormal; Notable for the following components:   WBC 13.2 (*)    RBC 3.77 (*)    Hemoglobin 11.8 (*)    HCT 34.3 (*)    Neutro Abs 10.3 (*)    All other components within normal limits  URINALYSIS, ROUTINE W REFLEX MICROSCOPIC - Abnormal; Notable for the following components:   Color, Urine AMBER (*)    APPearance CLOUDY (*)    Ketones, ur 80 (*)    Protein, ur 30 (*)    Leukocytes, UA TRACE (*)    Bacteria, UA RARE (*)    Squamous Epithelial / LPF 6-30 (*)    All other components within normal limits  RAPID URINE DRUG SCREEN, HOSP PERFORMED - Abnormal; Notable for the following components:   Opiates POSITIVE (*)    Cocaine POSITIVE (*)    Tetrahydrocannabinol POSITIVE (*)    All other components within normal limits  CULTURE, BLOOD  (ROUTINE X 2)  CULTURE, BLOOD (ROUTINE X 2)  I-STAT CG4 LACTIC ACID, ED    EKG  EKG Interpretation  Date/Time:  Thursday July 25 2017 04:36:31 EST Ventricular Rate:  115 PR Interval:    QRS Duration: 91 QT Interval:  319 QTC Calculation: 442 R Axis:   -21 Text Interpretation:  Sinus tachycardia LVH by voltage Baseline wander in lead(s) V1 V3 Confirmed by Zadie Rhine ([id number]) on 07/25/2017 4:41:57 AM       Procedures Procedures (including critical care time)  Medications Ordered in ED Medications  vancomycin (VANCOCIN) IVPB 1000 mg/200 mL premix (1,000 mg Intravenous New Bag/Given 07/25/17 2956)  sodium chloride 0.9 % bolus 2,000 mL (2,000 mLs Intravenous New Bag/Given 07/25/17 0456)  promethazine (PHENERGAN) injection 12.5 mg (12.5 mg Intravenous Given 07/25/17 0550)  ceFAZolin (ANCEF) IVPB 2g/100 mL premix (0 g Intravenous Stopped 07/25/17 0612)  acetaminophen (TYLENOL) tablet 1,000 mg (1,000 mg Oral Given 07/25/17 2130)     Initial Impression / Assessment and Plan / ED Course  I have reviewed the triage vital signs and the nursing notes.  Pertinent labs & imaging results that were available during my care of the patient were reviewed by me and considered in my medical decision making (see chart for details).  Clinical Course as of Jul 25 699  Thu Jul 25, 2017  8657 Appropriate FHT obtained for both babies by Rapid Response OB RN  [HM]  [phone] Discussed with Dr. Emelda Fear who recommends Breast US and general surgery consult.  They will evaluate mother and babies here in the ED today.    [HM]  [phone] Plan:  pt pending breast US and potential general surgery consult depending on [place] results.  OB will evaluate pt here in ED regardless.  Consult OB back after [place] is complete.    [HM]    Clinical Course User Index [HM] Adaley Kiene, Dahlia Client, PA-C    Patient presents the emergency department with large left breast abscess.  She denies drug usage however her drug screen is positive for  opiates, cocaine and marijuana.  She has sites on her hands that appear to be track marks however she reports these are from recent blood draws at multiple urgent cares.  Record review shows the patient has a long history of drug abuse.  Patient with leukocytosis and persistent tachycardia.  She is afebrile however she is eating ice and I suspect that her oral temperature is incorrect.  Will obtain rectal temperature.  A consult with OB.  They will evaluate here in the emergency department.  Patient is awaiting breast abscess and potential surgical consult for management.  She has been given antibiotics including vancomycin and Ancef after discussion with pharmacy.   [facility] shift change [facility] who will follow pending studies, re-evaulate and determine disposition.    Final Clinical Impressions(s) / ED Diagnoses   Final diagnoses:  Abscess  Breast abscess  Twin gestation in second trimester, unspecified multiple gestation type    ED Discharge Orders    None       Mardene SayerMuthersbaugh, Boyd KerbsHannah, PA-C 07/25/17 [id number]    Zadie RhineWickline, Donald, MD 07/25/17 [phone]

## 2017-07-25 NOTE — Progress Notes (Addendum)
Pt is a G3P2, @24wks  0da, twin gestation. Pt is here with an large abscess on her left breast, coughing and nausea. Requested to doppler only. Twin A (LLQ) 160bpm, Twin B (RLQ) 150bpm.   ED physician will contact Dr Sherryl BartersFergusen when lab results are available, for probable admission.

## 2017-07-29 NOTE — ED Provider Notes (Signed)
Took call from charge nurse with this patient on the phone stating that she needs another order to go to the breast center to have her abscess drained.  She states that she did not go as planned after discharge from the hospital because she had things to do.  Given the length of time since this patient was seen I have recommended that she come back for a recheck or go to urgent care as advised by the breast center.   Wynetta Emeryisciotta, Brok Stocking, Cordelia Poche-C 07/29/17 1213

## 2017-07-30 LAB — CULTURE, BLOOD (ROUTINE X 2)
CULTURE: NO GROWTH
Culture: NO GROWTH
Special Requests: ADEQUATE
Special Requests: ADEQUATE

## 2017-08-01 ENCOUNTER — Telehealth: Payer: Self-pay | Admitting: *Deleted

## 2017-08-01 NOTE — Telephone Encounter (Signed)
[facility] her but didn't and tried to send her to imaging. States she call [facility] and they said she needed an order.  Per chart review it appears Ms. Chisholm has had the US of her breast.  I called [facility] and left a message I am returning her call and if she still needs assistance to call our office again..Marland Kitchen

## 2017-09-02 ENCOUNTER — Encounter (HOSPITAL_COMMUNITY): Payer: Self-pay | Admitting: *Deleted

## 2017-09-02 ENCOUNTER — Inpatient Hospital Stay (HOSPITAL_COMMUNITY)
Admission: AD | Admit: 2017-09-02 | Discharge: 2017-09-02 | Disposition: A | Discharge status: Home / Self Care | Payer: Medicaid Other | Source: Ambulatory Visit | Attending: Obstetrics and Gynecology | Admitting: Obstetrics and Gynecology

## 2017-09-02 DIAGNOSIS — Z820 Family history of epilepsy and other diseases of the nervous system: Secondary | ICD-10-CM | POA: Diagnosis not present

## 2017-09-02 DIAGNOSIS — Z800 Family history of malignant neoplasm of digestive organs: Secondary | ICD-10-CM | POA: Diagnosis not present

## 2017-09-02 DIAGNOSIS — O4703 False labor before 37 completed weeks of gestation, third trimester: Secondary | ICD-10-CM | POA: Diagnosis not present

## 2017-09-02 DIAGNOSIS — Z818 Family history of other mental and behavioral disorders: Secondary | ICD-10-CM | POA: Diagnosis not present

## 2017-09-02 DIAGNOSIS — O30043 Twin pregnancy, dichorionic/diamniotic, third trimester: Secondary | ICD-10-CM | POA: Diagnosis not present

## 2017-09-02 DIAGNOSIS — Z8249 Family history of ischemic heart disease and other diseases of the circulatory system: Secondary | ICD-10-CM | POA: Diagnosis not present

## 2017-09-02 DIAGNOSIS — Z87891 Personal history of nicotine dependence: Secondary | ICD-10-CM | POA: Insufficient documentation

## 2017-09-02 DIAGNOSIS — Z9049 Acquired absence of other specified parts of digestive tract: Secondary | ICD-10-CM | POA: Diagnosis not present

## 2017-09-02 DIAGNOSIS — Z3A29 29 weeks gestation of pregnancy: Secondary | ICD-10-CM

## 2017-09-02 DIAGNOSIS — Z8632 Personal history of gestational diabetes: Secondary | ICD-10-CM | POA: Insufficient documentation

## 2017-09-02 DIAGNOSIS — O0933 Supervision of pregnancy with insufficient antenatal care, third trimester: Secondary | ICD-10-CM | POA: Diagnosis not present

## 2017-09-02 DIAGNOSIS — R109 Unspecified abdominal pain: Secondary | ICD-10-CM | POA: Diagnosis present

## 2017-09-02 DIAGNOSIS — Z8619 Personal history of other infectious and parasitic diseases: Secondary | ICD-10-CM | POA: Diagnosis not present

## 2017-09-02 LAB — URINALYSIS, ROUTINE W REFLEX MICROSCOPIC
Bilirubin Urine: NEGATIVE
Glucose, UA: NEGATIVE mg/dL
Hgb urine dipstick: NEGATIVE
Ketones, ur: NEGATIVE mg/dL
Nitrite: NEGATIVE
Protein, ur: NEGATIVE mg/dL
SPECIFIC GRAVITY, URINE: 1.008 (ref 1.005–1.030)
pH: 7 (ref 5.0–8.0)

## 2017-09-02 LAB — FETAL FIBRONECTIN: FETAL FIBRONECTIN: NEGATIVE

## 2017-09-02 LAB — RAPID URINE DRUG SCREEN, HOSP PERFORMED
AMPHETAMINES: NOT DETECTED
Barbiturates: NOT DETECTED
Benzodiazepines: NOT DETECTED
COCAINE: NOT DETECTED
OPIATES: NOT DETECTED
TETRAHYDROCANNABINOL: POSITIVE — AB

## 2017-09-02 MED ORDER — NIFEDIPINE ER OSMOTIC RELEASE 30 MG PO TB24
30.0000 mg | ORAL_TABLET | Freq: Two times a day (BID) | ORAL | 0 refills | Status: DC | PRN
Start: 2017-09-02 — End: 2017-09-02

## 2017-09-02 MED ORDER — LACTATED RINGERS IV BOLUS (SEPSIS)
1000.0000 mL | Freq: Once | INTRAVENOUS | Status: AC
  Administered 2017-09-02: 1000 mL via INTRAVENOUS

## 2017-09-02 MED ORDER — NIFEDIPINE 10 MG PO CAPS
10.0000 mg | ORAL_CAPSULE | ORAL | Status: DC | PRN
  Administered 2017-09-02 (×3): 10 mg via ORAL
  Filled 2017-09-02 (×3): qty 1

## 2017-09-02 MED ORDER — BETAMETHASONE SOD PHOS & ACET 6 (3-3) MG/ML IJ SUSP
12.0000 mg | Freq: Once | INTRAMUSCULAR | Status: AC
  Administered 2017-09-02: 12 mg via INTRAMUSCULAR
  Filled 2017-09-02: qty 2

## 2017-09-02 MED ORDER — NIFEDIPINE ER OSMOTIC RELEASE 30 MG PO TB24
30.0000 mg | ORAL_TABLET | Freq: Two times a day (BID) | ORAL | 0 refills | Status: DC | PRN
Start: 2017-09-02 — End: 2017-09-24

## 2017-09-02 NOTE — Discharge Instructions (Signed)

## 2017-09-02 NOTE — MAU Provider Note (Signed)
History     CSN: [id number]  Arrival date and time: 09/02/17 1312   First Provider Initiated Contact with Patient 09/02/17 1426      Chief Complaint  Patient presents with  . Contractions   HPI Cheryl Holt is a 27 y.o. G3P2001 at [redacted]w[redacted]d with di/di twins who presents with contractions. Has had no prenatal care with this pregnancy. History of cocaine & heroine use. Entered a treatment program last month; states she is on subutex & got her medicaid transportation form filled out this morning so plans on scheduling prenatal appointment.  Current symptoms began 2 hours ago. Reports feeling contractions every 3-5 minutes. Rates pain 8/10. Has not treated symptoms. Denies n/v/d, dysuria, vaginal bleeding, or LOF. Denies recent intercourse. Positive fetal movement.   OB History    Gravida Para Term Preterm AB Living   3 2 2     1    SAB TAB Ectopic Multiple Live Births           1      Past Medical History:  Diagnosis Date  . Depression   . Gestational diabetes   . Headache   . Hepatitis C 05/11/2016   [ ]  f/u quant [ ]  cmp  . Infection    UTI    Past Surgical History:  Procedure Laterality Date  . CHOLECYSTECTOMY, LAPAROSCOPIC  2014    Family History  Problem Relation Age of Onset  . Cancer Mother        pancreatic  . Migraines Mother   . Mental illness Father   . Epilepsy Father   . Migraines Father   . Hypertension Father     Social History   Tobacco Use  . Smoking status: Former Smoker    Packs/day: 0.50    Years: 10.00    Pack years: 5.00    Types: Cigarettes  . Smokeless tobacco: Never Used  Substance Use Topics  . Alcohol use: No  . Drug use: No    Comment: not since pregnancy    Allergies: No Known Allergies  Medications Prior to Admission  Medication Sig Dispense Refill Last Dose  . acetaminophen (TYLENOL) 325 MG tablet Take 650 mg by mouth every 6 (six) hours as needed for mild pain or headache.   Past Week at Unknown time  . calcium  carbonate (TUMS - DOSED IN MG ELEMENTAL CALCIUM) 500 MG chewable tablet Chew 1 tablet by mouth 2 (two) times daily as needed for indigestion or heartburn.   Past Month at Unknown time  . Cetirizine HCl 10 MG CAPS Take 1 capsule (10 mg total) by mouth daily. (Patient not taking: Reported on 07/25/2017) 30 capsule 0 Not Taking at Unknown time  . clindamycin (CLEOCIN) 150 MG capsule Take 3 capsules (450 mg total) by mouth 3 (three) times daily. 90 capsule 0   . fluticasone (FLONASE) 50 MCG/ACT nasal spray Place 2 sprays into both nostrils daily. (Patient not taking: Reported on 07/25/2017) 16 g 2 Not Taking at Unknown time  . Prenatal Vit-Fe Fumarate-FA (PRENATAL MULTIVITAMIN) TABS tablet Take 1 tablet by mouth daily at 12 noon. 30 tablet 0 07/24/2017 at Unknown time    Review of Systems  Constitutional: Negative.   Gastrointestinal: Positive for abdominal pain. Negative for diarrhea, nausea and vomiting.  Genitourinary: Negative.    Physical Exam   Blood pressure 137/69, pulse 98, temperature 98.3 F (36.8 C), temperature source Oral, resp. rate 20, height 5\' 4"  (1.626 m), weight 256 lb (116.1 kg), last menstrual  period 04/22/2017, SpO2 99 %, unknown if currently breastfeeding.  Physical Exam  Nursing note and vitals reviewed. Constitutional: She is oriented to person, place, and time. She appears well-developed and well-nourished. No distress.  HENT:  Head: Normocephalic and atraumatic.  Eyes: Conjunctivae are normal. Right eye exhibits no discharge. Left eye exhibits no discharge. No scleral icterus.  Neck: Normal range of motion.  Respiratory: Effort normal. No respiratory distress.  GI: Soft. There is no tenderness.  Genitourinary:  Genitourinary Comments: Dilation: 2.5 Effacement (%): 50 Station: -3 Exam by:: Estanislado SpireE. Alee Gressman NP   Neurological: She is alert and oriented to person, place, and time.  Skin: Skin is warm and dry. She is not diaphoretic.  Psychiatric: She has a normal mood and  affect. Her behavior is normal. Judgment and thought content normal.   Fetal Tracing: Baby A Baseline: 140 Variability: moderate Accelerations: 15x15 Decelerations: none  Baby B Baseline: 150 Variability: moderate Accelerations:15x15 Decelerations: none  Toco: Q3-5 mins  MAU Course  Procedures Results for orders placed or performed during the hospital encounter of 09/02/17 (from the past 24 hour(s))  Urinalysis, Routine w reflex microscopic     Status: Abnormal   Collection Time: 09/02/17  1:35 PM  Result Value Ref Range   Color, Urine YELLOW YELLOW   APPearance CLEAR CLEAR   Specific Gravity, Urine 1.008 1.005 - 1.030   pH 7.0 5.0 - 8.0   Glucose, UA NEGATIVE NEGATIVE mg/dL   Hgb urine dipstick NEGATIVE NEGATIVE   Bilirubin Urine NEGATIVE NEGATIVE   Ketones, ur NEGATIVE NEGATIVE mg/dL   Protein, ur NEGATIVE NEGATIVE mg/dL   Nitrite NEGATIVE NEGATIVE   Leukocytes, UA TRACE (A) NEGATIVE   RBC / HPF 0-5 0 - 5 RBC/hpf   WBC, UA 0-5 0 - 5 WBC/hpf   Bacteria, UA RARE (A) NONE SEEN   Squamous Epithelial / LPF 0-5 (A) NONE SEEN  Rapid urine drug screen (hospital performed)     Status: Abnormal   Collection Time: 09/02/17  1:35 PM  Result Value Ref Range   Opiates NONE DETECTED NONE DETECTED   Cocaine NONE DETECTED NONE DETECTED   Benzodiazepines NONE DETECTED NONE DETECTED   Amphetamines NONE DETECTED NONE DETECTED   Tetrahydrocannabinol POSITIVE (A) NONE DETECTED   Barbiturates NONE DETECTED NONE DETECTED  Fetal fibronectin     Status: None   Collection Time: 09/02/17  2:33 PM  Result Value Ref Range   Fetal Fibronectin NEGATIVE NEGATIVE    MDM Cervix dilated 2.5 cm. FFN collected. IV fluid bolus & procardia 10 mg Q 20 minutes x 3 doses given. Patient reports improvement in pain & less frequent contractions. Cervix is unchanged after observing for 2+ hours.  BMZ during this visit.  C/w Dr. Jolayne Pantheronstant. Ok to discharge home. Patient to f/u tomorrow for 2nd BMZ.    Assessment and Plan  A; 1. Preterm uterine contractions in third trimester, antepartum   2. Dichorionic diamniotic twin pregnancy in third trimester   3. [redacted] weeks gestation of pregnancy   4. No prenatal care in current pregnancy in third trimester    P: Discharge home Rx procardia 30 xl BID Return to MAU tomorrow for 2nd BMZ injection Msg to [facility] to schedule ob appt Discussed reasons to return to MAU  Judeth HornErin Misty Foutz 09/02/2017, 2:26 PM

## 2017-09-02 NOTE — MAU Note (Signed)
Pt reports contractions today,denies bleeding

## 2017-09-03 ENCOUNTER — Inpatient Hospital Stay (HOSPITAL_COMMUNITY)
Admission: AD | Admit: 2017-09-03 | Discharge: 2017-09-03 | Disposition: A | Discharge status: Home / Self Care | Payer: Medicaid Other | Source: Ambulatory Visit | Attending: Obstetrics and Gynecology | Admitting: Obstetrics and Gynecology

## 2017-09-03 ENCOUNTER — Ambulatory Visit (HOSPITAL_COMMUNITY)
Admission: EM | Admit: 2017-09-03 | Discharge: 2017-09-03 | Disposition: A | Discharge status: Home / Self Care | Payer: Medicaid Other | Attending: Family Medicine | Admitting: Family Medicine

## 2017-09-03 ENCOUNTER — Encounter (HOSPITAL_COMMUNITY): Payer: Self-pay | Admitting: Emergency Medicine

## 2017-09-03 DIAGNOSIS — Z3A29 29 weeks gestation of pregnancy: Secondary | ICD-10-CM | POA: Insufficient documentation

## 2017-09-03 DIAGNOSIS — O4703 False labor before 37 completed weeks of gestation, third trimester: Secondary | ICD-10-CM | POA: Diagnosis present

## 2017-09-03 DIAGNOSIS — L0291 Cutaneous abscess, unspecified: Secondary | ICD-10-CM | POA: Diagnosis not present

## 2017-09-03 MED ORDER — BETAMETHASONE SOD PHOS & ACET 6 (3-3) MG/ML IJ SUSP
12.0000 mg | Freq: Once | INTRAMUSCULAR | Status: AC
  Administered 2017-09-03: 12 mg via INTRAMUSCULAR
  Filled 2017-09-03: qty 2

## 2017-09-03 MED ORDER — AMOXICILLIN-POT CLAVULANATE 875-125 MG PO TABS: 1.0000 | ORAL_TABLET | Freq: Two times a day (BID) | ORAL | 0 refills | Status: AC

## 2017-09-03 NOTE — MAU Note (Signed)
Pt here for second BMZ shot.

## 2017-09-03 NOTE — ED Triage Notes (Addendum)
PT has an abscess over left breast for 2-3 weeks. PT reports it was draining, but isn't anymore. PT is pregnant

## 2017-09-03 NOTE — Discharge Instructions (Signed)
Please apply warm compresses or warm bath soaks and continue to massage the area to for further drainage.  Please take Augmentin twice daily for 7 days.  Is follow-up of swelling returns or abscess not improving.

## 2017-09-03 NOTE — ED Provider Notes (Signed)
[facility]    CSN: [id number] Arrival date & time: 09/03/17  1406     History   Chief Complaint Chief Complaint  Patient presents with  . Abscess    HPI Cheryl Holt is a 27 y.o. female 6-1/2 months pregnant presenting today with concern for abscess.  Patient has had this abscess on her left upper breast for approximately 1 month.  She is previously been on antibiotics but did not take the full course as it was making her very nauseous.  She has also done warm compresses.  She is requesting I&D today.  Denies fever.  HPI  Past Medical History:  Diagnosis Date  . Depression   . Gestational diabetes   . Headache   . Hepatitis C 05/11/2016   [ ]  f/u quant [ ]  cmp  . Infection    UTI    Patient Active Problem List   Diagnosis Date Noted  . Left breast abscess 07/25/2017  . Back pain affecting pregnancy in second trimester 07/03/2017  . Supervision of high-risk pregnancy 05/24/2016  . Hepatitis C 05/11/2016  . Obesity in pregnancy 05/10/2016  . Gestational diabetes 04/24/2016  . Rh negative status during pregnancy 04/04/2016  . GBS bacteriuria 04/04/2016  . History of gestational diabetes mellitus 03/27/2016  . Substance abuse affecting pregnancy in second trimester, antepartum 02/26/2016  . Anxiety and depression 02/26/2016  . Cannabis use disorder, moderate, dependence (HCC) 01/18/2016  . 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 Tobacco use disorder 01/18/2016  . Opioid use disorder, severe, in sustained remission (HCC) 01/18/2016  . 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 CHOLECYSTECTOMY, LAPAROSCOPIC  2014    OB History    Gravida Para Term Preterm AB Living   3 2 2     1    SAB TAB Ectopic Multiple Live Births           1       Home Medications    Prior to Admission medications   Medication Sig  Start Date End Date Taking? Authorizing Provider  buprenorphine (SUBUTEX) 8 MG SUBL SL tablet TK 1 T PO Q 8 H 08/29/17  Yes [provider]  NIFEdipine (PROCARDIA XL) 30 MG 24 hr tablet Take 1 tablet (30 mg total) by mouth 2 (two) times daily as needed (contractions). 09/02/17  Yes Judeth Horn, NP  Prenatal Vit-Fe Fumarate-FA (PRENATAL MULTIVITAMIN) TABS tablet Take 1 tablet by mouth daily at 12 noon. 05/20/17  Yes Devoria Albe, MD  amoxicillin-clavulanate (AUGMENTIN) 875-125 MG tablet Take 1 tablet by mouth every 12 (twelve) hours for 7 days. 09/03/17 09/10/17  Wieters, Hallie C, PA-C  calcium carbonate (TUMS - DOSED IN MG ELEMENTAL CALCIUM) 500 MG chewable tablet Chew 1 tablet by mouth 2 (two) times daily as needed for indigestion or heartburn.    [provider]  Cetirizine HCl 10 MG CAPS Take 1 capsule (10 mg total) by mouth daily. Patient not taking: Reported on 07/25/2017 07/05/16   Donette Larry, CNM  clindamycin (CLEOCIN) 150 MG capsule Take 3 capsules (450 mg total) by mouth 3 (three) times daily. Patient not taking: Reported on 09/02/2017 07/25/17   Jaynie Crumble, PA-C  fluticasone (FLONASE) 50 MCG/ACT nasal spray Place 2 sprays into both nostrils daily. Patient not taking: Reported on 07/25/2017 07/09/16   Freddrick March, MD    Family History Family History  Problem Relation  Age of Onset  . Cancer Mother        pancreatic  . Migraines Mother   . Mental illness Father   . Epilepsy Father   . Migraines Father   . Hypertension Father     Social History Social History   Tobacco Use  . Smoking status: Former Smoker    Packs/day: 0.50    Years: 10.00    Pack years: 5.00    Types: Cigarettes  . Smokeless tobacco: Never Used  Substance Use Topics  . Alcohol use: No  . Drug use: No    Comment: not since pregnancy     Allergies   Patient has no known allergies.   Review of Systems Review of Systems  Constitutional: Negative for fatigue and fever.    Respiratory: Negative for shortness of breath.   Cardiovascular: Negative for chest pain.  Gastrointestinal: Negative for abdominal pain, nausea and vomiting.  Skin: Positive for color change.       Abscess  Neurological: Negative for dizziness, weakness, light-headedness and headaches.     Physical Exam Triage Vital Signs ED Triage Vitals  Enc Vitals Group     BP 09/03/17 1434 (!) 145/78     Pulse Rate 09/03/17 1434 (!) 105     Resp 09/03/17 1434 16     Temp 09/03/17 1434 97.8 F (36.6 C)     Temp Source 09/03/17 1434 Oral     SpO2 09/03/17 1434 100 %     Weight 09/03/17 1433 254 lb (115.2 kg)     Height --      Head Circumference --      Peak Flow --      Pain Score 09/03/17 1432 0     Pain Loc --      Pain Edu? --      Excl. in GC? --    No data found.  Updated Vital Signs BP (!) 145/78   Pulse (!) 105   Temp 97.8 F (36.6 C) (Oral)   Resp 16   Wt 254 lb (115.2 kg)   LMP 04/22/2017 (Exact Date)   SpO2 100%   BMI 43.60 kg/m   Visual Acuity Right Eye Distance:   Left Eye Distance:   Bilateral Distance:    Right Eye Near:   Left Eye Near:    Bilateral Near:     Physical Exam  Constitutional: She appears well-developed and well-nourished. No distress.  HENT:  Head: Normocephalic and atraumatic.  Eyes: Conjunctivae are normal.  Neck: Neck supple.  Cardiovascular: Normal rate.  Pulmonary/Chest: Effort normal. No respiratory distress.  Large 3 cm area of erythema to left upper breast.  Musculoskeletal: She exhibits no edema.  Neurological: She is alert.  Skin: Skin is warm and dry.  Psychiatric: She has a normal mood and affect.  Nursing note and vitals reviewed.    UC Treatments / Results  Labs (all labs ordered are listed, but only abnormal results are displayed) Labs Reviewed - No data to display  EKG  EKG Interpretation None       Radiology No results found.  Procedures Incision and Drainage Date/Time: 09/03/2017 8:41  PM Performed by: Wieters, Junius CreamerHallie C, PA-C Authorized by: Mardella LaymanHagler, Brian, MD   Consent:    Consent obtained:  Verbal   Consent given by:  Patient   Risks discussed:  Bleeding, incomplete drainage and pain   Alternatives discussed:  Alternative treatment Location:    Type:  Abscess   Size:  3  Location:  Trunk   Trunk location:  L breast Pre-procedure details:    Skin preparation:  Betadine Anesthesia (see MAR for exact dosages):    Anesthesia method:  Local infiltration   Local anesthetic:  Lidocaine 2% w/o epi Procedure type:    Complexity:  Simple Procedure details:    Needle aspiration: no     Incision types:  Stab incision   Incision depth:  Subcutaneous   Scalpel blade:  11   Wound management:  Probed and deloculated   Drainage:  Purulent and bloody   Drainage amount:  Copious   Wound treatment:  Wound left open   Packing materials:  1/4 in iodoform gauze Post-procedure details:    Patient tolerance of procedure:  Tolerated well, no immediate complications   (including critical care time)  Medications Ordered in UC Medications - No data to display   Initial Impression / Assessment and Plan / UC Course  I have reviewed the triage vital signs and the nursing notes.  Pertinent labs & imaging results that were available during my care of the patient were reviewed by me and considered in my medical decision making (see chart for details).     I&D performed, with significant amount of drainage expressed.  Packing placed.  Will place patient on Augmentin given she is pregnant, will advise warm compresses, remove packing in approximately 24-48 hours. Discussed strict return precautions. Patient verbalized understanding and is agreeable with plan.   Final Clinical Impressions(s) / UC Diagnoses   Final diagnoses:  Abscess    ED Discharge Orders        Ordered    amoxicillin-clavulanate (AUGMENTIN) 875-125 MG tablet  Every 12 hours     09/03/17 1537        Controlled Substance Prescriptions [place] Controlled Substance Registry consulted? Not Applicable   Lew Dawes, [place] 09/03/17 2044

## 2017-09-04 ENCOUNTER — Encounter: Payer: Self-pay | Admitting: Family Medicine

## 2017-09-16 ENCOUNTER — Ambulatory Visit (INDEPENDENT_AMBULATORY_CARE_PROVIDER_SITE_OTHER): Payer: Medicaid Other | Admitting: Obstetrics and Gynecology

## 2017-09-16 ENCOUNTER — Encounter: Payer: Self-pay | Admitting: Obstetrics and Gynecology

## 2017-09-16 ENCOUNTER — Other Ambulatory Visit (HOSPITAL_COMMUNITY)
Admission: RE | Admit: 2017-09-16 | Discharge: 2017-09-16 | Disposition: A | Discharge status: Home / Self Care | Payer: Medicaid Other | Source: Ambulatory Visit | Attending: Obstetrics and Gynecology | Admitting: Obstetrics and Gynecology

## 2017-09-16 VITALS — BP 130/73 | HR 118 | Wt 254.1 lb

## 2017-09-16 DIAGNOSIS — O99323 Drug use complicating pregnancy, third trimester: Secondary | ICD-10-CM | POA: Diagnosis not present

## 2017-09-16 DIAGNOSIS — F1121 Opioid dependence, in remission: Secondary | ICD-10-CM | POA: Diagnosis not present

## 2017-09-16 DIAGNOSIS — O0993 Supervision of high risk pregnancy, unspecified, third trimester: Secondary | ICD-10-CM | POA: Diagnosis not present

## 2017-09-16 DIAGNOSIS — Z8632 Personal history of gestational diabetes: Secondary | ICD-10-CM

## 2017-09-16 DIAGNOSIS — Z6791 Unspecified blood type, Rh negative: Secondary | ICD-10-CM | POA: Diagnosis not present

## 2017-09-16 DIAGNOSIS — B182 Chronic viral hepatitis C: Secondary | ICD-10-CM

## 2017-09-16 DIAGNOSIS — O99322 Drug use complicating pregnancy, second trimester: Secondary | ICD-10-CM

## 2017-09-16 DIAGNOSIS — O26893 Other specified pregnancy related conditions, third trimester: Secondary | ICD-10-CM

## 2017-09-16 MED ORDER — HYDROXYZINE HCL 25 MG PO TABS: 25.0000 mg | ORAL_TABLET | Freq: Every evening | ORAL | 2 refills | Status: DC | PRN

## 2017-09-16 NOTE — Patient Instructions (Signed)
Third Trimester of Pregnancy The third trimester is from week 28 through week 40 (months 7 through 9). The third trimester is a time when the unborn baby (fetus) is growing rapidly. At the end of the ninth month, the fetus is about 20 inches in length and weighs 6-10 pounds. Body changes during your third trimester Your body will continue to go through many changes during pregnancy. The changes vary from woman to woman. During the third trimester:  Your weight will continue to increase. You can expect to gain 25-35 pounds (11-16 kg) by the end of the pregnancy.  You may begin to get stretch marks on your hips, abdomen, and breasts.  You may urinate more often because the fetus is moving lower into your pelvis and pressing on your bladder.  You may develop or continue to have heartburn. This is caused by increased hormones that slow down muscles in the digestive tract.  You may develop or continue to have constipation because increased hormones slow digestion and cause the muscles that push waste through your intestines to relax.  You may develop hemorrhoids. These are swollen veins (varicose veins) in the rectum that can itch or be painful.  You may develop swollen, bulging veins (varicose veins) in your legs.  You may have increased body aches in the pelvis, back, or thighs. This is due to weight gain and increased hormones that are relaxing your joints.  You may have changes in your hair. These can include thickening of your hair, rapid growth, and changes in texture. Some women also have hair loss during or after pregnancy, or hair that feels dry or thin. Your hair will most likely return to normal after your baby is born.  Your breasts will continue to grow and they will continue to become tender. A yellow fluid (colostrum) may leak from your breasts. This is the first milk you are producing for your baby.  Your belly button may stick out.  You may notice more swelling in your hands,  face, or ankles.  You may have increased tingling or numbness in your hands, arms, and legs. The skin on your belly may also feel numb.  You may feel short of breath because of your expanding uterus.  You may have more problems sleeping. This can be caused by the size of your belly, increased need to urinate, and an increase in your body's metabolism.  You may notice the fetus "dropping," or moving lower in your abdomen (lightening).  You may have increased vaginal discharge.  You may notice your joints feel loose and you may have pain around your pelvic bone.  What to expect at prenatal visits You will have prenatal exams every 2 weeks until week 36. Then you will have weekly prenatal exams. During a routine prenatal visit:  You will be weighed to make sure you and the baby are growing normally.  Your blood pressure will be taken.  Your abdomen will be measured to track your baby's growth.  The fetal heartbeat will be listened to.  Any test results from the previous visit will be discussed.  You may have a cervical check near your due date to see if your cervix has softened or thinned (effaced).  You will be tested for Group B streptococcus. This happens between 35 and 37 weeks.  Your health care provider may ask you:  What your birth plan is.  How you are feeling.  If you are feeling the baby move.  If you have had   any abnormal symptoms, such as leaking fluid, bleeding, severe headaches, or abdominal cramping.  If you are using any tobacco products, including cigarettes, chewing tobacco, and electronic cigarettes.  If you have any questions.  Other tests or screenings that may be performed during your third trimester include:  Blood tests that check for low iron levels (anemia).  Fetal testing to check the health, activity level, and growth of the fetus. Testing is done if you have certain medical conditions or if there are problems during the  pregnancy.  Nonstress test (NST). This test checks the health of your baby to make sure there are no signs of problems, such as the baby not getting enough oxygen. During this test, a belt is placed around your belly. The baby is made to move, and its heart rate is monitored during movement.  What is false labor? False labor is a condition in which you feel small, irregular tightenings of the muscles in the womb (contractions) that usually go away with rest, changing position, or drinking water. These are called Braxton Hicks contractions. Contractions may last for hours, days, or even weeks before true labor sets in. If contractions come at regular intervals, become more frequent, increase in intensity, or become painful, you should see your health care provider. What are the signs of labor?  Abdominal cramps.  Regular contractions that start at 10 minutes apart and become stronger and more frequent with time.  Contractions that start on the top of the uterus and spread down to the lower abdomen and back.  Increased pelvic pressure and dull back pain.  A watery or bloody mucus discharge that comes from the vagina.  Leaking of amniotic fluid. This is also known as your "water breaking." It could be a slow trickle or a gush. Let your health care provider know if it has a color or strange odor. If you have any of these signs, call your health care provider right away, even if it is before your due date. Follow these instructions at home: Medicines  Follow your health care provider's instructions regarding medicine use. Specific medicines may be either safe or unsafe to take during pregnancy.  Take a prenatal vitamin that contains at least 600 micrograms (mcg) of folic acid.  If you develop constipation, try taking a stool softener if your health care provider approves. Eating and drinking  Eat a balanced diet that includes fresh fruits and vegetables, whole grains, good sources of protein  such as meat, eggs, or tofu, and low-fat dairy. Your health care provider will help you determine the amount of weight gain that is right for you.  Avoid raw meat and uncooked cheese. These carry germs that can cause birth defects in the baby.  If you have low calcium intake from food, talk to your health care provider about whether you should take a daily calcium supplement.  Eat four or five small meals rather than three large meals a day.  Limit foods that are high in fat and processed sugars, such as fried and sweet foods.  To prevent constipation: ? Drink enough fluid to keep your urine clear or pale yellow. ? Eat foods that are high in fiber, such as fresh fruits and vegetables, whole grains, and beans. Activity  Exercise only as directed by your health care provider. Most women can continue their usual exercise routine during pregnancy. Try to exercise for 30 minutes at least 5 days a week. Stop exercising if you experience uterine contractions.  Avoid heavy   lifting.  Do not exercise in extreme heat or humidity, or at high altitudes.  Wear low-heel, comfortable shoes.  Practice good posture.  You may continue to have sex unless your health care provider tells you otherwise. Relieving pain and discomfort  Take frequent breaks and rest with your legs elevated if you have leg cramps or low back pain.  Take warm sitz baths to soothe any pain or discomfort caused by hemorrhoids. Use hemorrhoid cream if your health care provider approves.  Wear a good support bra to prevent discomfort from breast tenderness.  If you develop varicose veins: ? Wear support pantyhose or compression stockings as told by your healthcare provider. ? Elevate your feet for 15 minutes, 3-4 times a day. Prenatal care  Write down your questions. Take them to your prenatal visits.  Keep all your prenatal visits as told by your health care provider. This is important. Safety  Wear your seat belt at  all times when driving.  Make a list of emergency phone numbers, including numbers for family, friends, the hospital, and police and fire departments. General instructions  Avoid cat litter boxes and soil used by cats. These carry germs that can cause birth defects in the baby. If you have a cat, ask someone to clean the litter box for you.  Do not travel far distances unless it is absolutely necessary and only with the approval of your health care provider.  Do not use hot tubs, steam rooms, or saunas.  Do not drink alcohol.  Do not use any products that contain nicotine or tobacco, such as cigarettes and e-cigarettes. If you need help quitting, ask your health care provider.  Do not use any medicinal herbs or unprescribed drugs. These chemicals affect the formation and growth of the baby.  Do not douche or use tampons or scented sanitary pads.  Do not cross your legs for long periods of time.  To prepare for the arrival of your baby: ? Take prenatal classes to understand, practice, and ask questions about labor and delivery. ? Make a trial run to the hospital. ? Visit the hospital and tour the maternity area. ? Arrange for maternity or paternity leave through employers. ? Arrange for family and friends to take care of pets while you are in the hospital. ? Purchase a rear-facing car seat and make sure you know how to install it in your car. ? Pack your hospital bag. ? Prepare the baby's nursery. Make sure to remove all pillows and stuffed animals from the baby's crib to prevent suffocation.  Visit your dentist if you have not gone during your pregnancy. Use a soft toothbrush to brush your teeth and be gentle when you floss. Contact a health care provider if:  You are unsure if you are in labor or if your water has broken.  You become dizzy.  You have mild pelvic cramps, pelvic pressure, or nagging pain in your abdominal area.  You have lower back pain.  You have persistent  nausea, vomiting, or diarrhea.  You have an unusual or bad smelling vaginal discharge.  You have pain when you urinate. Get help right away if:  Your water breaks before 37 weeks.  You have regular contractions less than 5 minutes apart before 37 weeks.  You have a fever.  You are leaking fluid from your vagina.  You have spotting or bleeding from your vagina.  You have severe abdominal pain or cramping.  You have rapid weight loss or weight gain.    You have shortness of breath with chest pain.  You notice sudden or extreme swelling of your face, hands, ankles, feet, or legs.  Your baby makes fewer than 10 movements in 2 hours.  You have severe headaches that do not go away when you take medicine.  You have vision changes. Summary  The third trimester is from week 28 through week 40, months 7 through 9. The third trimester is a time when the unborn baby (fetus) is growing rapidly.  During the third trimester, your discomfort may increase as you and your baby continue to gain weight. You may have abdominal, leg, and back pain, sleeping problems, and an increased need to urinate.  During the third trimester your breasts will keep growing and they will continue to become tender. A yellow fluid (colostrum) may leak from your breasts. This is the first milk you are producing for your baby.  False labor is a condition in which you feel small, irregular tightenings of the muscles in the womb (contractions) that eventually go away. These are called Braxton Hicks contractions. Contractions may last for hours, days, or even weeks before true labor sets in.  Signs of labor can include: abdominal cramps; regular contractions that start at 10 minutes apart and become stronger and more frequent with time; watery or bloody mucus discharge that comes from the vagina; increased pelvic pressure and dull back pain; and leaking of amniotic fluid. This information is not intended to replace advice  given to you by your health care provider. Make sure you discuss any questions you have with your health care provider. Document Released: 07/03/2001 Document Revised: 12/15/2015 Document Reviewed: 09/09/2012 Elsevier Interactive Patient Education  2017 [employer]. Multiple Pregnancy Having a multiple pregnancy means that a woman is carrying more than one baby at a time. She may be pregnant with twins, triplets, or more. The majority of multiple pregnancies are twins. Naturally conceiving triplets or more (higher-order multiples) is rare. Multiple pregnancies are riskier than single pregnancies. A woman with a multiple pregnancy is more likely to have certain problems during her pregnancy. Therefore, she will need to have more frequent appointments for prenatal care. How does a multiple pregnancy happen? A multiple pregnancy happens when:  The woman's body releases more than one egg at a time, and then each egg gets fertilized by a different sperm. ? This is the most common type of multiple pregnancy. ? Twins or other multiples produced this way are fraternal. They are no more alike than non-multiple siblings are.  One sperm fertilizes one egg, which then divides into more than one embryo. ? Twins or other multiples produced this way are identical. Identical multiples are always the same gender, and they look very much alike.  Who is most likely to have a multiple pregnancy? A multiple pregnancy is more likely to develop in women who:  Have had fertility treatment, especially if the treatment included fertility drugs.  Are older than 28 years of age.  Have already had four or more children.  Have a family history of multiple pregnancy.  How is a multiple pregnancy diagnosed? A multiple pregnancy may be diagnosed based on:  Symptoms such as: ? Rapid weight gain in the first 3 months of pregnancy (first trimester). ? More severe nausea and breast tenderness than what is typical of a  single pregnancy. ? The uterus measuring larger than what is normal for the stage of the pregnancy.  Blood tests that detect a higher-than-normal level of human chorionic  gonadotropin (hCG). This is a hormone that your body produces in early pregnancy.  Ultrasound exam. This is used to confirm that you are carrying multiples.  What risks are associated with multiple pregnancy? A multiple pregnancy puts you at a higher risk for certain problems during or after your pregnancy, including:  Having your babies delivered before you have reached a full-term pregnancy (preterm birth). A full-term pregnancy lasts for at least 37 weeks. Babies born before 37 weeks may have a higher risk of a variety of health problems, such as breathing problems, feeding difficulties, cerebral palsy, and learning disabilities.  Diabetes.  Preeclampsia. This is a serious condition that causes high blood pressure along with other symptoms, such as swelling and headaches, during pregnancy.  Excessive blood loss after childbirth (postpartum hemorrhage).  Postpartum depression.  Low birth weight of the babies.  How will having a multiple pregnancy affect my care? Your health care provider will want to monitor you more closely during your pregnancy to make sure that your babies are growing normally and that you are healthy. Follow these instructions at home: Because your pregnancy is considered to be high risk, you will need to work closely with your health care team. You may also need to make some lifestyle changes. These may include the following: Eating and drinking  Increase your nutrition. ? Follow your health care provider's recommendations for weight gain. You may need to gain a little extra weight when you are pregnant with multiples. ? Eat healthy snacks often throughout the day. This can add calories and reduce nausea.  Drink enough fluid to keep your urine clear or pale yellow.  Take prenatal  vitamins. Activity By 20-24 weeks, you may need to limit your activities.  Avoid activities and work that take a lot of effort (are strenuous).  Ask your health care provider when you should stop having sexual intercourse.  Rest often.  General instructions  Do not use any products that contain nicotine or tobacco, such as cigarettes and e-cigarettes. If you need help quitting, ask your health care provider.  Do not drink alcohol or use illegal drugs.  Take over-the-counter and prescription medicines only as told by your health care provider.  Arrange for extra help around the house.  Keep all follow-up visits and all prenatal visits as told by your health care provider. This is important. Contact a health care provider if:  You have dizziness.  You have persistent nausea, vomiting, or diarrhea.  You are having trouble gaining weight.  You have feelings of depression or other emotions that are interfering with your normal activities. Get help right away if:  You have a fever.  You have pain with urination.  You have fluid leaking from your vagina.  You have a bad-smelling vaginal discharge.  You notice increased swelling in your face, hands, legs, or ankles.  You have spotting or bleeding from your vagina.  You have pelvic cramps, pelvic pressure, or nagging pain in your abdomen or lower back.  You are having regular contractions.  You develop a severe headache, with or without visual changes.  You have shortness of breath or chest pain.  You notice less fetal movement, or no fetal movement. This information is not intended to replace advice given to you by your health care provider. Make sure you discuss any questions you have with your health care provider. Document Released: 04/17/2008 Document Revised: 03/09/2016 Document Reviewed: 03/09/2016 Elsevier Interactive Patient Education  [employer]2018 Elsevier Inc[employer]

## 2017-09-16 NOTE — Progress Notes (Signed)
Subjective:  Cheryl Holt is a 27 y.o. [id number] at 1w4dbeing seen today for her first OB visit. Pt with Di/Di twins. [facility], H/O PTL stable on Procardia, S/P BMZ x 2, H/O Hep C , and H/O Breast abscess, resolved.   She is currently monitored for the following issues for this high-risk pregnancy and has Major depressive disorder, recurrent severe without psychotic features ([place]; Cannabis use disorder, moderate, dependence ([place]; Sedative, hypnotic or anxiolytic use disorder, mild, abuse ([place]; Tobacco use disorder; Opioid use disorder, severe, in sustained remission ([place]; Substance abuse affecting pregnancy in second trimester, antepartum; Anxiety and depression; History of gestational diabetes mellitus; Rh negative status during pregnancy; Obesity in pregnancy; Hepatitis C; Supervision of high-risk pregnancy; Back pain affecting pregnancy in second trimester; Left breast abscess; and Preterm labor on their problem list.  Patient reports no complaints.  Contractions: Irritability. Vag. Bleeding: None.  Movement: Present. Denies leaking of fluid.   The following portions of the patient's history were reviewed and updated as appropriate: allergies, current medications, past family history, past medical history, past social history, past surgical history and problem list. Problem list updated.  Objective:   Vitals:   09/16/17 1426  BP: 130/73  Pulse: (!) 118  Weight: 254 lb 1.6 oz (115.3 kg)    Fetal Status: Fetal Heart Rate (bpm): 148/152   Movement: Present     General:  Alert, oriented and cooperative. Patient is in no acute distress.  Skin: Skin is warm and dry. No rash noted.   Cardiovascular: Normal heart rate noted  Respiratory: Normal respiratory effort, no problems with respiration noted  Abdomen: Soft, gravid, appropriate for gestational age. Pain/Pressure: Present     Pelvic:  Cervical exam deferred         Extremities: Normal range of motion.  Edema: Trace  Mental Status: Normal mood and affect. Normal behavior. Normal judgment and thought content.   Urinalysis:      Assessment and Plan:  Pregnancy: [id number]. Supervision of high risk pregnancy in third trimester Stable Pap in 9/17 normal, not repeated today Prenatal labs and care reviewed with pt Twin gestation reviewed BTL papers signed today  - Culture, OB Urine - Hemoglobinopathy Evaluation - Obstetric Panel, Including HIV - SMN1 COPY NUMBER ANALYSIS (SMA Carrier Screen) - [place] OB DETAIL +14 WK; Future - Cervicovaginal ancillary only  2. History of gestational diabetes mellitus Return this week for Glucola - Hemoglobin A1c  3. Chronic hepatitis C without hepatic coma (HCC)  - Comp Met (CMET) - HCV RNA quant  4. Substance abuse affecting pregnancy in second trimester, antepartum Stable [facility] Rh negative status during pregnancy in third trimester   6. Opioid use disorder, severe, in sustained remission ([place] above  7. Preterm labor in third trimester without delivery Stable Continue with Procardia 30 XL bid  Preterm labor symptoms and general obstetric precautions including but not limited to vaginal bleeding, contractions, leaking of fluid and fetal movement were reviewed in detail with the patient. Please refer to After Visit Summary for other counseling recommendations.  Return in about 2 weeks (around 09/30/2017).   ErChancy MilroyMD

## 2017-09-17 ENCOUNTER — Other Ambulatory Visit: Payer: Self-pay | Admitting: Obstetrics and Gynecology

## 2017-09-17 ENCOUNTER — Ambulatory Visit (HOSPITAL_COMMUNITY): Admission: RE | Admit: 2017-09-17 | Payer: Medicaid Other | Source: Ambulatory Visit

## 2017-09-17 DIAGNOSIS — O30043 Twin pregnancy, dichorionic/diamniotic, third trimester: Secondary | ICD-10-CM

## 2017-09-17 DIAGNOSIS — Z363 Encounter for antenatal screening for malformations: Secondary | ICD-10-CM

## 2017-09-17 DIAGNOSIS — O0993 Supervision of high risk pregnancy, unspecified, third trimester: Secondary | ICD-10-CM

## 2017-09-17 DIAGNOSIS — Z3A31 31 weeks gestation of pregnancy: Secondary | ICD-10-CM

## 2017-09-17 LAB — CERVICOVAGINAL ANCILLARY ONLY
Chlamydia: NEGATIVE
Neisseria Gonorrhea: NEGATIVE
Trichomonas: NEGATIVE

## 2017-09-18 ENCOUNTER — Encounter: Payer: Self-pay | Admitting: Obstetrics & Gynecology

## 2017-09-18 DIAGNOSIS — O99320 Drug use complicating pregnancy, unspecified trimester: Secondary | ICD-10-CM

## 2017-09-18 DIAGNOSIS — O30043 Twin pregnancy, dichorionic/diamniotic, third trimester: Secondary | ICD-10-CM | POA: Insufficient documentation

## 2017-09-18 DIAGNOSIS — F1120 Opioid dependence, uncomplicated: Secondary | ICD-10-CM | POA: Insufficient documentation

## 2017-09-18 DIAGNOSIS — B189 Chronic viral hepatitis, unspecified: Secondary | ICD-10-CM | POA: Insufficient documentation

## 2017-09-18 DIAGNOSIS — O98413 Viral hepatitis complicating pregnancy, third trimester: Secondary | ICD-10-CM

## 2017-09-18 LAB — CULTURE, OB URINE

## 2017-09-18 LAB — URINE CULTURE, OB REFLEX

## 2017-09-19 ENCOUNTER — Encounter: Payer: Self-pay | Admitting: *Deleted

## 2017-09-19 LAB — URINE CYTOLOGY ANCILLARY ONLY
BACTERIAL VAGINITIS: POSITIVE — AB
Candida vaginitis: NEGATIVE

## 2017-09-20 ENCOUNTER — Telehealth: Payer: Self-pay | Admitting: General Practice

## 2017-09-20 DIAGNOSIS — B9689 Other specified bacterial agents as the cause of diseases classified elsewhere: Secondary | ICD-10-CM

## 2017-09-20 DIAGNOSIS — N760 Acute vaginitis: Principal | ICD-10-CM

## 2017-09-20 MED ORDER — METRONIDAZOLE 500 MG PO TABS: 500.0000 mg | ORAL_TABLET | Freq: Two times a day (BID) | ORAL | 0 refills | Status: DC

## 2017-09-20 NOTE — Telephone Encounter (Signed)
Called patient, no answer- unable to leave message as phone just rings then disconnects. Will send letter

## 2017-09-20 NOTE — Telephone Encounter (Signed)
-----   Message from Hermina StaggersMichael L Ervin, MD sent at 09/20/2017  8:52 AM EST ----- Flagyl 500 mg po bid x7 days for BV Thanks Casimiro NeedleMichael

## 2017-09-24 ENCOUNTER — Ambulatory Visit (HOSPITAL_COMMUNITY): Admission: RE | Admit: 2017-09-24 | Payer: Medicaid Other | Source: Ambulatory Visit

## 2017-09-24 ENCOUNTER — Other Ambulatory Visit: Payer: Self-pay | Admitting: Student

## 2017-09-24 LAB — COMPREHENSIVE METABOLIC PANEL
A/G RATIO: 1.3 (ref 1.2–2.2)
ALBUMIN: 3.4 g/dL — AB (ref 3.5–5.5)
ALT: 21 IU/L (ref 0–32)
AST: 18 IU/L (ref 0–40)
Alkaline Phosphatase: 144 IU/L — ABNORMAL HIGH (ref 39–117)
BILIRUBIN TOTAL: 0.4 mg/dL (ref 0.0–1.2)
BUN / CREAT RATIO: 12 (ref 9–23)
BUN: 7 mg/dL (ref 6–20)
CHLORIDE: 101 mmol/L (ref 96–106)
CO2: 19 mmol/L — ABNORMAL LOW (ref 20–29)
Calcium: 9.7 mg/dL (ref 8.7–10.2)
Creatinine, Ser: 0.57 mg/dL (ref 0.57–1.00)
GFR calc non Af Amer: 127 mL/min/{1.73_m2} (ref >59)
GFR, EST AFRICAN AMERICAN: 147 mL/min/{1.73_m2} (ref >59)
GLOBULIN, TOTAL: 2.6 g/dL (ref 1.5–4.5)
GLUCOSE: 109 mg/dL — AB (ref 65–99)
Potassium: 4.6 mmol/L (ref 3.5–5.2)
SODIUM: 137 mmol/L (ref 134–144)
Total Protein: 6 g/dL (ref 6.0–8.5)

## 2017-09-24 LAB — OBSTETRIC PANEL, INCLUDING HIV
ANTIBODY SCREEN: NEGATIVE
BASOS: 0 %
Basophils Absolute: 0 10*3/uL (ref 0.0–0.2)
EOS (ABSOLUTE): 0.2 10*3/uL (ref 0.0–0.4)
Eos: 1 %
HEMATOCRIT: 37.3 % (ref 34.0–46.6)
HEMOGLOBIN: 12.8 g/dL (ref 11.1–15.9)
HIV Screen 4th Generation wRfx: NONREACTIVE
Hepatitis B Surface Ag: NEGATIVE
Immature Grans (Abs): 0.1 10*3/uL (ref 0.0–0.1)
Immature Granulocytes: 1 %
LYMPHS ABS: 2 10*3/uL (ref 0.7–3.1)
Lymphs: 17 %
MCH: 29.7 pg (ref 26.6–33.0)
MCHC: 34.3 g/dL (ref 31.5–35.7)
MCV: 87 fL (ref 79–97)
MONOS ABS: 0.9 10*3/uL (ref 0.1–0.9)
Monocytes: 8 %
Neutrophils Absolute: 8.4 10*3/uL — ABNORMAL HIGH (ref 1.4–7.0)
Neutrophils: 73 %
Platelets: 265 10*3/uL (ref 150–379)
RBC: 4.31 x10E6/uL (ref 3.77–5.28)
RDW: 13.6 % (ref 12.3–15.4)
RH TYPE: NEGATIVE
RPR Ser Ql: NONREACTIVE
Rubella Antibodies, IGG: 3.77 index (ref >0.99)
WBC: 11.4 10*3/uL — AB (ref 3.4–10.8)

## 2017-09-24 LAB — HCV RNA QUANT
HCV LOG10: 5.597 {Log_IU}/mL
Hepatitis C Quantitation: 395000 IU/mL

## 2017-09-24 LAB — SMN1 COPY NUMBER ANALYSIS (SMA CARRIER SCREENING)

## 2017-09-24 LAB — HEMOGLOBINOPATHY EVALUATION
Ferritin: 21 ng/mL (ref 15–150)
HGB A: 97.8 % (ref 96.4–98.8)
HGB C: 0 %
HGB S: 0 %
HGB SOLUBILITY: NEGATIVE
HGB VARIANT: 0 %
Hgb A2 Quant: 2.2 % (ref 1.8–3.2)
Hgb F Quant: 0 % (ref 0.0–2.0)

## 2017-09-24 LAB — HEMOGLOBIN A1C
Est. average glucose Bld gHb Est-mCnc: 108 mg/dL
Hgb A1c MFr Bld: 5.4 % (ref 4.8–5.6)

## 2017-09-25 ENCOUNTER — Encounter: Payer: Self-pay | Admitting: *Deleted

## 2017-09-25 MED ORDER — NIFEDIPINE ER OSMOTIC RELEASE 30 MG PO TB24: 30.0000 mg | ORAL_TABLET | Freq: Two times a day (BID) | ORAL | 0 refills | Status: AC | PRN

## 2017-09-27 ENCOUNTER — Encounter (HOSPITAL_COMMUNITY): Payer: Self-pay | Admitting: Obstetrics and Gynecology

## 2017-10-01 ENCOUNTER — Other Ambulatory Visit (HOSPITAL_COMMUNITY): Payer: Medicaid Other

## 2017-10-03 ENCOUNTER — Other Ambulatory Visit: Payer: Self-pay | Admitting: General Practice

## 2017-10-03 ENCOUNTER — Other Ambulatory Visit: Payer: Self-pay

## 2017-10-03 ENCOUNTER — Encounter: Payer: Self-pay | Admitting: Obstetrics and Gynecology

## 2017-10-03 DIAGNOSIS — O0993 Supervision of high risk pregnancy, unspecified, third trimester: Secondary | ICD-10-CM

## 2017-10-04 ENCOUNTER — Ambulatory Visit (HOSPITAL_COMMUNITY): Payer: Medicaid Other

## 2017-10-08 ENCOUNTER — Encounter: Payer: Self-pay | Admitting: *Deleted

## 2017-10-11 ENCOUNTER — Ambulatory Visit: Payer: Self-pay

## 2017-10-15 ENCOUNTER — Encounter (HOSPITAL_BASED_OUTPATIENT_CLINIC_OR_DEPARTMENT_OTHER): Payer: Self-pay | Admitting: Emergency Medicine

## 2017-10-15 ENCOUNTER — Emergency Department (HOSPITAL_BASED_OUTPATIENT_CLINIC_OR_DEPARTMENT_OTHER)
Admission: EM | Admit: 2017-10-15 | Discharge: 2017-10-15 | Disposition: A | Discharge status: Home / Self Care | Payer: Medicaid Other | Attending: Emergency Medicine | Admitting: Emergency Medicine

## 2017-10-15 ENCOUNTER — Other Ambulatory Visit: Payer: Self-pay

## 2017-10-15 DIAGNOSIS — R21 Rash and other nonspecific skin eruption: Secondary | ICD-10-CM | POA: Insufficient documentation

## 2017-10-15 DIAGNOSIS — Z79899 Other long term (current) drug therapy: Secondary | ICD-10-CM | POA: Diagnosis not present

## 2017-10-15 DIAGNOSIS — Z87891 Personal history of nicotine dependence: Secondary | ICD-10-CM | POA: Diagnosis not present

## 2017-10-15 MED ORDER — PERMETHRIN 5 % EX CREA: TOPICAL_CREAM | CUTANEOUS | 0 refills | Status: DC

## 2017-10-15 MED FILL — PERMETHRIN 5% CREAM: 5 | 7 days supply | Qty: 60 | Fill #0

## 2017-10-15 NOTE — ED Notes (Signed)
ED Provider at bedside. 

## 2017-10-15 NOTE — Discharge Instructions (Signed)
You were seen in the emergency department today for a rash, we are treating you for scabies.  Apply the cream to your prescribed head to toe, leave this on for 8-12 hours, then rinse off in the shower or bathtub.  Repeat this process in 1 week.  Follow-up with your primary care provider in 2 weeks for reevaluation of your symptoms.  Return to the emergency department for any new or worsening symptoms or any other concerns you may have.

## 2017-10-15 NOTE — ED Triage Notes (Signed)
Patient states that she has been itching all over after staying at a hotel

## 2017-10-15 NOTE — ED Provider Notes (Signed)
[facility] EMERGENCY DEPARTMENT Provider Note   CSN: [id number] Arrival date & time: 10/15/17  1555     History   Chief Complaint Chief Complaint  Patient presents with  . Rash    HPI Cheryl Holt is a 27 y.o. female with history of tobacco abuse, hepatitis C, and substance abuse who presents the emergency department today complaining of a rash over the past 2 days.  Patient states that she stayed in a hotel 3 days ago.  States subsequent days she noticed rash to hands, face, and groin region.  States that the rash is pruritic.  No specific alleviating or aggravating factors.  States this feels similar to previous scabies.  States her husband has a similar rash.  Denies difficulty breathing, feeling of throat closing, facial swelling, new products/drugs, alcohol use, or drug use.  HPI  Past Medical History:  Diagnosis Date  . Depression   . Gestational diabetes   . Headache   . Hepatitis C 05/11/2016   [ ]  f/u quant [ ]  cmp  . Infection    UTI    Patient Active Problem List   Diagnosis Date Noted  . Pregnancy complicated by subutex maintenance, antepartum (HCC) 09/18/2017  . Dichorionic diamniotic twin pregnancy in third trimester 09/18/2017  . Chronic viral hepatitis affecting pregnancy in third trimester (HCC) 09/18/2017  . Preterm labor 09/16/2017  . Left breast abscess 07/25/2017  . Supervision of high-risk pregnancy 05/24/2016  . Hepatitis C 05/11/2016  . Obesity in pregnancy 05/10/2016  . Rh negative status during pregnancy 04/04/2016  . History of gestational diabetes mellitus 03/27/2016  . Substance abuse complicating pregnancy in third trimester, antepartum 02/26/2016  . Anxiety and depression 02/26/2016  . Cannabis use disorder, moderate, dependence (HCC) 01/18/2016  . 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 Tobacco use disorder 01/18/2016  . Opioid use disorder, severe, in sustained remission (HCC)  01/18/2016  . 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 CHOLECYSTECTOMY, LAPAROSCOPIC  2014     OB History    Gravida  5   Para  2   Term  2   Preterm  0   AB  2   Living  2     SAB  0   TAB  2   Ectopic      Multiple      Live Births  1            Home Medications    Prior to Admission medications   Medication Sig Start Date End Date Taking? Authorizing Provider  TraZODone & Diet Manage Prod (TRAZAMINE PO) Take by mouth.   Yes [provider]  buprenorphine (SUBUTEX) 8 MG SUBL SL tablet TK 1 T PO Q 8 H 08/29/17   [provider]  calcium carbonate (TUMS - DOSED IN MG ELEMENTAL CALCIUM) 500 MG chewable tablet Chew 1 tablet by mouth 2 (two) times daily as needed for indigestion or heartburn.    [provider]  hydrOXYzine (ATARAX/VISTARIL) 25 MG tablet Take 1 tablet (25 mg total) by mouth at bedtime as needed for itching. 09/16/17   Hermina Staggers, MD  metroNIDAZOLE (FLAGYL) 500 MG tablet Take 1 tablet (500 mg total) by mouth 2 (two) times daily. 09/20/17   Hermina Staggers, MD  NIFEdipine (PROCARDIA XL) 30 MG 24 hr tablet Take 1 tablet (30 mg total) by mouth  2 (two) times daily as needed (contractions). 09/25/17   Judeth HornLawrence, Erin, NP  Prenatal Vit-Fe Fumarate-FA (PRENATAL MULTIVITAMIN) TABS tablet Take 1 tablet by mouth daily at 12 noon. 05/20/17   Devoria AlbeKnapp, Iva, MD    Family History Family History  Problem Relation Age of Onset  . Cancer Mother        pancreatic  . Migraines Mother   . Mental illness Father   . Epilepsy Father   . Migraines Father   . Hypertension Father     Social History Social History   Tobacco Use  . Smoking status: Former Smoker    Packs/day: 0.50    Years: 10.00    Pack years: 5.00    Types: Cigarettes  . Smokeless tobacco: Never Used  Substance Use Topics  . Alcohol use: No  . Drug use: No     Types: Marijuana    Comment: not since pregnancy    Allergies   Patient has no known allergies.   Review of Systems Review of Systems  Constitutional: Negative for chills and fever.  HENT: Negative for facial swelling and trouble swallowing.   Respiratory: Negative for shortness of breath.   Skin: Positive for rash.     Physical Exam Updated Vital Signs BP 118/79 (BP Location: Left Arm)   Pulse 100   Temp 98.8 F (37.1 C) (Oral)   Resp 18   Ht 5\' 5"  (1.651 m)   Wt 104.3 kg (230 lb)   LMP 04/22/2017 (Exact Date)   SpO2 100%   Breastfeeding? Unknown   BMI 38.27 kg/m   Physical Exam  Constitutional: She appears well-developed and well-nourished. No distress.  HENT:  Head: Normocephalic and atraumatic.  Mouth/Throat: Uvula is midline and oropharynx is clear and moist. No oropharyngeal exudate, posterior oropharyngeal edema or posterior oropharyngeal erythema.  Patient's airways patent.  She is tolerating her own secretions without difficulty.  No drooling.  No trismus.  No evidence of angioedema.  Eyes: Conjunctivae are normal. Right eye exhibits no discharge. Left eye exhibits no discharge.  Pulmonary/Chest: Effort normal. No stridor. No respiratory distress. She has no wheezes. She has no rhonchi. She has no rales.  Neurological: She is alert.  Clear speech.   Skin: Rash (Papular rash to dorsal aspect of bilateral hands and arms, face, and groin region.  There are lesions to the inter-webspace.  There are multiple scabbed over regions which appear consistent with potential picking/scratching of the skin.) noted. No purpura noted. Rash is not nodular, not pustular, not vesicular and not urticarial.  Additionally patient has a well healing surgical scar to her suprapubic region consistent with history of C-section.  No surrounding erythema or discharge.  Psychiatric: She has a normal mood and affect. Her behavior is normal. Thought content normal.  Nursing note and vitals  reviewed.    ED Treatments / Results  Labs (all labs ordered are listed, but only abnormal results are displayed) Labs Reviewed - No data to display  EKG None  Radiology No results found.  Procedures Procedures (including critical care time)  Medications Ordered in ED Medications - No data to display   Initial Impression / Assessment and Plan / ED Course  I have reviewed the triage vital signs and the nursing notes.  Pertinent labs & imaging results that were available during my care of the patient were reviewed by me and considered in my medical decision making (see chart for details).   Patient presents with rash somewhat suspicious for scabies given  history of staying in a hotel and history of similar with prior diagnosis of scabies. Patient denies any difficulty breathing or swallowing. Airway is patent without stridor. She is tolerating her own secretions without difficulty; no angioedema. No blisters, no pustules, no warmth, no draining sinus tracts, no superficial abscesses, no bullous impetigo, no vesicles, no desquamation, no target lesions with dusky purpura or a central bulla. Not tender to touch. No concern for superimposed infection. No concern for SJS, TEN, TSS, tick borne illness, syphilis or other life-threatening condition. Will treat with Permethrin cream for possible scabies. She has also been advised to clean entire household including washing sheets. I discussed treatment plan, need for PCP follow-up, and return precautions with the patient. Provided opportunity for questions, patient confirmed understanding and is in agreement with plan.   Final Clinical Impressions(s) / ED Diagnoses   Final diagnoses:  Rash    ED Discharge Orders        Ordered    permethrin (ELIMITE) 5 % cream     10/15/17 [address] [place], PA-C 10/15/17 1625    Rolland Porter, MD 10/21/17 2348

## 2017-10-17 ENCOUNTER — Encounter (HOSPITAL_BASED_OUTPATIENT_CLINIC_OR_DEPARTMENT_OTHER): Payer: Self-pay

## 2017-10-17 ENCOUNTER — Emergency Department (HOSPITAL_BASED_OUTPATIENT_CLINIC_OR_DEPARTMENT_OTHER)
Admission: EM | Admit: 2017-10-17 | Discharge: 2017-10-17 | Disposition: A | Discharge status: Home / Self Care | Payer: Medicaid Other | Attending: Emergency Medicine | Admitting: Emergency Medicine

## 2017-10-17 ENCOUNTER — Other Ambulatory Visit: Payer: Self-pay

## 2017-10-17 DIAGNOSIS — B86 Scabies: Secondary | ICD-10-CM | POA: Diagnosis not present

## 2017-10-17 DIAGNOSIS — R21 Rash and other nonspecific skin eruption: Secondary | ICD-10-CM | POA: Diagnosis present

## 2017-10-17 DIAGNOSIS — Z87891 Personal history of nicotine dependence: Secondary | ICD-10-CM | POA: Diagnosis not present

## 2017-10-17 HISTORY — DX: Other psychoactive substance abuse, uncomplicated: F19.10

## 2017-10-17 MED ORDER — PERMETHRIN 5 % EX CREA: TOPICAL_CREAM | CUTANEOUS | 1 refills | Status: DC

## 2017-10-17 MED ORDER — IVERMECTIN 3 MG PO TABS: 200.0000 ug/kg | ORAL_TABLET | Freq: Once | ORAL | 1 refills | Status: AC

## 2017-10-17 NOTE — Discharge Instructions (Signed)
You were seen in the ED today with scabies. I am giving medication for scabies to apply to the skin and a pill. Take the pill once today and once in a week. Follow up with the PCP in the coming week.

## 2017-10-17 NOTE — ED Triage Notes (Signed)
Pt c/o scattered rash-states she is out of scabies cream-NAD-steady gait

## 2017-10-17 NOTE — ED Provider Notes (Signed)
Emergency Department Provider Note   I have reviewed the triage vital signs and the nursing notes.   HISTORY  Chief Complaint Rash   HPI Cheryl Holt is a 27 y.o. female with recent diagnosis of scabies presents to the ED with continued and worsening symptoms. She was seen recently at an outside ED and prescribed permethrin cream. She applied the cream but did not have enough to cover all lesions. She continues to have itching that is controlled with Atarax. No fever/chills. No surrounding redness. No radiation of symptoms or modifying factors.    Past Medical History:  Diagnosis Date  . Depression   . Gestational diabetes   . Headache   . Hepatitis C 05/11/2016   [ ]  f/u quant [ ]  cmp  . Infection    UTI  . IV drug abuse (HCC)   . Polysubstance abuse [facility](HCC)     Patient Active Problem List   Diagnosis Date Noted  . Pregnancy complicated by subutex maintenance, antepartum (HCC) 09/18/2017  . Dichorionic diamniotic twin pregnancy in third trimester 09/18/2017  . Chronic viral hepatitis affecting pregnancy in third trimester (HCC) 09/18/2017  . Preterm labor 09/16/2017  . Left breast abscess 07/25/2017  . Supervision of high-risk pregnancy 05/24/2016  . Hepatitis C 05/11/2016  . Obesity in pregnancy 05/10/2016  . Rh negative status during pregnancy 04/04/2016  . History of gestational diabetes mellitus 03/27/2016  . Substance abuse complicating pregnancy in third trimester, antepartum 02/26/2016  . Anxiety and depression 02/26/2016  . Cannabis use disorder, moderate, dependence (HCC) 01/18/2016  . 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 Tobacco use disorder 01/18/2016  . Opioid use disorder, severe, in sustained remission (HCC) 01/18/2016  . 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 CESAREAN SECTION    . CHOLECYSTECTOMY,  LAPAROSCOPIC  2014    Current Outpatient Rx  . Order #: [id number] Class: Historical Med  . Order #: [id number] Class: Historical Med  . Order #: [id number] Class: Normal  . Order #: [id number] Class: Normal  . Order #: [id number] Class: Normal  . Order #: [id number] Class: Print  . Order #: [id number] Class: Print  . Order #: [id number] Class: Historical Med    Allergies Patient has no known allergies.  Family History  Problem Relation Age of Onset  . Cancer Mother        pancreatic  . Migraines Mother   . Mental illness Father   . Epilepsy Father   . Migraines Father   . Hypertension Father     Social History Social History   Tobacco Use  . Smoking status: Former Smoker    Packs/day: 0.50    Years: 10.00    Pack years: 5.00    Types: Cigarettes  . Smokeless tobacco: Never Used  Substance Use Topics  . Alcohol use: No  . Drug use: No    Types: Marijuana    Comment: not since pregnancy    Review of Systems  Constitutional: No fever/chills Eyes: No visual changes. ENT: No sore throat. Cardiovascular: Denies chest pain. Respiratory: Denies shortness of breath. Gastrointestinal: No abdominal pain.   No constipation. Skin: Positive diffuse itchy rash.   10-point ROS otherwise negative.  ____________________________________________   PHYSICAL EXAM:  VITAL SIGNS: ED Triage Vitals  Enc Vitals Group     BP 10/17/17 2248 120/74     Pulse Rate 10/17/17 2248 94     Resp 10/17/17 2248  20     Temp 10/17/17 2248 98.2 F (36.8 C)     Temp Source 10/17/17 2248 Oral     SpO2 10/17/17 2248 100 %     Weight 10/17/17 2253 230 lb (104.3 kg)     Height 10/17/17 2253 5\' 4"  (1.626 m)     Pain Score 10/17/17 2247 0   Constitutional: Alert and oriented. Well appearing and in no acute distress. Eyes: Conjunctivae are normal.  Head: Atraumatic. Nose: No congestion/rhinnorhea. Mouth/Throat: Mucous membranes are moist.  Neck: No stridor. Respiratory: Normal respiratory effort.    Gastrointestinal: No distention.  Musculoskeletal: No gross deformities of extremities. Neurologic:  Normal speech and language. Skin:  Skin is warm with diffuse rash with mild erythema. No petechiae. Lesions covering the hands (webspaces), arms, and face.   ____________________________________________   PROCEDURES  Procedure(s) performed:   Procedures  None ____________________________________________   INITIAL IMPRESSION / ASSESSMENT AND PLAN / ED COURSE  Pertinent labs & imaging results that were available during my care of the patient were reviewed by me and considered in my medical decision making (see chart for details).  Patient presents to the ED with worsening itchy rash. Given extent of disease and concern for scabies I plan to continue the permethrin cream as before but will add ivermectin dosing now and in 1 week. Patient to follow up with PCP.   At this time, I do not feel there is any life-threatening condition present. I have reviewed and discussed all results (EKG, imaging, lab, urine as appropriate), exam findings with patient. I have reviewed nursing notes and appropriate previous records.  I feel the patient is safe to be discharged home without further emergent workup. Discussed usual and customary return precautions. Patient and family (if present) verbalize understanding and are comfortable with this plan.  Patient will follow-up with their primary care provider. If they do not have a primary care provider, information for follow-up has been provided to them. All questions have been answered.  ____________________________________________  FINAL CLINICAL IMPRESSION(S) / ED DIAGNOSES  Final diagnoses:  Scabies    NEW OUTPATIENT MEDICATIONS STARTED DURING THIS VISIT:  Discharge Medication List as of 10/17/2017 11:34 PM    START taking these medications   Details  ivermectin (STROMECTOL) 3 MG TABS tablet Take 7 tablets (21,000 mcg total) by mouth once for 1  dose., Starting Thu 10/17/2017, Print      Permethrin cream refill  Note:  This document was prepared using Dragon voice recognition software and may include unintentional dictation errors.  Alona Bene, MD Emergency Medicine    Pete Merten, Arlyss Repress, MD 10/18/17 1055

## 2017-11-04 ENCOUNTER — Ambulatory Visit: Payer: Self-pay | Admitting: Obstetrics and Gynecology

## 2017-11-06 ENCOUNTER — Emergency Department (HOSPITAL_BASED_OUTPATIENT_CLINIC_OR_DEPARTMENT_OTHER)
Admission: EM | Admit: 2017-11-06 | Discharge: 2017-11-06 | Disposition: A | Discharge status: Home / Self Care | Payer: Medicaid Other | Attending: Emergency Medicine | Admitting: Emergency Medicine

## 2017-11-06 ENCOUNTER — Encounter (HOSPITAL_BASED_OUTPATIENT_CLINIC_OR_DEPARTMENT_OTHER): Payer: Self-pay | Admitting: Emergency Medicine

## 2017-11-06 ENCOUNTER — Other Ambulatory Visit: Payer: Self-pay

## 2017-11-06 DIAGNOSIS — F1210 Cannabis abuse, uncomplicated: Secondary | ICD-10-CM | POA: Diagnosis not present

## 2017-11-06 DIAGNOSIS — L299 Pruritus, unspecified: Secondary | ICD-10-CM | POA: Insufficient documentation

## 2017-11-06 DIAGNOSIS — Z87891 Personal history of nicotine dependence: Secondary | ICD-10-CM | POA: Insufficient documentation

## 2017-11-06 DIAGNOSIS — B86 Scabies: Secondary | ICD-10-CM | POA: Insufficient documentation

## 2017-11-06 DIAGNOSIS — R21 Rash and other nonspecific skin eruption: Secondary | ICD-10-CM | POA: Diagnosis present

## 2017-11-06 MED ORDER — PERMETHRIN 5 % EX CREA: TOPICAL_CREAM | CUTANEOUS | 0 refills | Status: DC

## 2017-11-06 NOTE — ED Triage Notes (Signed)
Patient states that the scabies has come back.

## 2017-11-06 NOTE — ED Provider Notes (Signed)
[facility] EMERGENCY DEPARTMENT Provider Note   CSN: [id number] Arrival date & time: 11/06/17  0423     History   Chief Complaint Chief Complaint  Patient presents with  . Rash    HPI Cheryl Holt is a 27 y.o. female.   Rash   This is a recurrent problem. The current episode started yesterday. The problem has not changed since onset.Associated with: scabies contact, significant other and they had people at their house that had it. There has been no fever. The rash is present on the left foot, left ankle, left fingers, right hand, right ankle and right foot. The patient is experiencing no pain. Associated symptoms include itching. She has tried nothing for the symptoms. The treatment provided no relief. Risk factors: scabies exposure.    Past Medical History:  Diagnosis Date  . Depression   . Gestational diabetes   . Headache   . Hepatitis C 05/11/2016   [ ]  f/u quant [ ]  cmp  . Infection    UTI  . IV drug abuse (HCC)   . Polysubstance abuse [facility])     Patient Active Problem List   Diagnosis Date Noted  . Pregnancy complicated by subutex maintenance, antepartum (HCC) 09/18/2017  . Dichorionic diamniotic twin pregnancy in third trimester 09/18/2017  . Chronic viral hepatitis affecting pregnancy in third trimester (HCC) 09/18/2017  . Preterm labor 09/16/2017  . Left breast abscess 07/25/2017  . Supervision of high-risk pregnancy 05/24/2016  . Hepatitis C 05/11/2016  . Obesity in pregnancy 05/10/2016  . Rh negative status during pregnancy 04/04/2016  . History of gestational diabetes mellitus 03/27/2016  . Substance abuse complicating pregnancy in third trimester, antepartum 02/26/2016  . Anxiety and depression 02/26/2016  . Cannabis use disorder, moderate, dependence (HCC) 01/18/2016  . 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 Tobacco use disorder 01/18/2016  . Opioid use disorder, severe, in sustained remission (HCC)  01/18/2016  . 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 CESAREAN SECTION    . CHOLECYSTECTOMY, LAPAROSCOPIC  2014     OB History    Gravida  5   Para  2   Term  2   Preterm  0   AB  2   Living  2     SAB  0   TAB  2   Ectopic      Multiple      Live Births  1            Home Medications    Prior to Admission medications   Medication Sig Start Date End Date Taking? Authorizing Provider  buprenorphine (SUBUTEX) 8 MG SUBL SL tablet TK 1 T PO Q 8 H 08/29/17   [provider]  calcium carbonate (TUMS - DOSED IN MG ELEMENTAL CALCIUM) 500 MG chewable tablet Chew 1 tablet by mouth 2 (two) times daily as needed for indigestion or heartburn.    [provider]  hydrOXYzine (ATARAX/VISTARIL) 25 MG tablet Take 1 tablet (25 mg total) by mouth at bedtime as needed for itching. 09/16/17   Hermina Staggers, MD  metroNIDAZOLE (FLAGYL) 500 MG tablet Take 1 tablet (500 mg total) by mouth 2 (two) times daily. 09/20/17   Hermina Staggers, MD  NIFEdipine (PROCARDIA XL) 30 MG 24 hr tablet Take 1 tablet (30 mg total) by mouth 2 (two) times daily as needed (contractions). 09/25/17  Judeth Horn, NP  permethrin [place]) 5 % cream Apply to affected area once 11/06/17   Bobby Barton, MD  Prenatal Vit-Fe Fumarate-FA (PRENATAL MULTIVITAMIN) TABS tablet Take 1 tablet by mouth daily at 12 noon. 05/20/17   Devoria Albe, MD  TraZODone & Diet Manage Prod (TRAZAMINE PO) Take by mouth.    [provider]    Family History Family History  Problem Relation Age of Onset  . Cancer Mother        pancreatic  . Migraines Mother   . Mental illness Father   . Epilepsy Father   . Migraines Father   . Hypertension Father     Social History Social History   Tobacco Use  . Smoking status: Former Smoker    Packs/day: 0.50    Years: 10.00    Pack years: 5.00    Types:  Cigarettes  . Smokeless tobacco: Never Used  Substance Use Topics  . Alcohol use: No  . Drug use: No    Types: Marijuana    Comment: not since pregnancy     Allergies   Patient has no known allergies.   Review of Systems Review of Systems  Constitutional: Negative for diaphoresis and fever.  HENT: Negative for congestion and drooling.   Eyes: Negative for photophobia.  Respiratory: Negative for shortness of breath.   Cardiovascular: Negative for chest pain.  Gastrointestinal: Negative for abdominal pain and vomiting.  Musculoskeletal: Negative for back pain, neck pain and neck stiffness.  Skin: Positive for itching and rash.  All other systems reviewed and are negative.    Physical Exam Updated Vital Signs BP (!) 153/100 (BP Location: Left Arm)   Pulse (!) 110   Temp 98.3 F (36.8 C) (Oral)   Resp 18   Ht 5\' 5"  (1.651 m)   Wt 104.3 kg (230 lb)   LMP 04/22/2017 (Exact Date)   SpO2 100%   BMI 38.27 kg/m   Physical Exam  Constitutional: She is oriented to person, place, and time. She appears well-developed and well-nourished. No distress.  HENT:  Head: Normocephalic and atraumatic.  Mouth/Throat: No oropharyngeal exudate.  Eyes: Pupils are equal, round, and reactive to light. Conjunctivae are normal.  Neck: Normal range of motion. Neck supple.  Cardiovascular: Normal rate, regular rhythm, normal heart sounds and intact distal pulses.  Pulmonary/Chest: Effort normal and breath sounds normal. No stridor. She has no wheezes. She has no rales.  Abdominal: Soft. Bowel sounds are normal. She exhibits no mass. There is no tenderness. There is no rebound and no guarding.  Musculoskeletal: Normal range of motion.  Neurological: She is alert and oriented to person, place, and time. She displays normal reflexes.  Skin: Skin is warm and dry. Capillary refill takes less than 2 seconds.  Lesions cw scabies burrows on wrists and between fingers  Psychiatric: She has a normal  mood and affect.      Final Clinical Impressions(s) / ED Diagnoses   Final diagnoses:  Scabies   Return for weakness, numbness, changes in vision or speech, fevers >100.4 unrelieved by medication, shortness of breath, intractable vomiting, or diarrhea, abdominal pain, Inability to tolerate liquids or food, cough, altered mental status or any concerns. No signs of systemic illness or infection. The patient is nontoxic-appearing on exam and vital signs are within normal limits.   I have reviewed the triage vital signs and the nursing notes. Pertinent labs &imaging results that were available during my care of the patient were reviewed by me and  considered in my medical decision making (see chart for details).  After history, exam, and medical workup I feel the patient has been appropriately medically screened and is safe for discharge home. Pertinent diagnoses were discussed with the patient. Patient was given return precautions. ED Discharge Orders        Ordered    permethrin (ELIMITE) 5 % cream     11/06/17 0447       Baldwin Racicot, MD 11/06/17 [phone]

## 2017-11-13 ENCOUNTER — Encounter: Payer: Self-pay | Admitting: *Deleted

## 2017-12-03 ENCOUNTER — Emergency Department (HOSPITAL_BASED_OUTPATIENT_CLINIC_OR_DEPARTMENT_OTHER)
Admission: EM | Admit: 2017-12-03 | Discharge: 2017-12-03 | Disposition: A | Discharge status: Home / Self Care | Payer: Medicaid Other | Attending: Emergency Medicine | Admitting: Emergency Medicine

## 2017-12-03 ENCOUNTER — Encounter (HOSPITAL_BASED_OUTPATIENT_CLINIC_OR_DEPARTMENT_OTHER): Payer: Self-pay | Admitting: Emergency Medicine

## 2017-12-03 ENCOUNTER — Other Ambulatory Visit: Payer: Self-pay

## 2017-12-03 DIAGNOSIS — Z5321 Procedure and treatment not carried out due to patient leaving prior to being seen by health care provider: Secondary | ICD-10-CM | POA: Diagnosis not present

## 2017-12-03 DIAGNOSIS — R21 Rash and other nonspecific skin eruption: Secondary | ICD-10-CM | POA: Diagnosis present

## 2017-12-03 DIAGNOSIS — B86 Scabies: Secondary | ICD-10-CM | POA: Insufficient documentation

## 2017-12-03 MED ORDER — PERMETHRIN 5 % EX CREA: TOPICAL_CREAM | CUTANEOUS | 0 refills | Status: DC

## 2017-12-03 NOTE — ED Triage Notes (Signed)
Pt has rash with hx of scabies

## 2017-12-03 NOTE — ED Notes (Signed)
Pt not in room when MD went to assess pt.  

## 2017-12-05 ENCOUNTER — Other Ambulatory Visit: Payer: Self-pay

## 2017-12-05 ENCOUNTER — Encounter (HOSPITAL_BASED_OUTPATIENT_CLINIC_OR_DEPARTMENT_OTHER): Payer: Self-pay | Admitting: *Deleted

## 2017-12-05 ENCOUNTER — Emergency Department (HOSPITAL_BASED_OUTPATIENT_CLINIC_OR_DEPARTMENT_OTHER)
Admission: EM | Admit: 2017-12-05 | Discharge: 2017-12-05 | Disposition: A | Discharge status: Home / Self Care | Payer: Medicaid Other | Attending: Emergency Medicine | Admitting: Emergency Medicine

## 2017-12-05 DIAGNOSIS — Z87891 Personal history of nicotine dependence: Secondary | ICD-10-CM | POA: Diagnosis not present

## 2017-12-05 DIAGNOSIS — B86 Scabies: Secondary | ICD-10-CM | POA: Diagnosis not present

## 2017-12-05 DIAGNOSIS — Z79899 Other long term (current) drug therapy: Secondary | ICD-10-CM | POA: Insufficient documentation

## 2017-12-05 DIAGNOSIS — L298 Other pruritus: Secondary | ICD-10-CM | POA: Diagnosis present

## 2017-12-05 MED ORDER — PERMETHRIN 5 % EX CREA: TOPICAL_CREAM | CUTANEOUS | 2 refills | Status: DC

## 2017-12-05 MED ORDER — IVERMECTIN 3 MG PO TABS: 150.0000 ug/kg | ORAL_TABLET | Freq: Once | ORAL | 0 refills | Status: AC

## 2017-12-05 NOTE — Discharge Instructions (Signed)
Permethrin cream as prescribed. ° °Follow-up with a dermatologist if symptoms persist. °

## 2017-12-05 NOTE — ED Triage Notes (Signed)
C/o itching from scabies 

## 2017-12-05 NOTE — ED Provider Notes (Signed)
[facility] EMERGENCY DEPARTMENT Provider Note   CSN: [id number] Arrival date & time: 12/05/17  0541     History   Chief Complaint Chief Complaint  Patient presents with  . Pruritis    HPI Cheryl Holt is a 27 y.o. female.  Patient is a 27 year old female presenting with complaints of itching.  She has been diagnosed in the past with scabies and treated with permethrin.  This helped for short period of time, however symptoms have since returned.  She is here with her significant other who has similar symptoms.  The history is provided by the patient.    Past Medical History:  Diagnosis Date  . Depression   . Gestational diabetes   . Headache   . Hepatitis C 05/11/2016    f/u quant  cmp  . Infection    UTI  . IV drug abuse (HCC)   . Polysubstance abuse [facility])     Patient Active Problem List   Diagnosis Date Noted  . Pregnancy complicated by subutex maintenance, antepartum (HCC) 09/18/2017  . Dichorionic diamniotic twin pregnancy in third trimester 09/18/2017  . Chronic viral hepatitis affecting pregnancy in third trimester (HCC) 09/18/2017  . Preterm labor 09/16/2017  . Left breast abscess 07/25/2017  . Supervision of high-risk pregnancy 05/24/2016  . Hepatitis C 05/11/2016  . Obesity in pregnancy 05/10/2016  . Rh negative status during pregnancy 04/04/2016  . History of gestational diabetes mellitus 03/27/2016  . Substance abuse complicating pregnancy in third trimester, antepartum 02/26/2016  . Anxiety and depression 02/26/2016  . Cannabis use disorder, moderate, dependence (HCC) 01/18/2016  . 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 Tobacco use disorder 01/18/2016  . Opioid use disorder, severe, in sustained remission (HCC) 01/18/2016  . 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 CESAREAN SECTION    .  CHOLECYSTECTOMY, LAPAROSCOPIC  2014     OB History    Gravida  5   Para  2   Term  2   Preterm  0   AB  2   Living  2     SAB  0   TAB  2   Ectopic      Multiple      Live Births  1            Home Medications    Prior to Admission medications   Medication Sig Start Date End Date Taking? Authorizing Provider  buprenorphine (SUBUTEX) 8 MG SUBL SL tablet TK 1 T PO Q 8 H 08/29/17   [provider]  calcium carbonate (TUMS - DOSED IN MG ELEMENTAL CALCIUM) 500 MG chewable tablet Chew 1 tablet by mouth 2 (two) times daily as needed for indigestion or heartburn.    [provider]  hydrOXYzine (ATARAX/VISTARIL) 25 MG tablet Take 1 tablet (25 mg total) by mouth at bedtime as needed for itching. 09/16/17   Hermina Staggers, MD  metroNIDAZOLE (FLAGYL) 500 MG tablet Take 1 tablet (500 mg total) by mouth 2 (two) times daily. 09/20/17   Hermina Staggers, MD  NIFEdipine (PROCARDIA XL) 30 MG 24 hr tablet Take 1 tablet (30 mg total) by mouth 2 (two) times daily as needed (contractions). 09/25/17   Judeth Horn, NP  permethrin Verner Mould) 5 % cream Apply to affected area once 12/03/17   Palumbo, April, MD  Prenatal Vit-Fe Fumarate-FA (PRENATAL MULTIVITAMIN) TABS  tablet Take 1 tablet by mouth daily at 12 noon. 05/20/17   Devoria Albe, MD  TraZODone & Diet Manage Prod (TRAZAMINE PO) Take by mouth.    [provider]    Family History Family History  Problem Relation Age of Onset  . Cancer Mother        pancreatic  . Migraines Mother   . Mental illness Father   . Epilepsy Father   . Migraines Father   . Hypertension Father     Social History Social History   Tobacco Use  . Smoking status: Former Smoker    Packs/day: 0.50    Years: 10.00    Pack years: 5.00    Types: Cigarettes  . Smokeless tobacco: Never Used  Substance Use Topics  . Alcohol use: No  . Drug use: No    Types: Marijuana    Comment: not since pregnancy     Allergies   Patient  has no known allergies.   Review of Systems Review of Systems  All other systems reviewed and are negative.    Physical Exam Updated Vital Signs BP 140/88   Pulse 85   Temp 98.5 F (36.9 C) (Oral)   Resp 16   LMP 12/01/2017   SpO2 98%   Physical Exam  Constitutional: She is oriented to person, place, and time. She appears well-developed and well-nourished. No distress.  HENT:  Head: Normocephalic and atraumatic.  Neck: Normal range of motion. Neck supple.  Pulmonary/Chest: Effort normal.  Musculoskeletal: Normal range of motion.  Neurological: She is alert and oriented to person, place, and time.  Skin: Skin is warm and dry. She is not diaphoretic.  There are burrowing lesions noted between the fingers and toes and extending into the forearms and legs.  Nursing note and vitals reviewed.    ED Treatments / Results  Labs (all labs ordered are listed, but only abnormal results are displayed) Labs Reviewed - No data to display  EKG None  Radiology No results found.  Procedures Procedures (including critical care time)  Medications Ordered in ED Medications - No data to display   Initial Impression / Assessment and Plan / ED Course  I have reviewed the triage vital signs and the nursing notes.  Pertinent labs & imaging results that were available during my care of the patient were reviewed by me and considered in my medical decision making (see chart for details).  Appears to be scabies.  Will treat with permethrin cream.  Final Clinical Impressions(s) / ED Diagnoses   Final diagnoses:  None    ED Discharge Orders    None       Geoffery Lyons, MD 12/05/17 [phone]

## 2017-12-10 IMAGING — US US MFM OB LIMITED
1 series · 15 of 28 positions shown · non-contrast
Comparison: none

[Series 1: us mfm ob limited · 15 of 43 slices shown]
[im 1/43]
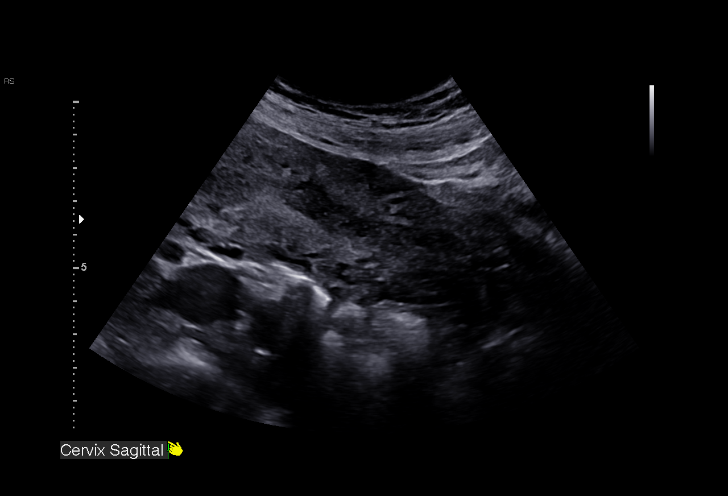
[im 4/43]
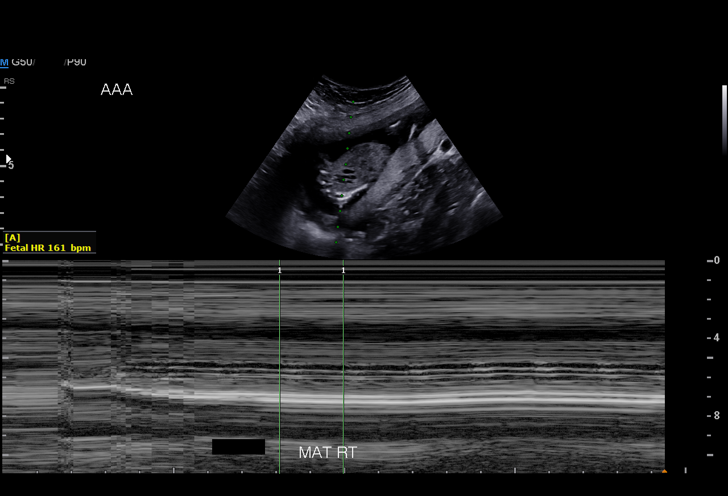
[im 7/43]
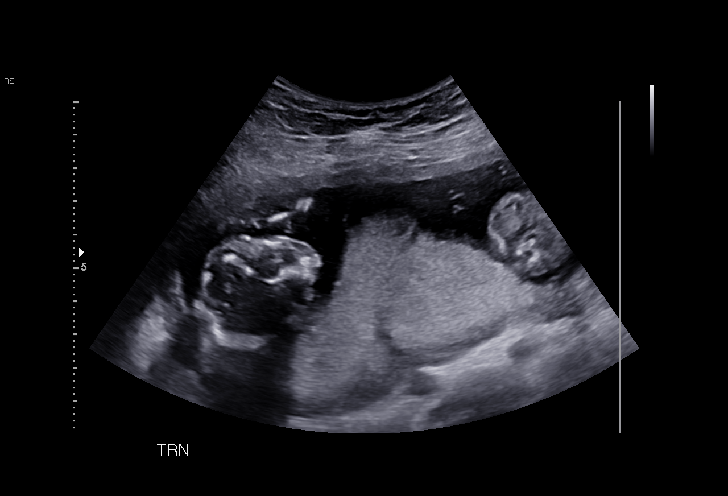
[im 10/43]
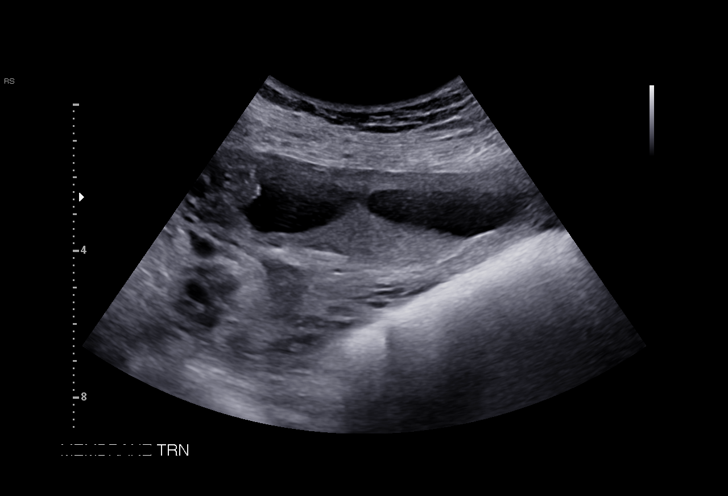
[im 13/43]
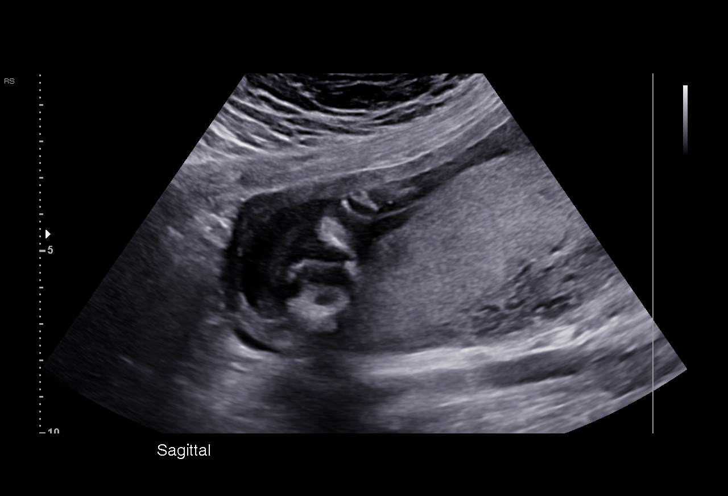
[im 16/43]
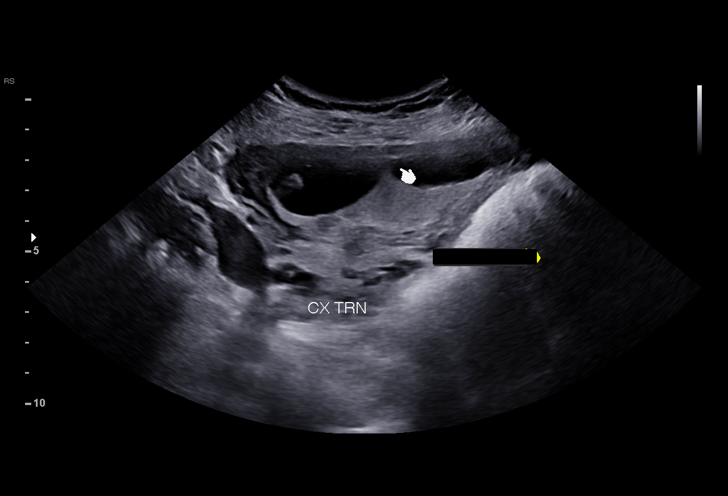
[im 19/43]
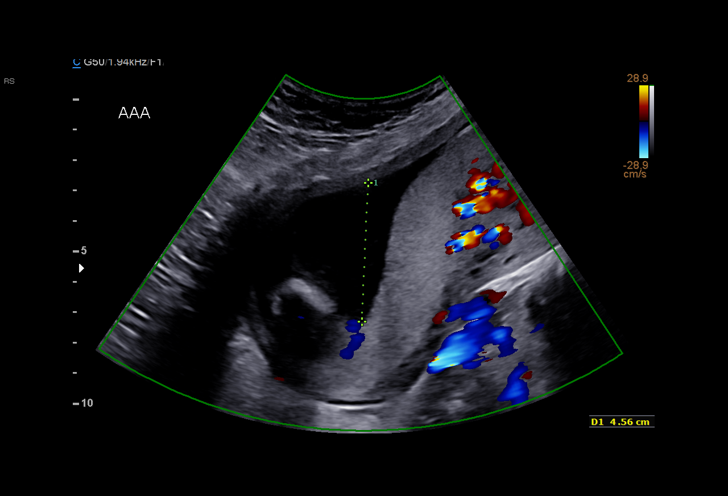
[im 22/43]
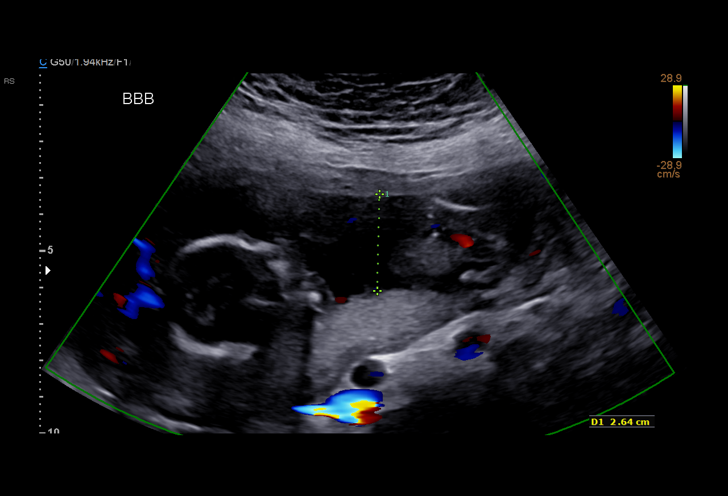
[im 24/43]
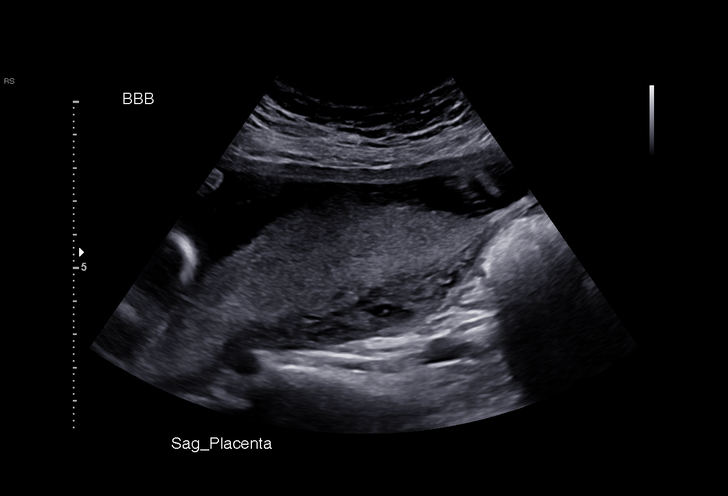
[im 27/43]
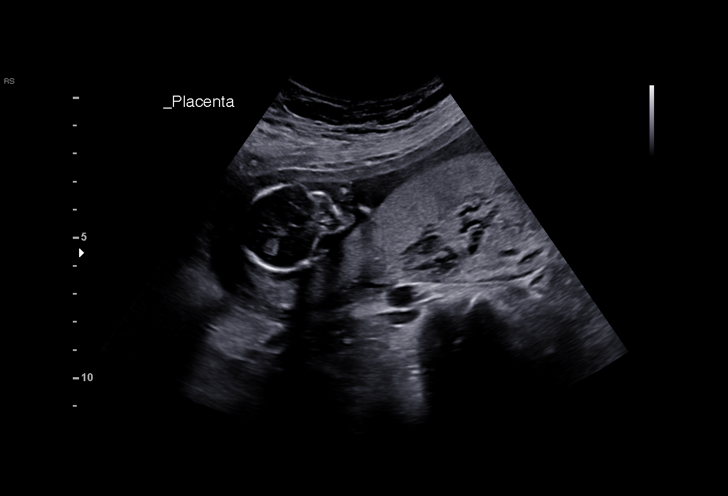
[im 30/43]
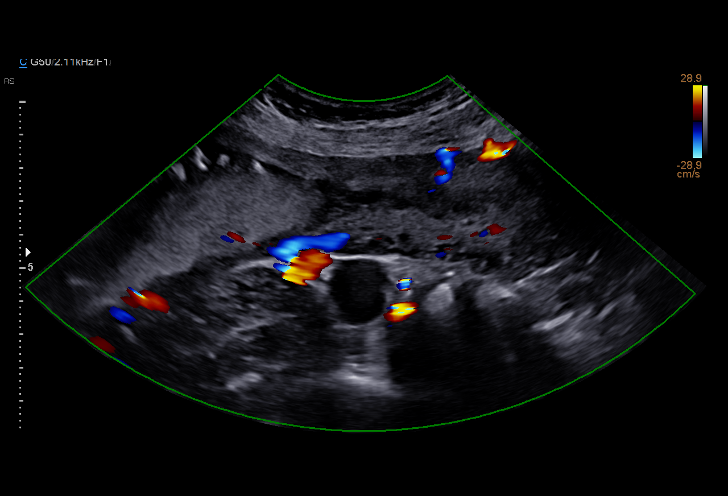
[im 33/43]
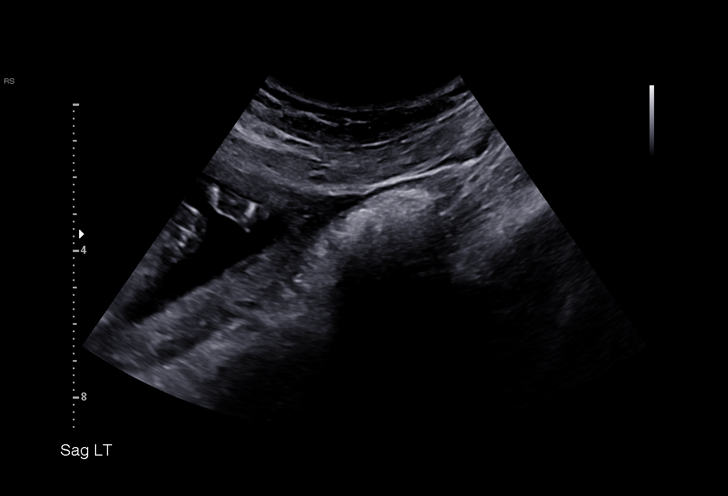
[im 36/43]
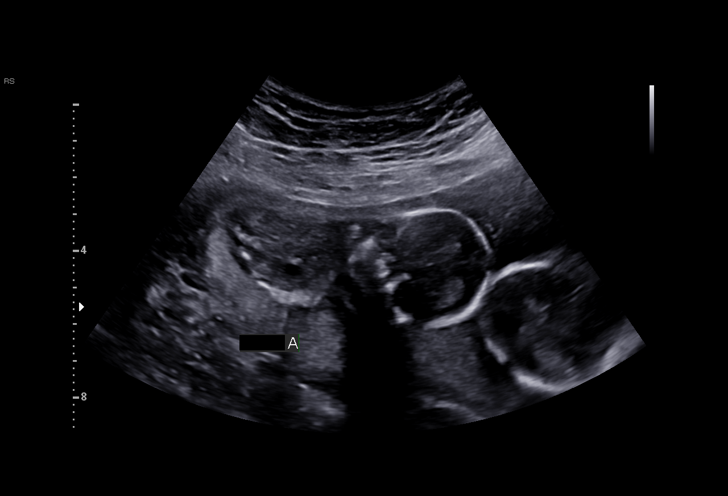
[im 39/43]
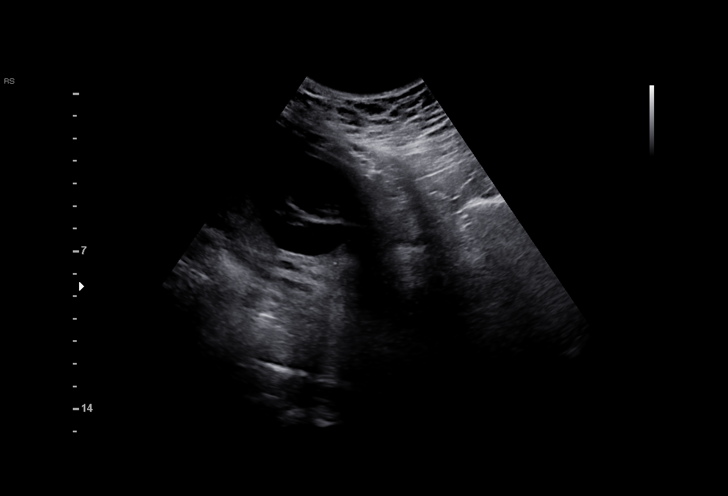
[im 43/43]
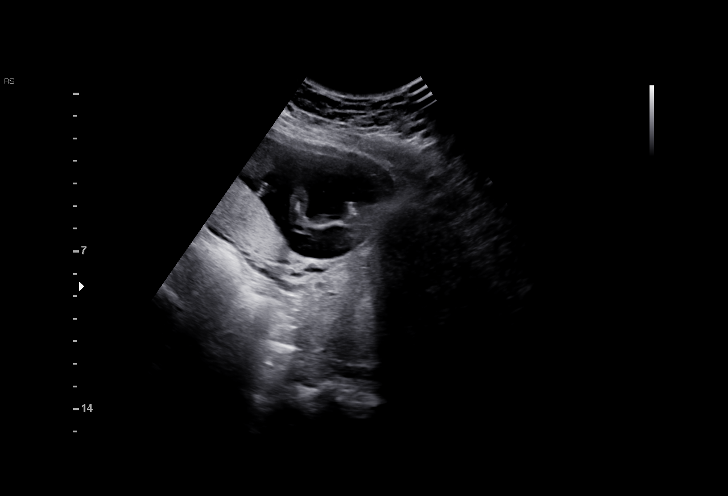

[15 of 28 positions shown; findings below may reference images not displayed]

MAU/Triage

1  PANCHA KASSAM             [id number]      [id number]     [id number]
Indications

15 weeks gestation of pregnancy
Vaginal bleeding in pregnancy, second
trimester
Abdominal pain in pregnancy
Twin pregnancy, di/di, second trimester
OB History

Gravidity:    3         Term:   2
Living:       2
Fetal Evaluation (Fetus A)

Num Of Fetuses:     2
Fetal Heart         161
Rate(bpm):
Cardiac Activity:   Observed
Fetal Lie:          Maternal right side
Presentation:       Breech
Placenta:           Posterior, low-lying
Membrane Desc:      Dividing Membrane seen - Dichorionic.

Amniotic Fluid
AFI FV:      Subjectively within normal limits

Largest Pocket(cm)
4.6
Gestational Age (Fetus A)

LMP:           17w 2d        Date:  01/27/17                 EDD:   11/03/17
Best:          15w 5d     Det. By:  Early Ultrasound         EDD:   11/14/17
(04/12/17)
Fetal Evaluation (Fetus B)

Num Of Fetuses:     2
Fetal Heart         167
Rate(bpm):
Cardiac Activity:   Observed
Fetal Lie:          Maternal left side
Presentation:       Breech
Placenta:           Posterior
Membrane Desc:      Dividing Membrane seen - Dichorionic.

Amniotic Fluid
AFI FV:      Subjectively within normal limits

Largest Pocket(cm)
2.6
Gestational Age (Fetus B)

LMP:           17w 2d        Date:  01/27/17                 EDD:   11/03/17
Best:          15w 5d     Det. By:  Early Ultrasound         EDD:   11/14/17
(04/12/17)
Cervix Uterus Adnexa

Cervix
Length:           4.26  cm.
Normal appearance by transabdominal scan.

Uterus
No abnormality visualized.

Left Ovary
Not visualized.

Right Ovary
Not visualized.

Adnexa:       No abnormality visualized. No adnexal mass
visualized.
Impression

Dichorionic/diamniotic twin pregnancy at 15+5 weeks
Normal amniotic fluid volume x 2
Twin A: low lying posterior placenta; no subchorionic fluid
collections/hemorrhage identified
Recommendations

Follow-up ultrasound in 3 weeks for detailed anatomic surveys

## 2018-01-15 IMAGING — US US RENAL
1 series · 15 of 25 positions shown · non-contrast
Comparison: 08/06/2016

CLINICAL DATA: Low back pain for several hours, 20 weeks pregnant

EXAM:
RENAL / URINARY TRACT ULTRASOUND COMPLETE

[Series 1: us renal · 15 of 36 slices shown]
[im 1/36]
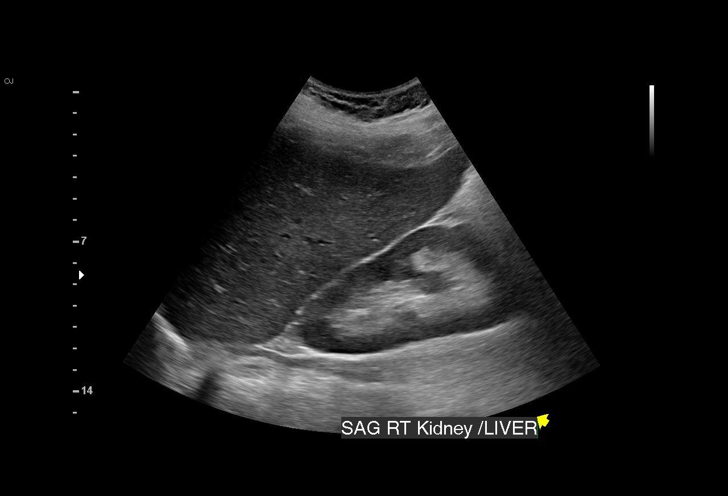
[im 3/36]
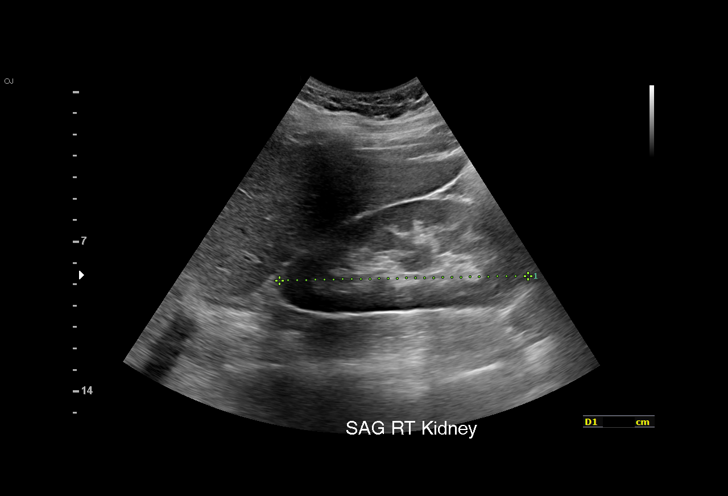
[im 6/36]
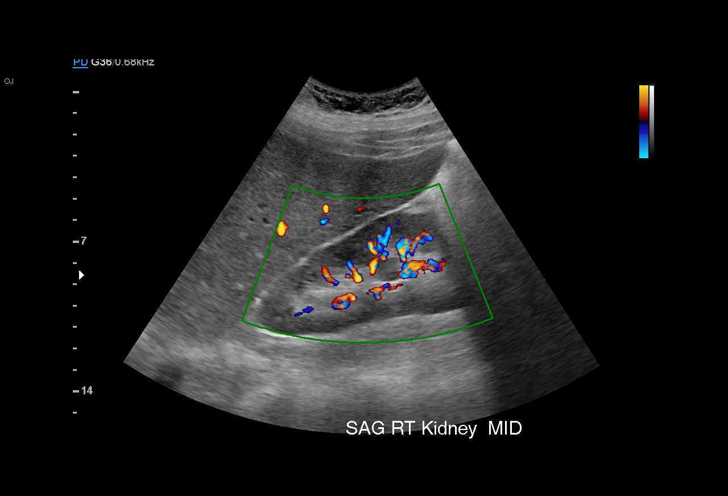
[im 8/36]
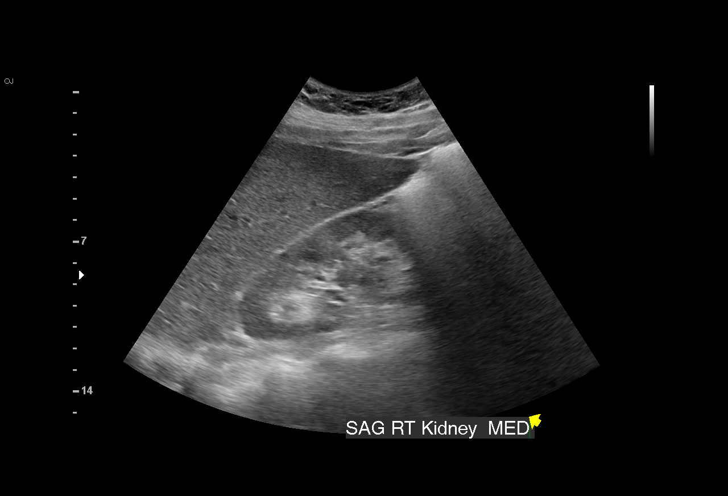
[im 11/36]
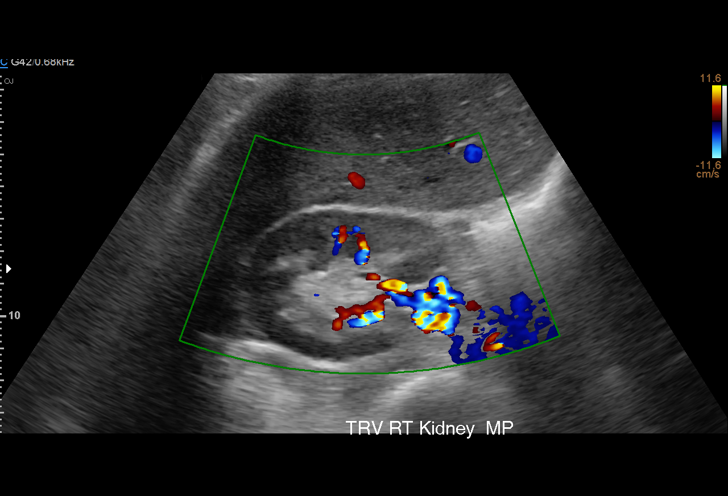
[im 14/36]
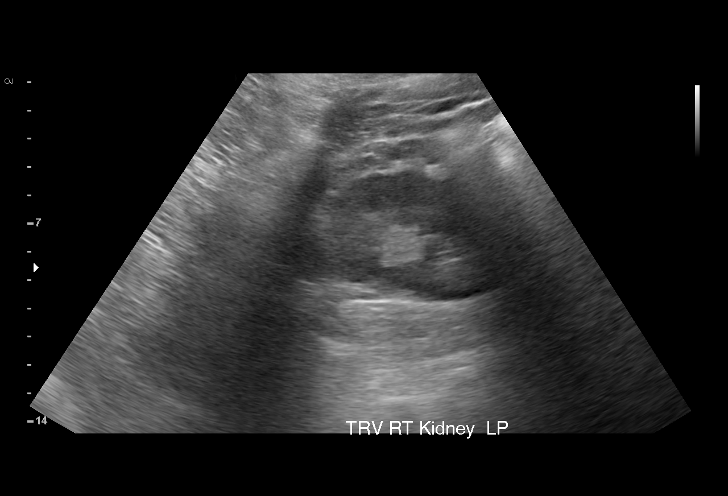
[im 15/36]
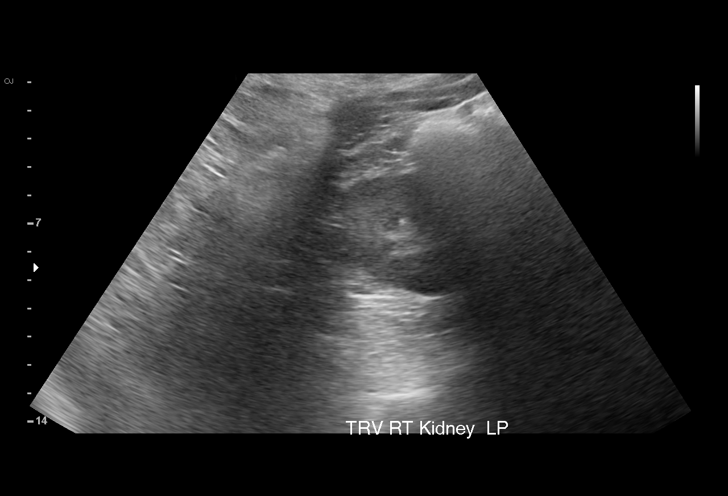
[im 18/36]
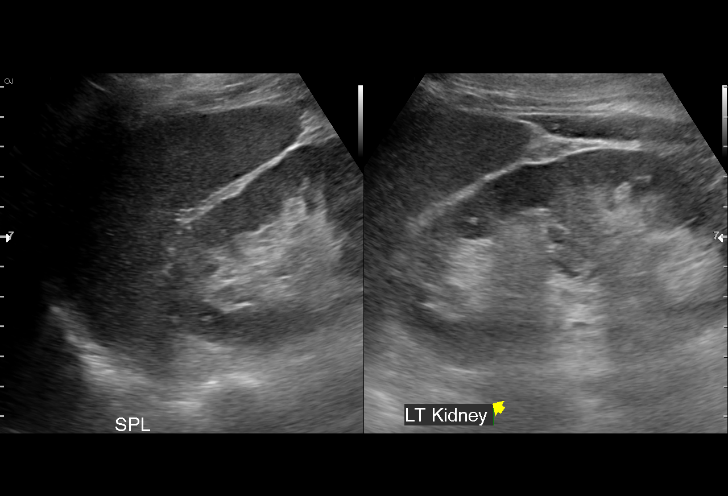
[im 21/36]
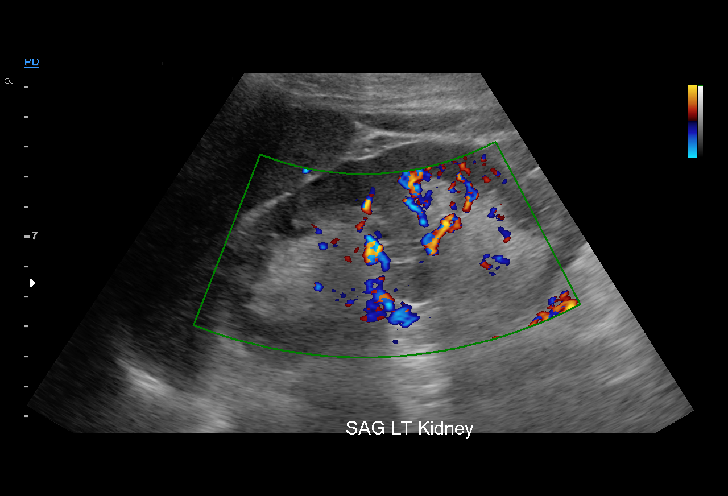
[im 22/36]
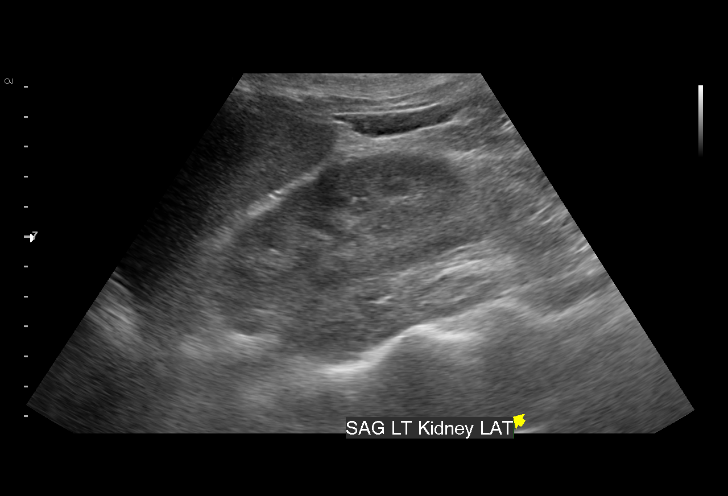
[im 25/36]
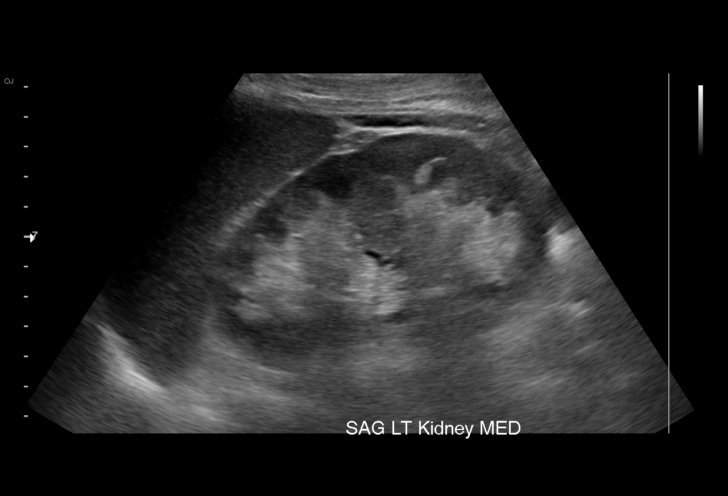
[im 28/36]
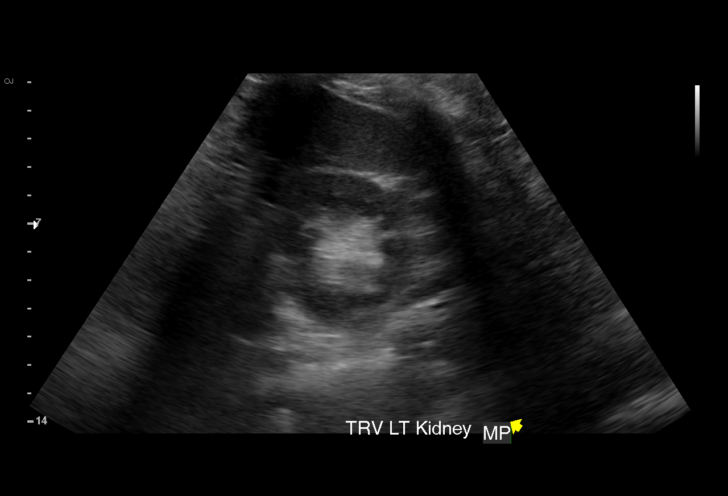
[im 30/36]
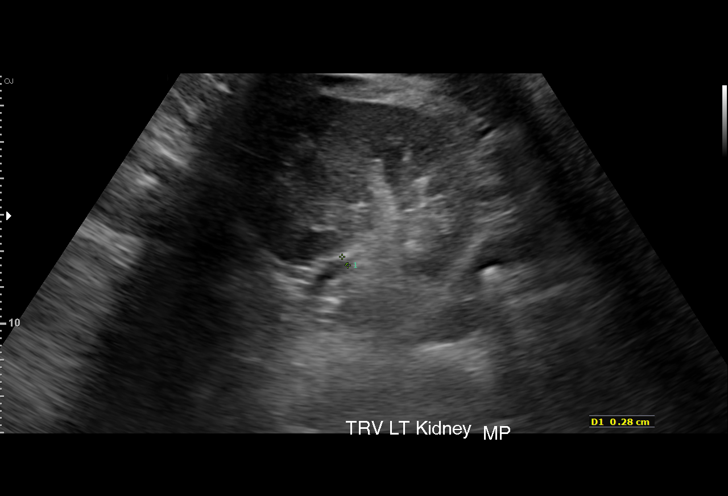
[im 33/36]
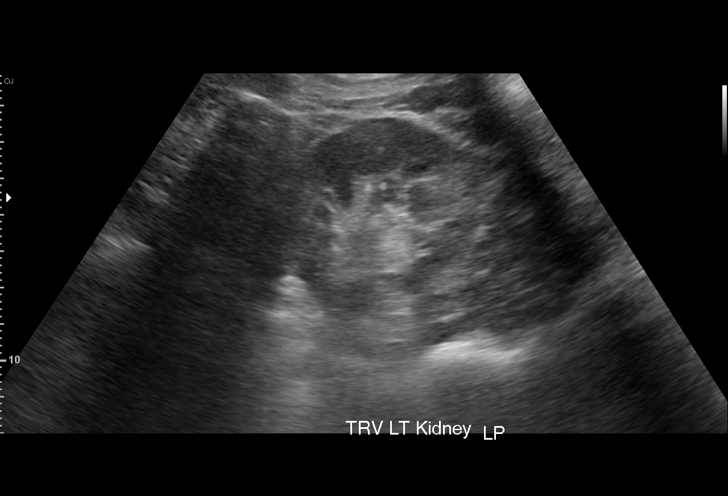
[im 36/36]
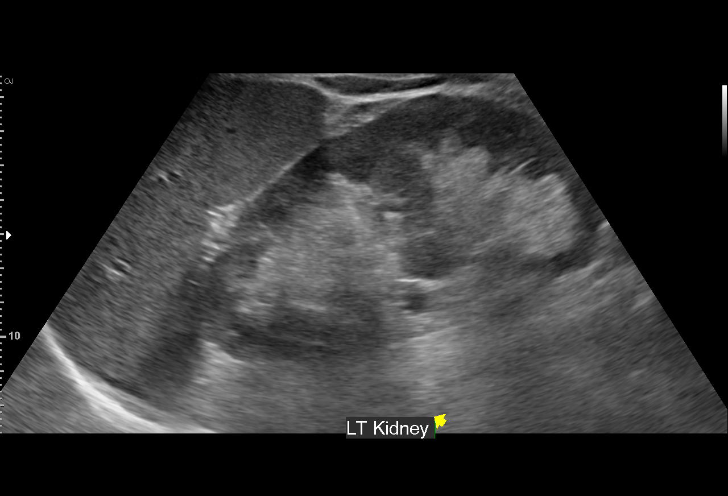

[15 of 25 positions shown; findings below may reference images not displayed]

FINDINGS: Right Kidney:

Length: 11.6 cm.. Echogenicity within normal limits. No mass or
hydronephrosis visualized.

Left Kidney:

Length: 12.4 cm.. Echogenicity within normal limits. No mass or
hydronephrosis visualized.

Bladder:

Not well visualized
IMPRESSION: Normal appearing kidneys without evidence of hydronephrosis.
Previously seen renal stones are not well appreciated on this exam.

## 2018-07-09 ENCOUNTER — Other Ambulatory Visit: Payer: Self-pay | Admitting: Registered Nurse

## 2018-07-09 ENCOUNTER — Inpatient Hospital Stay (HOSPITAL_COMMUNITY)
Admission: AD | Admit: 2018-07-09 | Discharge: 2018-07-15 | DRG: 885 | Disposition: A | Discharge status: Home / Self Care | Payer: Federal, State, Local not specified - Other | Source: Intra-hospital | Attending: Psychiatry | Admitting: Psychiatry

## 2018-07-09 ENCOUNTER — Other Ambulatory Visit: Payer: Self-pay

## 2018-07-09 ENCOUNTER — Encounter (HOSPITAL_COMMUNITY): Payer: Self-pay | Admitting: Behavioral Health

## 2018-07-09 DIAGNOSIS — F332 Major depressive disorder, recurrent severe without psychotic features: Secondary | ICD-10-CM | POA: Diagnosis not present

## 2018-07-09 DIAGNOSIS — Z818 Family history of other mental and behavioral disorders: Secondary | ICD-10-CM

## 2018-07-09 DIAGNOSIS — F410 Panic disorder [episodic paroxysmal anxiety] without agoraphobia: Secondary | ICD-10-CM | POA: Diagnosis present

## 2018-07-09 DIAGNOSIS — F1210 Cannabis abuse, uncomplicated: Secondary | ICD-10-CM | POA: Diagnosis present

## 2018-07-09 DIAGNOSIS — Z590 Homelessness: Secondary | ICD-10-CM

## 2018-07-09 DIAGNOSIS — F1414 Cocaine abuse with cocaine-induced mood disorder: Secondary | ICD-10-CM | POA: Diagnosis present

## 2018-07-09 DIAGNOSIS — N390 Urinary tract infection, site not specified: Secondary | ICD-10-CM | POA: Diagnosis present

## 2018-07-09 DIAGNOSIS — F17210 Nicotine dependence, cigarettes, uncomplicated: Secondary | ICD-10-CM | POA: Diagnosis present

## 2018-07-09 DIAGNOSIS — F419 Anxiety disorder, unspecified: Secondary | ICD-10-CM

## 2018-07-09 DIAGNOSIS — N760 Acute vaginitis: Secondary | ICD-10-CM | POA: Diagnosis present

## 2018-07-09 DIAGNOSIS — F1510 Other stimulant abuse, uncomplicated: Secondary | ICD-10-CM | POA: Diagnosis present

## 2018-07-09 DIAGNOSIS — Z9049 Acquired absence of other specified parts of digestive tract: Secondary | ICD-10-CM | POA: Diagnosis not present

## 2018-07-09 DIAGNOSIS — F1424 Cocaine dependence with cocaine-induced mood disorder: Secondary | ICD-10-CM | POA: Diagnosis not present

## 2018-07-09 DIAGNOSIS — I10 Essential (primary) hypertension: Secondary | ICD-10-CM | POA: Diagnosis present

## 2018-07-09 DIAGNOSIS — F1494 Cocaine use, unspecified with cocaine-induced mood disorder: Secondary | ICD-10-CM | POA: Diagnosis present

## 2018-07-09 DIAGNOSIS — B1920 Unspecified viral hepatitis C without hepatic coma: Secondary | ICD-10-CM | POA: Diagnosis present

## 2018-07-09 DIAGNOSIS — W57XXXA Bitten or stung by nonvenomous insect and other nonvenomous arthropods, initial encounter: Secondary | ICD-10-CM | POA: Diagnosis present

## 2018-07-09 DIAGNOSIS — G4700 Insomnia, unspecified: Secondary | ICD-10-CM | POA: Diagnosis present

## 2018-07-09 DIAGNOSIS — R45851 Suicidal ideations: Secondary | ICD-10-CM | POA: Diagnosis present

## 2018-07-09 MED ORDER — ACETAMINOPHEN 325 MG PO TABS
650.0000 mg | ORAL_TABLET | Freq: Four times a day (QID) | ORAL | Status: DC | PRN
  Administered 2018-07-09 – 2018-07-14 (×6): 650 mg via ORAL
  Filled 2018-07-09 (×5): qty 2

## 2018-07-09 MED ORDER — PERMETHRIN 1 % EX LOTN
TOPICAL_LOTION | Freq: Once | CUTANEOUS | Status: AC
  Administered 2018-07-09: 18:00:00 via TOPICAL
  Filled 2018-07-09: qty 59

## 2018-07-09 MED ORDER — PERMETHRIN 5 % EX CREA
TOPICAL_CREAM | Freq: Once | CUTANEOUS | Status: DC
  Filled 2018-07-09: qty 60

## 2018-07-09 MED ORDER — CEPHALEXIN 500 MG PO CAPS
500.0000 mg | ORAL_CAPSULE | Freq: Three times a day (TID) | ORAL | Status: DC
  Administered 2018-07-09 – 2018-07-15 (×18): 500 mg via ORAL
  Filled 2018-07-09 (×7): qty 1
  Filled 2018-07-09: qty 12
  Filled 2018-07-09 (×6): qty 1
  Filled 2018-07-09: qty 12
  Filled 2018-07-09 (×2): qty 1
  Filled 2018-07-09: qty 12
  Filled 2018-07-09 (×6): qty 1

## 2018-07-09 MED ORDER — MAGNESIUM HYDROXIDE 400 MG/5ML PO SUSP: 30.0000 mL | Freq: Every day | ORAL | Status: DC | PRN

## 2018-07-09 MED ORDER — TRAZODONE HCL 50 MG PO TABS
50.0000 mg | ORAL_TABLET | Freq: Every evening | ORAL | Status: DC | PRN
  Administered 2018-07-09 – 2018-07-14 (×6): 50 mg via ORAL
  Filled 2018-07-09 (×6): qty 1

## 2018-07-09 MED ORDER — FLUOXETINE HCL 20 MG PO CAPS
20.0000 mg | ORAL_CAPSULE | Freq: Every day | ORAL | Status: DC
  Administered 2018-07-10: 20 mg via ORAL
  Filled 2018-07-09 (×4): qty 1

## 2018-07-09 MED ORDER — NICOTINE 21 MG/24HR TD PT24
21.0000 mg | MEDICATED_PATCH | Freq: Every day | TRANSDERMAL | Status: DC
  Administered 2018-07-10 – 2018-07-15 (×6): 21 mg via TRANSDERMAL
  Filled 2018-07-09 (×9): qty 1

## 2018-07-09 MED ORDER — ALUM & MAG HYDROXIDE-SIMETH 200-200-20 MG/5ML PO SUSP: 30.0000 mL | ORAL | Status: DC | PRN

## 2018-07-09 MED ORDER — HYDROXYZINE HCL 25 MG PO TABS
25.0000 mg | ORAL_TABLET | Freq: Three times a day (TID) | ORAL | Status: DC | PRN
  Administered 2018-07-09 – 2018-07-14 (×8): 25 mg via ORAL
  Filled 2018-07-09 (×8): qty 1
  Filled 2018-07-09: qty 10

## 2018-07-09 NOTE — [facility] Group Notes (Signed)
[facility] Group Notes:  (Nursing/MHT/Case Management/Adjunct)  Date:  07/09/2018  Time:  4:00 PM  Type of Therapy:  Nurse Education  Participation Level:  Did Not Attend  Summary of Progress/Problems: Patient is restricted to her room for the time being. Patient remained in her room.  Dewayne Shorterlyssa  Branch Holt 07/09/2018, 6:46 PM

## 2018-07-09 NOTE — Progress Notes (Signed)
Pt observed resting in bed with eyes open. Pt did not attend NA meeting due to room restriction. Pt appears flat in affect and mood. Pt denies SI/HI/AVH/Pain at this time. Pt requested vistaril and trazodone at bedtime. No new c/o's. Pt was guarded with interaction. Snacks and fluids brought and left by bedside.Support and encouragement offered. Will continue with POC.
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
## 2018-07-09 NOTE — Tx Team (Signed)
Initial Treatment Plan 07/09/2018 2:32 PM Cheryl Holt ION:[id number]    PATIENT STRESSORS: Financial difficulties Marital or family conflict Substance abuse   PATIENT STRENGTHS: Ability for insight Average or above average intelligence Capable of independent living General fund of knowledge Motivation for treatment/growth   PATIENT IDENTIFIED PROBLEMS: Depression Substance Abuse Suicidal thoughts "I just feel like giving up on everything"                     DISCHARGE CRITERIA:  Ability to meet basic life and health needs Improved stabilization in mood, thinking, and/or behavior Reduction of life-threatening or endangering symptoms to within safe limits Verbal commitment to aftercare and medication compliance  PRELIMINARY DISCHARGE PLAN: Attend aftercare/continuing care group  PATIENT/FAMILY INVOLVEMENT: This treatment plan has been presented to and reviewed with the patient, Cheryl Holt, and/or family member, .  The patient and family have been given the opportunity to ask questions and make suggestions.  Kavin Weckwerth, [place], [place] 07/09/2018, 2:32 PM

## 2018-07-09 NOTE — Progress Notes (Signed)
Patient remained in bed during the evening speaker NA meeting due to room restriction.
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
## 2018-07-09 NOTE — H&P (Signed)
Psychiatric Admission Assessment Adult  Patient Identification: Cheryl Holt MRN:  [id number] Date of Evaluation:  07/09/2018 Chief Complaint:  MDD REC SEV WITHOUT PSYCHOTIC FEATURES COCAINE USE DISORDER Principal Diagnosis: <principal problem not specified> Diagnosis:  Active Problems:   MDD (major depressive disorder), recurrent severe, without psychosis (HCC)   Cocaine-induced mood disorder (HCC)  History of Present Illness: Patient is seen and examined.  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  The patient presented to the [facility] emergency department on 07/08/2018 originally for rash and itching, but also suppressed suicidal ideation.  The patient stated that she had relapsed on cocaine, and her father kicked her out of the house.  She had been staying with people in [place], then went back to [facility] after she relapsed.  Her father would not tolerate this.  She had been sober for more than 3 to 4 months recently.  She stated she was hanging out with the wrong crowd in [place] and relapse.  She is concerned that she might have bedbugs.  She had a previous admission to [facility] in 2016 under similar circumstances, and was being treated with fluoxetine, trazodone.  She also had a previous overdose on Seroquel in 2016.  In the [facility] emergency room she was stating that she would walk into traffic.  She is seeking admission into a rehabilitation facility.  She stated that her last substance rehabilitation facility admission was somewhere within the last 12 months.  She was admitted to the hospital for evaluation and stabilization.  Associated Signs/Symptoms: Depression Symptoms:  depressed mood, anhedonia, insomnia, psychomotor agitation, fatigue, feelings of worthlessness/guilt, difficulty concentrating, hopelessness, suicidal  thoughts without plan, anxiety, panic attacks, loss of energy/fatigue, disturbed sleep, (Hypo) Manic Symptoms:  Irritable Mood, Anxiety Symptoms:  Excessive Worry, Psychotic Symptoms:  denied PTSD Symptoms: Negative Total Time spent with patient: 30 minutes  Past Psychiatric History: Previous psych hospitalization at [facility] in 2016  Is the patient at risk to self? Yes.    Has the patient been a risk to self in the past 6 months? Yes.    Has the patient been a risk to self within the distant past? No.  Is the patient a risk to others? No.  Has the patient been a risk to others in the past 6 months? No.  Has the patient been a risk to others within the distant past? No.   Prior Inpatient Therapy: Prior Inpatient Therapy: Yes Prior Therapy Dates: 2016 Prior Therapy Facilty/Provider(s): [facility] Reason for Treatment: Suicide attempt by OD Prior Outpatient Therapy:    Alcohol Screening: 1. How often do you have a drink containing alcohol?: Never 2. How many drinks containing alcohol do you have on a typical day when you are drinking?: 1 or 2 3. How often do you have six or more drinks on one occasion?: Never AUDIT-C Score: 0 4. How often during the last year have you found that you were not able to stop drinking once you had started?: Never 5. How often during the last year have you failed to do what was normally expected from you becasue of drinking?: Never 6. How often during the last year have you needed a first drink in the morning to get yourself going after a heavy drinking session?: Never 7. How often during the last year have you had a feeling of guilt of remorse after  drinking?: Never 8. How often during the last year have you been unable to remember what happened the night before because you had been drinking?: Never 9. Have you or someone else been injured as a result of your drinking?: No 10. Has a relative or friend or a doctor or another health worker been  concerned about your drinking or suggested you cut down?: No Alcohol Use Disorder Identification Test Final Score (AUDIT): 0 Intervention/Follow-up: AUDIT Score <7 follow-up not indicated Substance Abuse History in the last 12 months:  Yes.   Consequences of Substance Abuse: Medical Consequences:  hepatitis C Previous Psychotropic Medications: Yes  Psychological Evaluations: Yes  Past Medical History:  Past Medical History:  Diagnosis Date  . Depression   . Gestational diabetes   . Headache   . Hepatitis C 05/11/2016   [ ]  f/u quant [ ]  cmp  . Infection    UTI  . IV drug abuse (HCC)   . Polysubstance abuse [facility](HCC)     Past Surgical History:  Procedure Laterality Date  . CESAREAN SECTION    . CHOLECYSTECTOMY, LAPAROSCOPIC  2014   Family History:  Family History  Problem Relation Age of Onset  . Cancer Mother        pancreatic  . Migraines Mother   . Mental illness Father   . Epilepsy Father   . Migraines Father   . Hypertension Father    Family Psychiatric  History: Father has PTSD Tobacco Screening: Have you used any form of tobacco in the last 30 days? (Cigarettes, Smokeless Tobacco, Cigars, and/or Pipes): Yes Tobacco use, Select all that apply: 5 or more cigarettes per day Are you interested in Tobacco Cessation Medications?: Yes, will notify MD for an order Counseled patient on smoking cessation including recognizing danger situations, developing coping skills and basic information about quitting provided: Refused/Declined practical counseling Social History:  Social History   Substance and Sexual Activity  Alcohol Use No     Social History   Substance and Sexual Activity  Drug Use Yes  . Types: Marijuana, Cocaine   Comment: not since pregnancy    Additional Social History: Marital status: Single                         Allergies:  No Known Allergies Lab Results: No results found for this or any previous visit (from the past 48 hour(s)).  Blood  Alcohol level:  Lab Results  Component Value Date   ETH <10 05/20/2017   ETH <5 01/17/2016    Metabolic Disorder Labs:  Lab Results  Component Value Date   HGBA1C 5.4 09/16/2017   MPG 91 05/10/2016   No results found for: PROLACTIN No results found for: CHOL, TRIG, HDL, CHOLHDL, VLDL, LDLCALC  Current Medications: Current Facility-Administered Medications  Medication Dose Route Frequency Provider Last Rate Last Dose  . acetaminophen (TYLENOL) tablet 650 mg  650 mg Oral Q6H PRN Antonieta Pertlary,  Lawson, MD      . alum & mag hydroxide-simeth (MAALOX/MYLANTA) 200-200-20 MG/5ML suspension 30 mL  30 mL Oral Q4H PRN Antonieta Pertlary,  Lawson, MD      . cephALEXin [facility](KEFLEX) capsule 500 mg  500 mg Oral Q8H , Marlane Mingle Lawson, MD      . FLUoxetine (PROZAC) capsule 20 mg  20 mg Oral Daily Antonieta Pertlary,  Lawson, MD      . hydrOXYzine (ATARAX/VISTARIL) tablet 25 mg  25 mg Oral TID PRN Antonieta Pertlary,  Lawson, MD      .  magnesium hydroxide (MILK OF MAGNESIA) suspension 30 mL  30 mL Oral Daily PRN Antonieta Pert, MD      . nicotine (NICODERM CQ - dosed in mg/24 hours) patch 21 mg  21 mg Transdermal Daily Antonieta Pert, MD      . permethrin (ELIMITE) 1 % lotion   Topical Once Antonieta Pert, MD      . traZODone (DESYREL) tablet 50 mg  50 mg Oral QHS PRN Antonieta Pert, MD       PTA Medications: Medications Prior to Admission  Medication Sig Dispense Refill Last Dose  . buprenorphine (SUBUTEX) 8 MG SUBL SL tablet TK 1 T PO Q 8 H  0 Taking  . calcium carbonate (TUMS - DOSED IN MG ELEMENTAL CALCIUM) 500 MG chewable tablet Chew 1 tablet by mouth 2 (two) times daily as needed for indigestion or heartburn.   Taking  . hydrOXYzine (ATARAX/VISTARIL) 25 MG tablet Take 1 tablet (25 mg total) by mouth at bedtime as needed for itching. 30 tablet 2   . metroNIDAZOLE (FLAGYL) 500 MG tablet Take 1 tablet (500 mg total) by mouth 2 (two) times daily. 14 tablet 0   . NIFEdipine (PROCARDIA XL) 30 MG 24 hr tablet Take 1  tablet (30 mg total) by mouth 2 (two) times daily as needed (contractions). 30 tablet 0   . permethrin (ELIMITE) 5 % cream Apply to affected area once 60 g 2   . Prenatal Vit-Fe Fumarate-FA (PRENATAL MULTIVITAMIN) TABS tablet Take 1 tablet by mouth daily at 12 noon. 30 tablet 0 Taking  . TraZODone & Diet Manage Prod (TRAZAMINE PO) Take by mouth.       Musculoskeletal: Strength & Muscle Tone: within normal limits Gait & Station: normal Patient leans: N/A  Psychiatric Specialty Exam: Physical Exam  Nursing note and vitals reviewed. Constitutional: She is oriented to person, place, and time. She appears well-developed and well-nourished.  HENT:  Head: Normocephalic and atraumatic.  Respiratory: Effort normal.  Neurological: She is alert and oriented to person, place, and time.    ROS  Blood pressure 123/72, pulse 91, temperature 98.3 F (36.8 C), temperature source Oral, resp. rate 18, height 5\' 5"  (1.651 m), weight 97.1 kg, SpO2 99 %, unknown if currently breastfeeding.Body mass index is 35.61 kg/m.  General Appearance: Disheveled  Eye Contact:  Fair  Speech:  Slow  Volume:  Decreased  Mood:  Anxious and Depressed  Affect:  Congruent  Thought Process:  Coherent and Descriptions of Associations: Intact  Orientation:  Full (Time, Place, and Person)  Thought Content:  Logical  Suicidal Thoughts:  Yes.  without intent/plan  Homicidal Thoughts:  No  Memory:  Immediate;   Fair Recent;   Fair Remote;   Fair  Judgement:  Impaired  Insight:  Fair  Psychomotor Activity:  Decreased  Concentration:  Concentration: Fair and Attention Span: Fair  Recall:  [employer] of Knowledge:  Fair  Language:  Fair  Akathisia:  Negative  Handed:  Right  AIMS (if indicated):     Assets:  Desire for Improvement Resilience  ADL's:  Intact  Cognition:  WNL  Sleep:       Treatment Plan Summary: Daily contact with patient to assess and evaluate symptoms and progress in treatment, Medication  management and Plan : Patient is seen and examined.  Patient is a 27 year old female with a past psychiatric history significant for cocaine dependence and cocaine induced mood disorder who presented to the [facility] emergency  department with suicidal ideation.  He was transferred to our facility for evaluation and stabilization.  She is seeking substance rehabilitation treatment after discharge.  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  Her laboratories reflect probable thrombocytopenia and elevation of liver function enzymes from her hepatitis.  She also has a urinary tract infection, and we will treat that.  Her drug screen was positive for amphetamines and cocaine.  I did ask her directly about amphetamines but she denied this.  Her platelets are slightly low.  Her AST is 47 and her ALT is 76.  We will continue the Prozac at 20 mg p.o. daily, and titrate this as necessary.  She will have hydroxyzine available for anxiety, and trazodone for sleep as needed.  She is not been taking opiates and we will just monitor her for withdrawal symptoms.  She did receive penicillin G and Keflex in the emergency room.  We will continue the Keflex here for her urinary tract infection.  She also has bites all over her body, but it does not look like scabies to me.  She has been previously treated on 3 occasions.  They look more like fleas or bug bites.  That being said given the potential infective nature of the scabies we will go on and treat her for that and keep her in her room by herself.  Observation Level/Precautions:  Detox 15 minute checks  Laboratory:  Chemistry Profile  Psychotherapy:    Medications:    Consultations:    Discharge Concerns:    Estimated LOS:  Other:     Physician Treatment Plan for Primary Diagnosis: <principal problem not specified> Long Term Goal(s): Improvement in symptoms so as ready for discharge  Short Term Goals: Ability to identify changes in lifestyle  to reduce recurrence of condition will improve, Ability to verbalize feelings will improve, Ability to disclose and discuss suicidal ideas, Ability to demonstrate self-control will improve, Ability to identify and develop effective coping behaviors will improve, Ability to maintain clinical measurements within normal limits will improve, Compliance with prescribed medications will improve and Ability to identify triggers associated with substance abuse/mental health issues will improve  Physician Treatment Plan for Secondary Diagnosis: Active Problems:   MDD (major depressive disorder), recurrent severe, without psychosis (HCC)   Cocaine-induced mood disorder (HCC)  Long Term Goal(s): Improvement in symptoms so as ready for discharge  Short Term Goals: Ability to identify changes in lifestyle to reduce recurrence of condition will improve, Ability to verbalize feelings will improve, Ability to disclose and discuss suicidal ideas, Ability to demonstrate self-control will improve, Ability to identify and develop effective coping behaviors will improve, Ability to maintain clinical measurements within normal limits will improve, Compliance with prescribed medications will improve and Ability to identify triggers associated with substance abuse/mental health issues will improve  I certify that inpatient services furnished can reasonably be expected to improve the patient's condition.    Antonieta Pert, MD 12/18/20193:37 PM

## 2018-07-09 NOTE — Progress Notes (Signed)
[name] is a 27 year old female pt [facility]. On admission, she reports that she has been feeling depressed and suicidal and reports how she feels like giving up on everything. She reports that she has a psychiatrist who prescribes her medications and reports that she takes prozac and trazodone. She reports that she was clean and sober for 4 months and spoke about relapsing on cocaine but reports that she used to do heroin, which was her drug of choice. She does present with a rash on her arms, trunk and legs and described it as burning but reports that it is improving and reports that she got it by walking over a carpet. She reports that she is currently homeless and unsure of where she will go once she is discharged. She was escorted to the unit, oriented to the milieu and safety maintained.

## 2018-07-10 LAB — HEPATIC FUNCTION PANEL
ALT: 55 U/L — ABNORMAL HIGH (ref 0–44)
AST: 33 U/L (ref 15–41)
Albumin: 3.4 g/dL — ABNORMAL LOW (ref 3.5–5.0)
Alkaline Phosphatase: 56 U/L (ref 38–126)
Bilirubin, Direct: 0.2 mg/dL (ref 0.0–0.2)
Indirect Bilirubin: 0.4 mg/dL (ref 0.3–0.9)
Total Bilirubin: 0.6 mg/dL (ref 0.3–1.2)
Total Protein: 5.9 g/dL — ABNORMAL LOW (ref 6.5–8.1)

## 2018-07-10 MED ORDER — GABAPENTIN 100 MG PO CAPS
100.0000 mg | ORAL_CAPSULE | Freq: Three times a day (TID) | ORAL | Status: DC
  Administered 2018-07-10 – 2018-07-15 (×14): 100 mg via ORAL
  Filled 2018-07-10: qty 1
  Filled 2018-07-10: qty 21
  Filled 2018-07-10 (×4): qty 1
  Filled 2018-07-10: qty 21
  Filled 2018-07-10 (×4): qty 1
  Filled 2018-07-10: qty 21
  Filled 2018-07-10 (×10): qty 1

## 2018-07-10 MED ORDER — FLUOXETINE HCL 20 MG PO CAPS
40.0000 mg | ORAL_CAPSULE | Freq: Every day | ORAL | Status: DC
  Administered 2018-07-11 – 2018-07-15 (×5): 40 mg via ORAL
  Filled 2018-07-10 (×5): qty 2
  Filled 2018-07-10: qty 14
  Filled 2018-07-10: qty 2

## 2018-07-10 NOTE — [facility] Group Notes (Signed)
The focus of this group is to help patients establish daily goals to achieve during treatment and discuss how the patient can incorporate goal setting into their daily lives to aide in recovery.  Pt. Did not attend 

## 2018-07-10 NOTE — [facility] Suicide Risk Assessment (Signed)
[facility] INPATIENT:  Family/Significant Other Suicide Prevention Education  Suicide Prevention Education:  Patient Refusal for Family/Significant Other Suicide Prevention Education: The patient Cheryl Holt has refused to provide written consent for family/significant other to be provided Family/Significant Other Suicide Prevention Education during admission and/or prior to discharge.  Physician notified.  SPE completed with pt, as pt refused to consent to family contact. SPI pamphlet provided to pt and pt was encouraged to share information with support network, ask questions, and talk about any concerns relating to SPE. Pt denies access to guns/firearms and verbalized understanding of information provided. Mobile Crisis information also provided to pt.   Rona RavensHeather S Alexandros Ewan LCSW 07/10/2018, 11:14 AM

## 2018-07-10 NOTE — Progress Notes (Signed)
[facility] MD Progress Note  07/10/2018 2:04 PM Cheryl Holt  MRN:  [id number] Subjective:  "I have a sore throat. 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 The patient stated that she had relapsed on cocaine, and her father kicked her out of the house. She had been staying with people in [place], then went back to [facility] after she relapsed. Her father would not tolerate this. She had been sober for more than 3 to 4 months recently. She stated she was hanging out with the wrong crowd in [employer] and relapse. She is concerned that she might have bedbugs. She had a previous admission to [facility] in 2016 under similar circumstances, and was being treated with fluoxetine, trazodone. She also had a previous overdose on Seroquel in 2016. In the [facility] emergency room she was stating that she would walk into traffic. She is seeking admission into a rehabilitation facility. She stated that her last substance rehabilitation facility admission was somewhere within the last 12 months. She was admitted to the hospital for evaluation and stabilization.  Today Cheryl Holt is found lying in bed on assessment, states she has a sore throat, is tired, and wants to stay in bed. She is irritable and answers questions loudly but does not elaborate. Today she rates depression 8/10. She reports feeling hopeless because she relapsed on cocaine, and her family will not let her stay with them. She denies SI during the assessment and states she will let staff know if SI re-occurs. Denies HI, AVH. Patient states desire to enter residential treatment for substance  use.  Labs reviewed. AST, ALT trending down since transfer from [place]. UDS at [facility] was positive for amphetamines as well as cocaine. Patient denies amphetamine use when informed, says "Someone must have slipped it in with the cocaine." She is Hepatitis C positive, states she is unsure when last HIV test was done.  Principal Problem: Cocaine-induced mood disorder (HCC) Diagnosis: Principal Problem:   Cocaine-induced mood disorder (HCC) Active Problems:   MDD (major depressive disorder), recurrent severe, without psychosis (HCC)  Total Time spent with patient: 15 minutes  Past Psychiatric History: See admission H&P  Past Medical History:  Past Medical History:  Diagnosis Date  . Depression   . Gestational diabetes   . Headache   . Hepatitis C 05/11/2016   [ ]  f/u quant [ ]  cmp  . Infection    UTI  . IV drug abuse (HCC)   . Polysubstance abuse [facility])     Past Surgical History:  Procedure Laterality Date  . CESAREAN SECTION    . CHOLECYSTECTOMY, LAPAROSCOPIC  2014   Family History:  Family History  Problem Relation Age of Onset  . Cancer Mother        pancreatic  . Migraines Mother   . Mental illness Father   . Epilepsy Father   . Migraines Father   . Hypertension Father    Family Psychiatric  History: See admission H&P Social History:  Social History   Substance and Sexual Activity  Alcohol Use No     Social History   Substance and Sexual Activity  Drug Use Yes  . Types: Marijuana, Cocaine   Comment: not since  pregnancy    Social History   Socioeconomic History  . Marital status: Divorced    Spouse name: Not on file  . Number of children: Not on file  . Years of education: Not on file  . Highest education level: Not on file  Occupational History  . Not on file  Social Needs  . Financial resource strain: Not on file  . Food insecurity:    Worry: Not on file    Inability: Not on file  . Transportation needs:    Medical: Not on file     Non-medical: Not on file  Tobacco Use  . Smoking status: Current Every Day Smoker    Packs/day: 0.50    Years: 10.00    Pack years: 5.00    Types: Cigarettes  . Smokeless tobacco: Never Used  Substance and Sexual Activity  . Alcohol use: No  . Drug use: Yes    Types: Marijuana, Cocaine    Comment: not since pregnancy  . Sexual activity: Not on file  Lifestyle  . Physical activity:    Days per week: Not on file    Minutes per session: Not on file  . Stress: Not on file  Relationships  . Social connections:    Talks on phone: Not on file    Gets together: Not on file    Attends religious service: Not on file    Active member of club or organization: Not on file    Attends meetings of clubs or organizations: Not on file    Relationship status: Not on file  Other Topics Concern  . Not on file  Social History Narrative  . Not on file   Additional Social History:                         Sleep: Fair  Appetite:  Fair  Current Medications: Current Facility-Administered Medications  Medication Dose Route Frequency Provider Last Rate Last Dose  . acetaminophen (TYLENOL) tablet 650 mg  650 mg Oral Q6H PRN Antonieta Pertlary, Greg Lawson, MD   650 mg at 07/10/18 1202  . alum & mag hydroxide-simeth (MAALOX/MYLANTA) 200-200-20 MG/5ML suspension 30 mL  30 mL Oral Q4H PRN Antonieta Pertlary, Greg Lawson, MD      . cephALEXin [facility](KEFLEX) capsule 500 mg  500 mg Oral Q8H Antonieta Pertlary, Greg Lawson, MD   500 mg at 07/10/18 0602  . [START ON 07/11/2018] FLUoxetine (PROZAC) capsule 40 mg  40 mg Oral Daily Aldean BakerSykes, Jayziah Bankhead E, NP      . hydrOXYzine (ATARAX/VISTARIL) tablet 25 mg  25 mg Oral TID PRN Antonieta Pertlary, Greg Lawson, MD   25 mg at 07/09/18 2036  . magnesium hydroxide (MILK OF MAGNESIA) suspension 30 mL  30 mL Oral Daily PRN Antonieta Pertlary, Greg Lawson, MD      . nicotine (NICODERM CQ - dosed in mg/24 hours) patch 21 mg  21 mg Transdermal Daily Antonieta Pertlary, Greg Lawson, MD   21 mg at 07/10/18 0918  . traZODone (DESYREL) tablet 50 mg  50  mg Oral QHS PRN Antonieta Pertlary, Greg Lawson, MD   50 mg at 07/09/18 2036    Lab Results:  Results for orders placed or performed during the hospital encounter of 07/09/18 (from the past 48 hour(s))  Hepatic function panel     Status: Abnormal   Collection Time: 07/10/18  6:32 AM  Result Value Ref Range   Total Protein 5.9 (L) 6.5 - 8.1 g/dL   Albumin 3.4 (L) 3.5 -  5.0 g/dL   AST 33 15 - 41 U/L   ALT 55 (H) 0 - 44 U/L   Alkaline Phosphatase 56 38 - 126 U/L   Total Bilirubin 0.6 0.3 - 1.2 mg/dL   Bilirubin, Direct 0.2 0.0 - 0.2 mg/dL   Indirect Bilirubin 0.4 0.3 - 0.9 mg/dL    Comment: Performed at [facility], [address] [place], [place] [id number]    Blood Alcohol level:  Lab Results  Component Value Date   ETH <10 05/20/2017   ETH <5 01/17/2016    Metabolic Disorder Labs: Lab Results  Component Value Date   HGBA1C 5.4 09/16/2017   MPG 91 05/10/2016   No results found for: PROLACTIN No results found for: CHOL, TRIG, HDL, CHOLHDL, VLDL, LDLCALC  Physical Findings: AIMS: Facial and Oral Movements Muscles of Facial Expression: None, normal Lips and Perioral Area: None, normal Jaw: None, normal Tongue: None, normal,Extremity Movements Upper (arms, wrists, hands, fingers): None, normal Lower (legs, knees, ankles, toes): None, normal, Trunk Movements Neck, shoulders, hips: None, normal, Overall Severity Severity of abnormal movements (highest score from questions above): None, normal Incapacitation due to abnormal movements: None, normal Patient's awareness of abnormal movements (rate only patient's report): No Awareness, Dental Status Current problems with teeth and/or dentures?: No Does patient usually wear dentures?: No  CIWA:    COWS:     Musculoskeletal: Strength & Muscle Tone: UTA- patient lying in bed Gait & Station: UTA- patient lying in bed Patient leans: UTA- patient lying in bed  Psychiatric Specialty Exam: Physical Exam  Constitutional:  She is oriented to person, place, and time.  HENT:  Head: Normocephalic.  Respiratory: Effort normal.  Neurological: She is oriented to person, place, and time.    Review of Systems  Constitutional: Positive for malaise/fatigue.  HENT: Positive for sore throat.   Skin: Positive for rash (Treated for bed bugs on admit).  Neurological: Positive for headaches.  Psychiatric/Behavioral: Positive for depression, substance abuse (UDS + amphetamines, cocaine; hx heroin use) and suicidal ideas (Intermittent SI no plan no intent). Negative for hallucinations and memory loss. The patient has insomnia. The patient is not nervous/anxious.   All other systems reviewed and are negative.   Blood pressure (!) 130/97, pulse (!) 112, temperature 98.3 F (36.8 C), temperature source Oral, resp. rate 18, height 5\' 5"  (1.651 m), weight 97.1 kg, SpO2 99 %, unknown if currently breastfeeding.Body mass index is 35.61 kg/m.  General Appearance: Disheveled  Eye Contact:  Poor  Speech:  Clear and Coherent  Volume:  Increased  Mood:  Depressed and Irritable  Affect:  Congruent  Thought Process:  Coherent  Orientation:  Full (Time, Place, and Person)  Thought Content:  Logical  Suicidal Thoughts:  Yes.  without intent/plan- intermittent SI with no plan or intent- states she will let staff know if re-occurs  Homicidal Thoughts:  No  Memory:  Immediate;   Good  Judgement:  Poor  Insight:  Lacking  Psychomotor Activity:  UTA- patient lying in bed  Concentration:  Concentration: Fair  Recall:  [employer]Fair  Fund of Knowledge:  Fair  Language:  Good  Akathisia:  No  Handed:  Right  AIMS (if indicated):     Assets:  Communication Skills Leisure Time  ADL's:  Intact  Cognition:  WNL  Sleep:  Number of Hours: 4     Treatment Plan Summary: Daily contact with patient to assess and evaluate symptoms and progress in treatment and Medication management  HIV  test ordered and pending due to hx IV drug use    Mood: Increase Prozac from 20 mg to 40 mg PO daily  Anxiety: Continue Vistaril 25 mg PO Q8HR PRN anxiety  Sleep: Continue trazodone 50 mg QHS PRN sleep  Nicotine dependence Continue nicotine 21 mg patch transdermal daily  Other medical: Keflex 500 mg PO Q8HR   Patient will participate in the therapeutic group milieu.  Discharge disposition in progress.   Aldean Baker, NP 07/10/2018, 2:04 PM

## 2018-07-10 NOTE — Progress Notes (Signed)
Patient ID: Marcelino ScotBethany Cassatt, female   DOB: September 10, 1990, 27 y.o.   MRN: [id number]  Patient [facility] in locker [facility] MHT has washed all clothing in hot water setting immediately after admission. Patient has been treated with permethrin cream per MD order. Per [facility] policy, patient can remain on standard precautions.

## 2018-07-10 NOTE — Progress Notes (Signed)
Patient rates her day as a 7 out of 10 since she was able to be more open with her peers. She states that her day was a big improvement over yesterday which she rated as a 0 out of 10.  Her goal for tomorrow is to make tomorrow a better day.
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
## 2018-07-10 NOTE — Progress Notes (Signed)
Pt observed interacting with peers in the dayroom. Pt appears anxious in affect and mood. Pt denies SI/HI/AVH/Pain at this time. Pt requested vistaril and trazodone at bedtime. No new c/o's. Pt was minimal with interaction.Pt encourage to push fluids.Support and encouragement offered.Will continue with POC.

## 2018-07-10 NOTE — Progress Notes (Signed)
Patient ID: Marcelino ScotBethany Wierenga, female   DOB: 07-18-1991, 27 y.o.   MRN: [id number]  Nursing Progress Note [id number]  Data: Patient presents with flat affect and sad/sullen mood. On initial approach, patient is lying in bed awake. Patient reports she doesn't "feel well" physically or emotionally but denies withdrawal symptoms. Patient was provided morning medications and patient denied need for PRNs. Patient encouraged to come out into the milieu for groups and was informed she is expected to come to the cafeteria for meals. Patient provided but declined to complete their self-inventory sheet. Patient currently endorses passive SI with no current plan/intent and denies HI/AVH.   Action: Patient is educated about and provided medication per provider's orders. Patient safety maintained with q15 min safety checks and frequent rounding. Low fall risk precautions in place. Emotional support given. 1:1 interaction and active listening provided. Patient encouraged to attend meals, groups, and work on treatment plan and goals. Labs, vital signs and patient behavior monitored throughout shift.   Response: Patient remains safe on the unit at this time and agrees to come to staff with any issues/concerns. Patient is interacting with peers appropriately on the unit. Will continue to support and monitor.

## 2018-07-11 NOTE — Progress Notes (Signed)
Recreation Therapy Notes  Date: 12.20.19 Time: 0930 Location: [address] Dayroom  Group Topic: Stress Management  Goal Area(s) Addresses:  Patient will verbalize importance of using healthy stress management.  Patient will identify positive emotions associated with healthy stress management.   Intervention: Stress Management  Activity :  Guided Imagery.  LRT introduced the stress management technique of guided imagery.  LRT read a script on sitting and observing the starry sky at night.  Patients were to follow along as script as read.  Education:  Stress Management, Discharge Planning.   Education Outcome: Acknowledges edcuation/In group clarification offered/Needs additional education  Clinical Observations/Feedback: Pt did not attend group.     Caroll RancherMarjette Montie Gelardi, LRT/CTRS         Lillia AbedLindsay, Yancy Knoble A 07/11/2018 12:00 PM

## 2018-07-11 NOTE — Progress Notes (Signed)
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 The patient stated that she had relapsed on cocaine, and her father kicked her out of the house. She had been staying with people in [place], then went back to [facility] after she relapsed. Her father would not tolerate this. She had been sober for more than 3 to 4 months recently. She stated she was hanging out with the wrong crowd in [place] and relapse. She is concerned that she might have bedbugs. She had a previous admission to [facility] in 2016 under similar circumstances, and was being treated with fluoxetine, trazodone. She also had a previous overdose on Seroquel in 2016. In the [facility] emergency room she was stating that she would walk into traffic. She is seeking admission into a rehabilitation facility. She stated that her last substance rehabilitation facility admission was somewhere within the last 12 months. She was admitted to the hospital for evaluation and stabilization.  Patient resting in bed on assessment today. States she prefers to stay in bed due to headache. Cooperative with assessment but still provides limited information with vague answers. States depression is improving and rates depression 7/10 today. Denies SI, HI, AVH. Reports she is homeless and wants residential substance treatment for the cocaine use.   She continues to deny any other substance use besides cocaine, although UDS positive for amphetamines and has abscess in leg with history of IV heroin use. During assessment today she reports  daily use of cocaine for the last two months. Per chart review, she told LCSW she had just relapsed on cocaine this past weekend. HIV test results still pending.  Principal Problem: Cocaine-induced mood disorder (HCC) Diagnosis: Principal Problem:   Cocaine-induced mood disorder (HCC) Active Problems:   MDD (major depressive disorder), recurrent severe, without psychosis (HCC)  Total Time spent with patient: 15 minutes  Past Psychiatric History: See admission H&P  Past Medical History:  Past Medical History:  Diagnosis Date  . Depression   . Gestational diabetes   . Headache   . Hepatitis C 05/11/2016   [ ]  f/u quant [ ]  cmp  . Infection    UTI  . IV drug abuse (HCC)   . Polysubstance abuse [facility])     Past Surgical History:  Procedure Laterality Date  . CESAREAN SECTION    . CHOLECYSTECTOMY, LAPAROSCOPIC  2014   Family History:  Family History  Problem Relation Age of Onset  . Cancer Mother        pancreatic  . Migraines Mother   . Mental illness Father   . Epilepsy Father   . Migraines Father   . Hypertension Father    Family Psychiatric  History: See admission H&P Social History:  Social History   Substance and Sexual Activity  Alcohol Use No     Social History   Substance and Sexual Activity  Drug Use Yes  . Types: Marijuana, Cocaine   Comment: not since pregnancy    Social History   Socioeconomic History  . Marital status: Divorced    Spouse name: Not on file  . Number of children: Not on file  .  Years of education: Not on file  . Highest education level: Not on file  Occupational History  . Not on file  Social Needs  . Financial resource strain: Not on file  . Food insecurity:    Worry: Not on file    Inability: Not on file  . Transportation needs:    Medical: Not on file    Non-medical: Not on file  Tobacco Use  . Smoking status: Current Every Day Smoker    Packs/day: 0.50    Years: 10.00    Pack years: 5.00    Types: Cigarettes  .  Smokeless tobacco: Never Used  Substance and Sexual Activity  . Alcohol use: No  . Drug use: Yes    Types: Marijuana, Cocaine    Comment: not since pregnancy  . Sexual activity: Not on file  Lifestyle  . Physical activity:    Days per week: Not on file    Minutes per session: Not on file  . Stress: Not on file  Relationships  . Social connections:    Talks on phone: Not on file    Gets together: Not on file    Attends religious service: Not on file    Active member of club or organization: Not on file    Attends meetings of clubs or organizations: Not on file    Relationship status: Not on file  Other Topics Concern  . Not on file  Social History Narrative  . Not on file   Additional Social History:                         Sleep: Fair  Appetite:  Good  Current Medications: Current Facility-Administered Medications  Medication Dose Route Frequency Provider Last Rate Last Dose  . acetaminophen (TYLENOL) tablet 650 mg  650 mg Oral Q6H PRN Antonieta Pertlary, Greg Lawson, MD   650 mg at 07/11/18 0803  . alum & mag hydroxide-simeth (MAALOX/MYLANTA) 200-200-20 MG/5ML suspension 30 mL  30 mL Oral Q4H PRN Antonieta Pertlary, Greg Lawson, MD      . cephALEXin [facility](KEFLEX) capsule 500 mg  500 mg Oral Q8H Antonieta Pertlary, Greg Lawson, MD   500 mg at 07/11/18 0539  . FLUoxetine (PROZAC) capsule 40 mg  40 mg Oral Daily Aldean BakerSykes, Reighan Hipolito E, NP   40 mg at 07/11/18 16100803  . gabapentin (NEURONTIN) capsule 100 mg  100 mg Oral TID Antonieta Pertlary, Greg Lawson, MD   100 mg at 07/11/18 0803  . hydrOXYzine (ATARAX/VISTARIL) tablet 25 mg  25 mg Oral TID PRN Antonieta Pertlary, Greg Lawson, MD   25 mg at 07/10/18 2203  . magnesium hydroxide (MILK OF MAGNESIA) suspension 30 mL  30 mL Oral Daily PRN Antonieta Pertlary, Greg Lawson, MD      . nicotine (NICODERM CQ - dosed in mg/24 hours) patch 21 mg  21 mg Transdermal Daily Antonieta Pertlary, Greg Lawson, MD   21 mg at 07/11/18 0803  . traZODone (DESYREL) tablet 50 mg  50 mg Oral QHS PRN Antonieta Pertlary, Greg Lawson, MD   50 mg at 07/10/18  2203    Lab Results:  Results for orders placed or performed during the hospital encounter of 07/09/18 (from the past 48 hour(s))  Hepatic function panel     Status: Abnormal   Collection Time: 07/10/18  6:32 AM  Result Value Ref Range   Total Protein 5.9 (L) 6.5 - 8.1 g/dL   Albumin 3.4 (L) 3.5 - 5.0 g/dL   AST 33 15 - 41 U/L  ALT 55 (H) 0 - 44 U/L   Alkaline Phosphatase 56 38 - 126 U/L   Total Bilirubin 0.6 0.3 - 1.2 mg/dL   Bilirubin, Direct 0.2 0.0 - 0.2 mg/dL   Indirect Bilirubin 0.4 0.3 - 0.9 mg/dL    Comment: Performed at [facility], [address], [place], [place] [address]    Blood Alcohol level:  Lab Results  Component Value Date   ETH <10 05/20/2017   ETH <5 01/17/2016    Metabolic Disorder Labs: Lab Results  Component Value Date   HGBA1C 5.4 09/16/2017   MPG 91 05/10/2016   No results found for: PROLACTIN No results found for: CHOL, TRIG, HDL, CHOLHDL, VLDL, LDLCALC  Physical Findings: AIMS: Facial and Oral Movements Muscles of Facial Expression: None, normal Lips and Perioral Area: None, normal Jaw: None, normal Tongue: None, normal,Extremity Movements Upper (arms, wrists, hands, fingers): None, normal Lower (legs, knees, ankles, toes): None, normal, Trunk Movements Neck, shoulders, hips: None, normal, Overall Severity Severity of abnormal movements (highest score from questions above): None, normal Incapacitation due to abnormal movements: None, normal Patient's awareness of abnormal movements (rate only patient's report): No Awareness, Dental Status Current problems with teeth and/or dentures?: No Does patient usually wear dentures?: No  CIWA:    COWS:     Musculoskeletal: Strength & Muscle Tone: UTA- patient lying in bed Gait & Station: UTA-patient lying in bed Patient leans: N/A  Psychiatric Specialty Exam: Physical Exam  Nursing note and vitals reviewed. Constitutional: She is oriented to person, place, and time.   HENT:  Head: Normocephalic.  Respiratory: Effort normal.  Neurological: She is oriented to person, place, and time.    Review of Systems  Psychiatric/Behavioral: Positive for depression (Improving) and substance abuse (hx cocaine, heroin; UDS + amphetamines). Negative for hallucinations, memory loss and suicidal ideas. The patient is not nervous/anxious and does not have insomnia.     Blood pressure 111/68, pulse (!) 115, temperature (!) 97.5 F (36.4 C), temperature source Oral, resp. rate 20, height 5\' 5"  (1.651 m), weight 97.1 kg, SpO2 99 %, unknown if currently breastfeeding.Body mass index is 35.61 kg/m.  General Appearance: Disheveled  Eye Contact:  Minimal  Speech:  Clear and Coherent  Volume:  Normal  Mood:  Depressed  Affect:  Congruent  Thought Process:  Coherent  Orientation:  Full (Time, Place, and Person)  Thought Content:  WDL  Suicidal Thoughts:  No  Homicidal Thoughts:  No  Memory:  Immediate;   Good  Judgement:  Fair  Insight:  Shallow  Psychomotor Activity:  Normal  Concentration:  Concentration: Good  Recall:  Good  Fund of Knowledge:  Fair  Language:  Good  Akathisia:  No  Handed:  Right  AIMS (if indicated):     Assets:  Communication Skills Leisure Time  ADL's:  Intact  Cognition:  WNL  Sleep:  Number of Hours: 5.75     Treatment Plan Summary: Daily contact with patient to assess and evaluate symptoms and progress in treatment and Medication management  HIV test ordered and pending due to hx IV drug use   Mood: Continue Prozac 40 mg PO daily  Anxiety: Continue Vistaril 25 mg PO Q8HR PRN anxiety  Sleep: Continue trazodone 50 mg QHS PRN sleep  Nicotine dependence Continue nicotine 21 mg patch transdermal daily  Other medical: Continue Keflex 500 mg PO Q8HR   Patient will participate in the therapeutic group milieu.  Discharge disposition in progress  Aldean Baker,  NP 07/11/2018, 11:48 AM

## 2018-07-11 NOTE — Tx Team (Signed)
Interdisciplinary Treatment and Diagnostic Plan Update  07/11/2018 Time of Session: [id number] Cheryl Holt MRN: [id number]  Principal Diagnosis: Cocaine-induced mood disorder ([place])  Secondary Diagnoses: Principal Problem:   Cocaine-induced mood disorder ([place]) Active Problems:   MDD (major depressive disorder), recurrent severe, without psychosis ([place])   Current Medications:  Current Facility-Administered Medications  Medication Dose Route Frequency Provider Last Rate Last Dose  . acetaminophen (TYLENOL) tablet 650 mg  650 mg Oral Q6H PRN Sharma Covert, MD   650 mg at 07/11/18 0803  . alum & mag hydroxide-simeth (MAALOX/MYLANTA) 200-200-20 MG/5ML suspension 30 mL  30 mL Oral Q4H PRN Sharma Covert, MD      . cephALEXin [facility]) capsule 500 mg  500 mg Oral Q8H Sharma Covert, MD   500 mg at 07/11/18 0539  . FLUoxetine (PROZAC) capsule 40 mg  40 mg Oral Daily Connye Burkitt, NP   40 mg at 07/11/18 6195  . gabapentin (NEURONTIN) capsule 100 mg  100 mg Oral TID Sharma Covert, MD   100 mg at 07/11/18 0803  . hydrOXYzine (ATARAX/VISTARIL) tablet 25 mg  25 mg Oral TID PRN Sharma Covert, MD   25 mg at 07/10/18 2203  . magnesium hydroxide (MILK OF MAGNESIA) suspension 30 mL  30 mL Oral Daily PRN Sharma Covert, MD      . nicotine (NICODERM CQ - dosed in mg/24 hours) patch 21 mg  21 mg Transdermal Daily Sharma Covert, MD   21 mg at 07/11/18 0803  . traZODone (DESYREL) tablet 50 mg  50 mg Oral QHS PRN Sharma Covert, MD   50 mg at 07/10/18 2203   PTA Medications: Medications Prior to Admission  Medication Sig Dispense Refill Last Dose  . FLUoxetine (PROZAC) 40 MG capsule Take 40 mg by mouth daily.     . hydrOXYzine (ATARAX/VISTARIL) 25 MG tablet Take 1 tablet (25 mg total) by mouth at bedtime as needed for itching. (Patient not taking: Reported on 07/10/2018) 30 tablet 2 Not Taking at Unknown time  . metroNIDAZOLE (FLAGYL) 500 MG tablet Take 1 tablet (500 mg  total) by mouth 2 (two) times daily. (Patient not taking: Reported on 07/10/2018) 14 tablet 0 Completed Course at Unknown time  . NIFEdipine (PROCARDIA XL) 30 MG 24 hr tablet Take 1 tablet (30 mg total) by mouth 2 (two) times daily as needed (contractions). (Patient not taking: Reported on 07/10/2018) 30 tablet 0 Not Taking at Unknown time  . permethrin (ELIMITE) 5 % cream Apply to affected area once (Patient not taking: Reported on 07/10/2018) 60 g 2 Completed Course at Unknown time  . Prenatal Vit-Fe Fumarate-FA (PRENATAL MULTIVITAMIN) TABS tablet Take 1 tablet by mouth daily at 12 noon. (Patient not taking: Reported on 07/10/2018) 30 tablet 0 Not Taking at Unknown time    Patient Stressors: Financial difficulties Marital or family conflict Substance abuse  Patient Strengths: Ability for insight Average or above average intelligence Capable of independent living [employer] of knowledge Motivation for treatment/growth  Treatment Modalities: Medication Management, Group therapy, Case management,  1 to 1 session with clinician, Psychoeducation, Recreational therapy.   Physician Treatment Plan for Primary Diagnosis: Cocaine-induced mood disorder ([place]) Long Term Goal(s): Improvement in symptoms so as ready for discharge Improvement in symptoms so as ready for discharge   Short Term Goals: Ability to identify changes in lifestyle to reduce recurrence of condition will improve Ability to verbalize feelings will improve Ability to disclose and discuss suicidal ideas Ability to  demonstrate self-control will improve Ability to identify and develop effective coping behaviors will improve Ability to maintain clinical measurements within normal limits will improve Compliance with prescribed medications will improve Ability to identify triggers associated with substance abuse/mental health issues will improve Ability to identify changes in lifestyle to reduce recurrence of condition will  improve Ability to verbalize feelings will improve Ability to disclose and discuss suicidal ideas Ability to demonstrate self-control will improve Ability to identify and develop effective coping behaviors will improve Ability to maintain clinical measurements within normal limits will improve Compliance with prescribed medications will improve Ability to identify triggers associated with substance abuse/mental health issues will improve  Medication Management: Evaluate patient's response, side effects, and tolerance of medication regimen.  Therapeutic Interventions: 1 to 1 sessions, Unit Group sessions and Medication administration.  Evaluation of Outcomes: Not Met  Physician Treatment Plan for Secondary Diagnosis: Principal Problem:   Cocaine-induced mood disorder ([place]) Active Problems:   MDD (major depressive disorder), recurrent severe, without psychosis ([place])  Long Term Goal(s): Improvement in symptoms so as ready for discharge Improvement in symptoms so as ready for discharge   Short Term Goals: Ability to identify changes in lifestyle to reduce recurrence of condition will improve Ability to verbalize feelings will improve Ability to disclose and discuss suicidal ideas Ability to demonstrate self-control will improve Ability to identify and develop effective coping behaviors will improve Ability to maintain clinical measurements within normal limits will improve Compliance with prescribed medications will improve Ability to identify triggers associated with substance abuse/mental health issues will improve Ability to identify changes in lifestyle to reduce recurrence of condition will improve Ability to verbalize feelings will improve Ability to disclose and discuss suicidal ideas Ability to demonstrate self-control will improve Ability to identify and develop effective coping behaviors will improve Ability to maintain clinical measurements within normal limits will  improve Compliance with prescribed medications will improve Ability to identify triggers associated with substance abuse/mental health issues will improve     Medication Management: Evaluate patient's response, side effects, and tolerance of medication regimen.  Therapeutic Interventions: 1 to 1 sessions, Unit Group sessions and Medication administration.  Evaluation of Outcomes: Not Met   RN Treatment Plan for Primary Diagnosis: Cocaine-induced mood disorder ([place]) Long Term Goal(s): Knowledge of disease and therapeutic regimen to maintain health will improve  Short Term Goals: Ability to remain free from injury will improve, Ability to demonstrate self-control, Ability to disclose and discuss suicidal ideas and Compliance with prescribed medications will improve  Medication Management: RN will administer medications as ordered by provider, will assess and evaluate patient's response and provide education to patient for prescribed medication. RN will report any adverse and/or side effects to prescribing provider.  Therapeutic Interventions: 1 on 1 counseling sessions, Psychoeducation, Medication administration, Evaluate responses to treatment, Monitor vital signs and CBGs as ordered, Perform/monitor CIWA, COWS, AIMS and Fall Risk screenings as ordered, Perform wound care treatments as ordered.  Evaluation of Outcomes: Not Met   LCSW Treatment Plan for Primary Diagnosis: Cocaine-induced mood disorder ([place]) Long Term Goal(s): Safe transition to appropriate next level of care at discharge, Engage patient in therapeutic group addressing interpersonal concerns.  Short Term Goals: Engage patient in aftercare planning with referrals and resources, Facilitate patient progression through stages of change regarding substance use diagnoses and concerns and Identify triggers associated with mental health/substance abuse issues  Therapeutic Interventions: Assess for all discharge needs, 1 to 1 time  with Social worker, Explore available resources and support systems,  Assess for adequacy in community support network, Educate family and significant other(s) on suicide prevention, Complete Psychosocial Assessment, Interpersonal group therapy.  Evaluation of Outcomes: Not Met   Progress in Treatment: Attending groups: No. Participating in groups: No. Pt remains isolative in room.  Taking medication as prescribed: Yes. Toleration medication: Yes. Family/Significant other contact made: SPE completed with pt; pt declined to consent to collateral contact.  Patient understands diagnosis: Yes. Discussing patient identified problems/goals with staff: Yes. Medical problems stabilized or resolved: Yes. Denies suicidal/homicidal ideation: Yes. Issues/concerns per patient self-inventory: No. Other: n/a   New problem(s) identified: No, Describe:  n/a  New Short Term/Long Term Goal(s): detox, medication management for mood stabilization; elimination of SI thoughts; development of comprehensive mental wellness/sobriety plan.   Patient Goals:  "I don't have a goal. I want to just give up."   Discharge Plan or Barriers: CSW assessing. Pt reports that she recently relapsed on cocaine and is denying that she abused opiates or amphetamines despite having abscess in leg from shooting up and being positive for amphetamines. [place] pamphlet, Mobile Crisis information, and AA/NA information provided to patient for additional community support and resources.   Reason for Continuation of Hospitalization: Anxiety Depression Medication stabilization Suicidal ideation Withdrawal symptoms  Estimated Length of Stay:  Monday, 07/14/18  Attendees: Patient: Cheryl Holt 07/11/2018 10:36 AM  Physician: Dr. Jake Samples MD 07/11/2018 10:36 AM  Nursing: Chong Sicilian RN; Legrand Como RN 07/11/2018 10:36 AM  RN Care Manager:x 07/11/2018 10:36 AM  Social Worker: Janice Norrie LCSW 07/11/2018 10:36 AM  Recreational Therapist: x  07/11/2018 10:36 AM  Other: Lindell Spar NP; Harriett Sine NP 07/11/2018 10:36 AM  Other:  07/11/2018 10:36 AM  Other: 07/11/2018 10:36 AM    Scribe for Treatment Team: Avelina Laine, LCSW 07/11/2018 10:36 AM

## 2018-07-11 NOTE — Plan of Care (Signed)
Progress note  D: pt found in bed; compliant with medication administration. Pt states she slept well. Pt denies any physical pain or symptoms. Pt denies any si/hi/ah/vh and verbally agrees to approach staff if these become apparent or before harm[facility]. Pt failed to fill out her self inventory today.  A: pt provided support and encouragement. Pt given medication per protocol and standing orders. Q8279m safety checks implemented and continued.  R: pt safe on the unit. Will continue to monitor.   Pt progressing in the following metrics  Problem: Education: Goal: Knowledge of [place] [facility] Education information/materials will improve Outcome: Progressing Goal: Emotional status will improve Outcome: Progressing Goal: Mental status will improve Outcome: Progressing Goal: Verbalization of understanding the information provided will improve Outcome: Progressing

## 2018-07-12 DIAGNOSIS — F1494 Cocaine use, unspecified with cocaine-induced mood disorder: Secondary | ICD-10-CM

## 2018-07-12 NOTE — Progress Notes (Signed)
Psychoeducational Group Note  Date:  07/12/2018 Time:  2234  Group Topic/Focus:  Wrap-Up Group:   The focus of this group is to help patients review their daily goal of treatment and discuss progress on daily workbooks.  Participation Level: Did Not Attend  Participation Quality:  Not Applicable  Affect:  Not Applicable  Cognitive:  Not Applicable  Insight:  Not Applicable  Engagement in Group: Not Applicable  Additional Comments:  The patient did not attend group this evening since she was asleep in her bedroom.   Hazle CocaGOODMAN, Allure Greaser S 07/12/2018, 10:34 PM

## 2018-07-12 NOTE — Progress Notes (Signed)
Patient has been in bed resting and had to be called to come get her medications. She reported that she was really tired and needed to rest. Patient did not attend group and voiced no complaints. Support given and safety maintained on unit with 15 min checks.

## 2018-07-12 NOTE — [facility] Group Notes (Signed)
[facility] Group Notes: (Clinical Social Work)   07/12/2018      Type of Therapy:  Group Therapy   Participation Level:  Did Not Attend despite MHT prompting   Ambrose MantleMareida Grossman-Orr, LCSW 07/12/2018, 2:37 PM

## 2018-07-12 NOTE — Progress Notes (Signed)
D.  Pt remained in room most of evening shift, did come up for medications.  Pt was pleasant but flat affect, no complaints voiced.  Pt sat in dayroom briefly and then went to bed.  Pt denies SI/HI/AVH at this time.  A.  Support and encouragement offered, medication given as ordered R.  Pt remains safe on the unit, will continue to monitor.

## 2018-07-12 NOTE — Progress Notes (Signed)
[facility]  07/12/2018 12:56 PM Marcelino ScotBethany Locey  MRN:  [id number]  Evaluation: Cheryl Holt  observed standing at the nurses station waiting for medication administration.  She is awake alert and oriented x3.  Presents flat guarded but pleasant.  Chart reviewed patient continues to isolate to her room.  As she indicates "not feeling well."  Denies suicidal or homicidal ideations during this assessment.  Denies auditory or visual hallucinations.  Rates her depression a 4 out of 10 with 10 being the worst.  Patient's Prozac was recently increased from 20 mg to 40 mg p.o. daily.  Patient reports taking and tolerating medications well.  Reports she is homeless and is hopeful to attend the [facility] residential treatment on Tuesday.  Reports a good appetite.  States she is resting well throughout the night.  Treatment plan indicate follow-up labs however lab has been unsuccessful with collection. support encouragement reassurance was provided.    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 The patient stated that she had relapsed on cocaine, and her father kicked her out of the house. She had been staying with people in [place], then went back to [place] [place] after she relapsed. Her father would not tolerate this. She had been sober for more than 3 to 4 months recently. She stated she was hanging out with the wrong crowd in [place] and relapse. She is concerned that she might have bedbugs. She had a previous admission to [facility] in 2016 under similar circumstances, and was being treated with fluoxetine, trazodone. She also had a previous overdose on Seroquel in 2016. In the [facility]  emergency room she was stating that she would walk into traffic. She is seeking admission into a rehabilitation facility. She stated that her last substance rehabilitation facility admission was somewhere within the last 12 months. She was admitted to the hospital for evaluation and stabilization.    Principal Problem: Cocaine-induced mood disorder (HCC) Diagnosis: Principal Problem:   Cocaine-induced mood disorder (HCC) Active Problems:   MDD (major depressive disorder), recurrent severe, without psychosis (HCC)  Total Time spent with patient: 15 minutes  Past Psychiatric History: See admission H&P  Past Medical History:  Past Medical History:  Diagnosis Date  . Depression   . Gestational diabetes   . Headache   . Hepatitis C 05/11/2016   [ ]  f/u quant [ ]  cmp  . Infection    UTI  . IV drug abuse (HCC)   . Polysubstance abuse [facility](HCC)     Past Surgical History:  Procedure Laterality Date  . CESAREAN SECTION    . CHOLECYSTECTOMY, LAPAROSCOPIC  2014   Family History:  Family History  Problem Relation Age of Onset  . Cancer Mother        pancreatic  . Migraines Mother   . Mental illness Father   . Epilepsy Father   . Migraines Father   . Hypertension Father    Family Psychiatric  History: See admission H&P Social History:  Social History   Substance and Sexual Activity  Alcohol Use No     Social History   Substance and Sexual Activity  Drug Use Yes  . Types: Marijuana, Cocaine   Comment: not since pregnancy    Social History  Socioeconomic History  . Marital status: Divorced    Spouse name: Not on file  . Number of children: Not on file  . Years of education: Not on file  . Highest education level: Not on file  Occupational History  . Not on file  Social Needs  . Financial resource strain: Not on file  . Food insecurity:    Worry: Not on file    Inability: Not on file  . Transportation needs:    Medical: Not on file    Non-medical: Not on file   Tobacco Use  . Smoking status: Current Every Day Smoker    Packs/day: 0.50    Years: 10.00    Pack years: 5.00    Types: Cigarettes  . Smokeless tobacco: Never Used  Substance and Sexual Activity  . Alcohol use: No  . Drug use: Yes    Types: Marijuana, Cocaine    Comment: not since pregnancy  . Sexual activity: Not on file  Lifestyle  . Physical activity:    Days per week: Not on file    Minutes per session: Not on file  . Stress: Not on file  Relationships  . Social connections:    Talks on phone: Not on file    Gets together: Not on file    Attends religious service: Not on file    Active member of club or organization: Not on file    Attends meetings of clubs or organizations: Not on file    Relationship status: Not on file  Other Topics Concern  . Not on file  Social History Narrative  . Not on file   Additional Social History:                         Sleep: Fair  Appetite:  Fair  Current Medications: Current Facility-Administered Medications  Medication Dose Route Frequency Provider Last Rate Last Dose  . acetaminophen (TYLENOL) tablet 650 mg  650 mg Oral Q6H PRN Antonieta Pert, MD   650 mg at 07/11/18 0803  . alum & mag hydroxide-simeth (MAALOX/MYLANTA) 200-200-20 MG/5ML suspension 30 mL  30 mL Oral Q4H PRN Antonieta Pert, MD      . cephALEXin [facility]) capsule 500 mg  500 mg Oral Q8H Antonieta Pert, MD   500 mg at 07/12/18 1308  . FLUoxetine (PROZAC) capsule 40 mg  40 mg Oral Daily Aldean Baker, NP   40 mg at 07/12/18 0831  . gabapentin (NEURONTIN) capsule 100 mg  100 mg Oral TID Antonieta Pert, MD   100 mg at 07/12/18 0831  . hydrOXYzine (ATARAX/VISTARIL) tablet 25 mg  25 mg Oral TID PRN Antonieta Pert, MD   25 mg at 07/11/18 2211  . magnesium hydroxide (MILK OF MAGNESIA) suspension 30 mL  30 mL Oral Daily PRN Antonieta Pert, MD      . nicotine (NICODERM CQ - dosed in mg/24 hours) patch 21 mg  21 mg Transdermal Daily Antonieta Pert, MD   21 mg at 07/12/18 6578  . traZODone (DESYREL) tablet 50 mg  50 mg Oral QHS PRN Antonieta Pert, MD   50 mg at 07/11/18 2211    Lab Results:  No results found for this or any previous visit (from the past 48 hour(s)).  Blood Alcohol level:  Lab Results  Component Value Date   [facility] <10 05/20/2017   ETH <5 01/17/2016    Metabolic Disorder Labs: Lab  Results  Component Value Date   HGBA1C 5.4 09/16/2017   MPG 91 05/10/2016   No results found for: PROLACTIN No results found for: CHOL, TRIG, HDL, CHOLHDL, VLDL, LDLCALC  Physical Findings: AIMS: Facial and Oral Movements Muscles of Facial Expression: None, normal Lips and Perioral Area: None, normal Jaw: None, normal Tongue: None, normal,Extremity Movements Upper (arms, wrists, hands, fingers): None, normal Lower (legs, knees, ankles, toes): None, normal, Trunk Movements Neck, shoulders, hips: None, normal, Overall Severity Severity of abnormal movements (highest score from questions above): None, normal Incapacitation due to abnormal movements: None, normal Patient's awareness of abnormal movements (rate only patient's report): No Awareness, Dental Status Current problems with teeth and/or dentures?: No Does patient usually wear dentures?: No  CIWA:    COWS:     Musculoskeletal: Strength & Muscle Tone: within normal limits Gait & Station: normal Patient leans: N/A  Psychiatric Specialty Exam: Physical Exam  Constitutional: She is oriented to person, place, and time.  HENT:  Head: Normocephalic.  Respiratory: Effort normal.  Neurological: She is oriented to person, place, and time.    Review of Systems  Skin: Rash: Treated for bed bugs on admit.  Neurological: Positive for headaches.  Psychiatric/Behavioral: Positive for depression and substance abuse (UDS + amphetamines, cocaine; hx heroin use). Negative for hallucinations, memory loss and suicidal ideas (Intermittent SI no plan no intent). The  patient has insomnia. The patient is not nervous/anxious.   All other systems reviewed and are negative.   Blood pressure 137/83, pulse (!) 129, temperature 98.1 F (36.7 C), temperature source Oral, resp. rate 20, height 5\' 5"  (1.651 m), weight 97.1 kg, SpO2 100 %, unknown if currently breastfeeding.Body mass index is 35.61 kg/m.  General Appearance: Casual and Guarded  Eye Contact:  Fair  Speech:  Clear and Coherent  Volume:  Increased  Mood:  Depressed and Irritable  Affect:  Congruent  Thought Process:  Coherent  Orientation:  Full (Time, Place, and Person)  Thought Content:  Logical  Suicidal Thoughts:  Yes.  without intent/plan- intermittent SI with no plan or intent- states she will let staff know if re-occurs  Homicidal Thoughts:  No  Memory:  Immediate;   Good  Judgement:  Poor  Insight:  Lacking  Psychomotor Activity:  Normal  Concentration:  Concentration: Fair  Recall:  Fair  Fund of Knowledge:  Fair  Language:  Good  Akathisia:  No  Handed:  Right  AIMS (if indicated):     Assets:  Communication Skills Leisure Time  ADL's:  Intact  Cognition:  WNL  Sleep:  Number of Hours: 6.75     Treatment Plan Summary: Daily contact with patient to assess and evaluate symptoms and progress in treatment and Medication management   Continue with current treatment plan on 07/12/2018 as listed below except for noted  HIV test ordered and pending due to hx IV drug use (unable to collect labs due to difficult lab stick)   Mood:  Continue Prozac from 20 mg to 40 mg PO daily  Anxiety:  Continue Vistaril 25 mg PO Q8HR PRN anxiety  Sleep:  Continue trazodone 50 mg QHS PRN sleep  Nicotine dependence  Continue nicotine 21 mg patch transdermal daily  Other medical:  Keflex 500 mg PO Q8HR   Patient will participate in the therapeutic group milieu. Discharge disposition in progress.   Oneta Rackanika N , NP 07/12/2018, 12:56 PM

## 2018-07-13 LAB — AMMONIA: AMMONIA: 39 umol/L — AB (ref 9–35)

## 2018-07-13 NOTE — Plan of Care (Signed)
D: Patient in bed on approach. Patient is cooperative. Patient came to medication window when prompted for night medications then went back to bed. Patient denies SI, HI, AVH, and verbally contracts for safety. Patient endorses feeling depressed. Patient denies physical symptoms/pain.    A: Medications administered per MD order. Support provided. Patient educated on safety on the unit and medications. Routine safety checks every 15 minutes. Patient stated understanding to tell nurse about any new physical symptoms. Patient understands to tell staff of any needs.     R: No adverse drug reactions noted. Patient verbally contracts for safety. Patient remains safe at this time and will continue to monitor.   Problem: Education: Goal: Knowledge of [facility] Education information/materials will improve Outcome: Progressing   Problem: Safety: Goal: Periods of time without injury will increase Outcome: Progressing   Patient oriented to the unit. Patient remains safe and will continue to monitor.

## 2018-07-13 NOTE — Progress Notes (Signed)
[facility] MD Progress Note  07/13/2018 12:50 PM Cheryl Holt  MRN:  [id number]  Evaluation: Cheryl Holt awake alert oriented x3.  Continues to deny suicidal or homicidal ideations.  Denies auditory or visual hallucinations.  Reports feeling better overall and has concerns with her discharge plan.  NP discussed with social worker discharge disposition.  Social worker to contact with [facility], as indicated beds available today.  Patient denies depression or depressive symptoms.   Chart review patient continues to isolate to her room.  Reports she is going to attempt to attend group sessions on today.  Patient to continue Prozac 40 mg as this was recently increased.  Continue gabapentin and trazodone as indicated per chart. denies cravings for cocaine.  Reports a good appetite.  States she is resting well throughout the night.  Support encouragement reassurance was provided.   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 The patient stated that she had relapsed on cocaine, and her father kicked her out of the house. She had been staying with people in [place], then went back to [facility] after she relapsed. Her father would not tolerate this. She had been sober for more than 3 to 4 months recently. She stated she was hanging out with the wrong crowd in [place] and relapse. She is concerned that she might have bedbugs. She had a previous admission to [facility] in 2016 under similar circumstances, and was being treated with fluoxetine, trazodone. She also had a previous overdose on Seroquel in 2016. In the [facility] emergency room she was stating that she would walk into traffic. She is seeking  admission into a rehabilitation facility. She stated that her last substance rehabilitation facility admission was somewhere within the last 12 months. She was admitted to the hospital for evaluation and stabilization.    Principal Problem: Cocaine-induced mood disorder (HCC) Diagnosis: Principal Problem:   Cocaine-induced mood disorder (HCC) Active Problems:   MDD (major depressive disorder), recurrent severe, without psychosis (HCC)  Total Time spent with patient: 15 minutes  Past Psychiatric History: See admission H&P  Past Medical History:  Past Medical History:  Diagnosis Date  . Depression   . Gestational diabetes   . Headache   . Hepatitis C 05/11/2016   [ ]  f/u quant [ ]  cmp  . Infection    UTI  . IV drug abuse (HCC)   . Polysubstance abuse [facility]     Past Surgical History:  Procedure Laterality Date  . CESAREAN SECTION    . CHOLECYSTECTOMY, LAPAROSCOPIC  2014   Family History:  Family History  Problem Relation Age of Onset  . Cancer Mother        pancreatic  . Migraines Mother   . Mental illness Father   . Epilepsy Father   . Migraines Father   . Hypertension Father    Family Psychiatric  History: See admission H&P Social History:  Social History   Substance and Sexual Activity  Alcohol Use No     Social History   Substance and Sexual Activity  Drug Use Yes  . Types: Marijuana, Cocaine   Comment: not since pregnancy    Social History   Socioeconomic History  . Marital status: Divorced    Spouse name: Not on file  .  Number of children: Not on file  . Years of education: Not on file  . Highest education level: Not on file  Occupational History  . Not on file  Social Needs  . Financial resource strain: Not on file  . Food insecurity:    Worry: Not on file    Inability: Not on file  . Transportation needs:    Medical: Not on file    Non-medical: Not on file  Tobacco Use  . Smoking status: Current Every Day Smoker    Packs/day: 0.50     Years: 10.00    Pack years: 5.00    Types: Cigarettes  . Smokeless tobacco: Never Used  Substance and Sexual Activity  . Alcohol use: No  . Drug use: Yes    Types: Marijuana, Cocaine    Comment: not since pregnancy  . Sexual activity: Not on file  Lifestyle  . Physical activity:    Days per week: Not on file    Minutes per session: Not on file  . Stress: Not on file  Relationships  . Social connections:    Talks on phone: Not on file    Gets together: Not on file    Attends religious service: Not on file    Active member of club or organization: Not on file    Attends meetings of clubs or organizations: Not on file    Relationship status: Not on file  Other Topics Concern  . Not on file  Social History Narrative  . Not on file   Additional Social History:                         Sleep: Fair  Appetite:  Fair  Current Medications: Current Facility-Administered Medications  Medication Dose Route Frequency Provider Last Rate Last Dose  . acetaminophen (TYLENOL) tablet 650 mg  650 mg Oral Q6H PRN Antonieta Pertlary, Greg Lawson, MD   650 mg at 07/12/18 1725  . alum & mag hydroxide-simeth (MAALOX/MYLANTA) 200-200-20 MG/5ML suspension 30 mL  30 mL Oral Q4H PRN Antonieta Pertlary, Greg Lawson, MD      . cephALEXin [facility](KEFLEX) capsule 500 mg  500 mg Oral Q8H Antonieta Pertlary, Greg Lawson, MD   500 mg at 07/13/18 [phone] [id number]  . FLUoxetine (PROZAC) capsule 40 mg  40 mg Oral Daily Aldean BakerSykes, Janet E, NP   40 mg at 07/13/18 0905  . gabapentin (NEURONTIN) capsule 100 mg  100 mg Oral TID Antonieta Pertlary, Greg Lawson, MD   100 mg at 07/13/18 1249  . hydrOXYzine (ATARAX/VISTARIL) tablet 25 mg  25 mg Oral TID PRN Antonieta Pertlary, Greg Lawson, MD   25 mg at 07/12/18 2227  . magnesium hydroxide (MILK OF MAGNESIA) suspension 30 mL  30 mL Oral Daily PRN Antonieta Pertlary, Greg Lawson, MD      . nicotine (NICODERM CQ - dosed in mg/24 hours) patch 21 mg  21 mg Transdermal Daily Antonieta Pertlary, Greg Lawson, MD   21 mg at 07/13/18 [id number]  . traZODone (DESYREL) tablet 50 mg  50  mg Oral QHS PRN Antonieta Pertlary, Greg Lawson, MD   50 mg at 07/12/18 2227    Lab Results:  No results found for this or any previous visit (from the past 48 hour(s)).  Blood Alcohol level:  Lab Results  Component Value Date   [facility] <[facility] 05/20/2017   ETH <5 01/17/2016    Metabolic Disorder Labs: Lab Results  Component Value Date   HGBA1C 5.4 09/16/2017   MPG 91 05/10/2016  No results found for: PROLACTIN No results found for: CHOL, TRIG, HDL, CHOLHDL, VLDL, LDLCALC  Physical Findings: AIMS: Facial and Oral Movements Muscles of Facial Expression: None, normal Lips and Perioral Area: None, normal Jaw: None, normal Tongue: None, normal,Extremity Movements Upper (arms, wrists, hands, fingers): None, normal Lower (legs, knees, ankles, toes): None, normal, Trunk Movements Neck, shoulders, hips: None, normal, Overall Severity Severity of abnormal movements (highest score from questions above): None, normal Incapacitation due to abnormal movements: None, normal Patient's awareness of abnormal movements (rate only patient's report): No Awareness, Dental Status Current problems with teeth and/or dentures?: No Does patient usually wear dentures?: No  CIWA:    COWS:     Musculoskeletal: Strength & Muscle Tone: within normal limits Gait & Station: normal Patient leans: N/A  Psychiatric Specialty Exam: Physical Exam  Constitutional: She is oriented to person, place, and time. She appears well-developed.  HENT:  Head: Normocephalic.  Respiratory: Effort normal.  Neurological: She is oriented to person, place, and time.    Review of Systems  Skin: Rash: Treated for bed bugs on admit.  Neurological: Positive for headaches.  Psychiatric/Behavioral: Positive for depression and substance abuse (UDS + amphetamines, cocaine; hx heroin use). Negative for hallucinations, memory loss and suicidal ideas (Intermittent SI no plan no intent). The patient has insomnia. The patient is not nervous/anxious.    All other systems reviewed and are negative.   Blood pressure 137/83, pulse (!) 129, temperature 98.1 F (36.7 C), temperature source Oral, resp. rate 20, height 5\' 5"  (1.651 m), weight 97.1 kg, SpO2 100 %, unknown if currently breastfeeding.Body mass index is 35.61 kg/m.  General Appearance: Casual and Guarded  Eye Contact:  Fair  Speech:  Clear and Coherent  Volume:  Increased  Mood:  Depressed and Irritable  Affect:  Congruent  Thought Process:  Coherent  Orientation:  Full (Time, Place, and Person)  Thought Content:  Logical  Suicidal Thoughts:  No   Homicidal Thoughts:  No  Memory:  Immediate;   Good  Judgement:  Poor  Insight:  Lacking  Psychomotor Activity:  Normal  Concentration:  Concentration: Fair  Recall:  Fair  Fund of Knowledge:  Fair  Language:  Good  Akathisia:  No  Handed:  Right  AIMS (if indicated):     Assets:  Communication Skills Leisure Time  ADL's:  Intact  Cognition:  WNL  Sleep:  Number of Hours: 6.5     Treatment Plan Summary: Daily contact with patient to assess and evaluate symptoms and progress in treatment and Medication management   Continue with current treatment plan on 07/13/2018 as listed below except for noted  HIV test ordered and pending due to hx IV drug use (unable to collect labs due to difficult lab stick)   Mood:  Continue Prozac 40 mg PO daily  Anxiety:  Continue Vistaril 25 mg PO Q8HR PRN anxiety  Sleep:  Continue trazodone 50 mg QHS PRN sleep  Nicotine dependence  Continue nicotine 21 mg patch transdermal daily  Other medical:  Keflex 500 mg PO Q8HR   Patient will participate in the therapeutic group milieu. Discharge disposition in progress. -Consider [facility] screening referral.  Oneta Rackanika N Eddye Broxterman, NP 07/13/2018, 12:50 PM

## 2018-07-13 NOTE — Progress Notes (Signed)
D Pt is observed OOB AUL on the 300 hall today. She endorses a flat, depressed affect. She avoids making eye contact with this Clinical research associatewriter. She does attend her groups and shetakes  Her scheduled meds a splanned.     A She did complete her daily assessment and on this she wrote she deneid SI today and she rated ehr depression, hopelessness and anxeity " 6/4/7", respectively. She says " my family kicked me out...they don't love me". Writer listended to pt and offered encouragement and acceptance.      R Safety is in place.

## 2018-07-13 NOTE — Plan of Care (Signed)
  Problem: Activity: Goal: Interest or engagement in activities will improve Outcome: Progressing   

## 2018-07-13 NOTE — [facility] Group Notes (Signed)
[facility] Group Notes: (Clinical Social Work)   07/13/2018      Type of Therapy:  Group Therapy   Participation Level:  Did Not Attend despite MHT prompting   Kalani Sthilaire Grossman-Orr, LCSW 07/13/2018, 12:41 PM     

## 2018-07-13 NOTE — [facility] Group Notes (Signed)
[facility] Group Notes:  Nursing Psychoeducation  Date:  07/13/2018  Time:  1:30 PM  Type of Therapy:  Psychoeducational Skills   Group Topic: Life Skills Facilitated By: Patricia D. RN  Participation Level:  Active  Participation Quality:  Appropriate  Affect:  Appropriate  Cognitive:  Alert and Oriented  Insight:  Improving  Engagement in Group:  Developing/Improving  Modes of Intervention:  Discussion, Education and Socialization  Summary of Progress/Problems: Patient attended group and contributed to the discussion.  Amberli Ruegg A Lavergne Hiltunen 07/13/2018, 2:00 PM 

## 2018-07-14 LAB — HIV ANTIBODY (ROUTINE TESTING W REFLEX): HIV Screen 4th Generation wRfx: NONREACTIVE

## 2018-07-14 NOTE — Progress Notes (Addendum)
Recreation Therapy Notes  Date: 12.23.19 Time: 0930 Location: [address] Dayroom  Group Topic: Stress Management  Goal Area(s) Addresses:  Patient will verbalize importance of using healthy stress management.  Patient will identify positive emotions associated with healthy stress management.   Intervention: Stress Management  Activity :  Meditation.  LRT introduced the stress management technique of meditation.  LRT played a meditation that allowed patients to focus on the possibilities the day holds.  Patients were to follow along as the meditation played to engage in the activity.  Education:  Stress Management, Discharge Planning.   Education Outcome: Acknowledges edcuation/In group clarification offered/Needs additional education  Clinical Observations/Feedback: Pt did not attend group.     Caroll RancherMarjette Sarrinah Gardin, LRT/CTRS         Lillia AbedLindsay, Kymir Coles A 07/14/2018 11:00 AM

## 2018-07-14 NOTE — Progress Notes (Signed)
Pt made aware that she has an [facility] phone interview at 11:15AM with Shayla this morning. Pt stated that she wants to discharge on Tuesday 12/24 and plans to be picked up by her brother. Pt states that she will follow-up with [facility] and [facility] on her own. Dr. Jola Babinskilary made aware.  Walterine Amodei S. Alan RipperHolloway, MSW, LCSW Clinical Social Worker 07/14/2018 10:46 AM

## 2018-07-14 NOTE — Tx Team (Addendum)
Interdisciplinary Treatment and Diagnostic Plan Update  07/14/2018 Time of Session: [id number] Cheryl Holt MRN: [id number]  Principal Diagnosis: Cocaine-induced mood disorder (HCC)  Secondary Diagnoses: Principal Problem:   Cocaine-induced mood disorder (HCC) Active Problems:   MDD (major depressive disorder), recurrent severe, without psychosis (HCC)   Current Medications:  Current Facility-Administered Medications  Medication Dose Route Frequency Provider Last Rate Last Dose  . acetaminophen (TYLENOL) tablet 650 mg  650 mg Oral Q6H PRN Antonieta Pertlary, Greg Lawson, MD   650 mg at 07/12/18 1725  . alum & mag hydroxide-simeth (MAALOX/MYLANTA) 200-200-20 MG/5ML suspension 30 mL  30 mL Oral Q4H PRN Antonieta Pertlary, Greg Lawson, MD      . cephALEXin [facility](KEFLEX) capsule 500 mg  500 mg Oral Q8H Antonieta Pertlary, Greg Lawson, MD   500 mg at 07/14/18 [phone]  . FLUoxetine (PROZAC) capsule 40 mg  40 mg Oral Daily Aldean BakerSykes, Janet E, NP   40 mg at 07/14/18 [id number]  . gabapentin (NEURONTIN) capsule 100 mg  100 mg Oral TID Antonieta Pertlary, Greg Lawson, MD   100 mg at 07/14/18 [id number]  . hydrOXYzine (ATARAX/VISTARIL) tablet 25 mg  25 mg Oral TID PRN Antonieta Pertlary, Greg Lawson, MD   25 mg at 07/14/18 0721  . magnesium hydroxide (MILK OF MAGNESIA) suspension 30 mL  30 mL Oral Daily PRN Antonieta Pertlary, Greg Lawson, MD      . nicotine (NICODERM CQ - dosed in mg/24 hours) patch 21 mg  21 mg Transdermal Daily Antonieta Pertlary, Greg Lawson, MD   21 mg at 07/14/18 0844  . traZODone (DESYREL) tablet 50 mg  50 mg Oral QHS PRN Antonieta Pertlary, Greg Lawson, MD   50 mg at 07/13/18 2159   PTA Medications: Medications Prior to Admission  Medication Sig Dispense Refill Last Dose  . FLUoxetine (PROZAC) 40 MG capsule Take 40 mg by mouth daily.     . hydrOXYzine (ATARAX/VISTARIL) 25 MG tablet Take 1 tablet (25 mg total) by mouth at bedtime as needed for itching. (Patient not taking: Reported on 07/10/2018) 30 tablet 2 Not Taking at Unknown time  . metroNIDAZOLE (FLAGYL) 500 MG tablet Take 1 tablet (500 mg  total) by mouth 2 (two) times daily. (Patient not taking: Reported on 07/10/2018) 14 tablet 0 Completed Course at Unknown time  . NIFEdipine (PROCARDIA XL) 30 MG 24 hr tablet Take 1 tablet (30 mg total) by mouth 2 (two) times daily as needed (contractions). (Patient not taking: Reported on 07/10/2018) 30 tablet 0 Not Taking at Unknown time  . permethrin (ELIMITE) 5 % cream Apply to affected area once (Patient not taking: Reported on 07/10/2018) 60 g 2 Completed Course at Unknown time  . Prenatal Vit-Fe Fumarate-FA (PRENATAL MULTIVITAMIN) TABS tablet Take 1 tablet by mouth daily at 12 noon. (Patient not taking: Reported on 07/10/2018) 30 tablet 0 Not Taking at Unknown time    Patient Stressors: Financial difficulties Marital or family conflict Substance abuse  Patient Strengths: Ability for insight Average or above average intelligence Capable of independent living [employer] of knowledge Motivation for treatment/growth  Treatment Modalities: Medication Management, Group therapy, Case management,  1 to 1 session with clinician, Psychoeducation, Recreational therapy.   Physician Treatment Plan for Primary Diagnosis: Cocaine-induced mood disorder (HCC) Long Term Goal(s): Improvement in symptoms so as ready for discharge Improvement in symptoms so as ready for discharge   Short Term Goals: Ability to identify changes in lifestyle to reduce recurrence of condition will improve Ability to verbalize feelings will improve Ability to disclose and discuss suicidal ideas Ability to  demonstrate self-control will improve Ability to identify and develop effective coping behaviors will improve Ability to maintain clinical measurements within normal limits will improve Compliance with prescribed medications will improve Ability to identify triggers associated with substance abuse/mental health issues will improve Ability to identify changes in lifestyle to reduce recurrence of condition will  improve Ability to verbalize feelings will improve Ability to disclose and discuss suicidal ideas Ability to demonstrate self-control will improve Ability to identify and develop effective coping behaviors will improve Ability to maintain clinical measurements within normal limits will improve Compliance with prescribed medications will improve Ability to identify triggers associated with substance abuse/mental health issues will improve  Medication Management: Evaluate patient's response, side effects, and tolerance of medication regimen.  Therapeutic Interventions: 1 to 1 sessions, Unit Group sessions and Medication administration.  Evaluation of Outcomes: Progressing  Physician Treatment Plan for Secondary Diagnosis: Principal Problem:   Cocaine-induced mood disorder (HCC) Active Problems:   MDD (major depressive disorder), recurrent severe, without psychosis (HCC)  Long Term Goal(s): Improvement in symptoms so as ready for discharge Improvement in symptoms so as ready for discharge   Short Term Goals: Ability to identify changes in lifestyle to reduce recurrence of condition will improve Ability to verbalize feelings will improve Ability to disclose and discuss suicidal ideas Ability to demonstrate self-control will improve Ability to identify and develop effective coping behaviors will improve Ability to maintain clinical measurements within normal limits will improve Compliance with prescribed medications will improve Ability to identify triggers associated with substance abuse/mental health issues will improve Ability to identify changes in lifestyle to reduce recurrence of condition will improve Ability to verbalize feelings will improve Ability to disclose and discuss suicidal ideas Ability to demonstrate self-control will improve Ability to identify and develop effective coping behaviors will improve Ability to maintain clinical measurements within normal limits will  improve Compliance with prescribed medications will improve Ability to identify triggers associated with substance abuse/mental health issues will improve     Medication Management: Evaluate patient's response, side effects, and tolerance of medication regimen.  Therapeutic Interventions: 1 to 1 sessions, Unit Group sessions and Medication administration.  Evaluation of Outcomes: Progressing  RN Treatment Plan for Primary Diagnosis: Cocaine-induced mood disorder (HCC) Long Term Goal(s): Knowledge of disease and therapeutic regimen to maintain health will improve  Short Term Goals: Ability to remain free from injury will improve, Ability to demonstrate self-control, Ability to disclose and discuss suicidal ideas and Compliance with prescribed medications will improve  Medication Management: RN will administer medications as ordered by provider, will assess and evaluate patient's response and provide education to patient for prescribed medication. RN will report any adverse and/or side effects to prescribing provider.  Therapeutic Interventions: 1 on 1 counseling sessions, Psychoeducation, Medication administration, Evaluate responses to treatment, Monitor vital signs and CBGs as ordered, Perform/monitor CIWA, COWS, AIMS and Fall Risk screenings as ordered, Perform wound care treatments as ordered.  Evaluation of Outcomes: Progressing  LCSW Treatment Plan for Primary Diagnosis: Cocaine-induced mood disorder (HCC) Long Term Goal(s): Safe transition to appropriate next level of care at discharge, Engage patient in therapeutic group addressing interpersonal concerns.  Short Term Goals: Engage patient in aftercare planning with referrals and resources, Facilitate patient progression through stages of change regarding substance use diagnoses and concerns and Identify triggers associated with mental health/substance abuse issues  Therapeutic Interventions: Assess for all discharge needs, 1 to 1  time with Social worker, Explore available resources and support systems, Assess for adequacy in community  support network, Educate family and significant other(s) on suicide prevention, Complete Psychosocial Assessment, Interpersonal group therapy.  Evaluation of Outcomes: Progressing   Progress in Treatment: Attending groups: Yes Participating in groups: Intermittently  Taking medication as prescribed: Yes. Toleration medication: Yes. Family/Significant other contact made: SPE completed with pt; pt declined to consent to collateral contact.  Patient understands diagnosis: Yes. Discussing patient identified problems/goals with staff: Yes. Medical problems stabilized or resolved: Yes. Denies suicidal/homicidal ideation: Yes. Issues/concerns per patient self-inventory: No. Other: n/a   New problem(s) identified: No, Describe:  n/a  New Short Term/Long Term Goal(s): detox, medication management for mood stabilization; elimination of SI thoughts; development of comprehensive mental wellness/sobriety plan.   Patient Goals:  "I don't have a goal. I want to just give up."   Discharge Plan or Barriers: [facility] and has a [facility] screening on Friday, 1/3 at 7:45AM and plans to discharge with family on 12/24.  Pt reports that she recently relapsed on cocaine and is denying that she abused opiates or amphetamines despite having abscess in leg from shooting up and being positive for amphetamines. [facility] pamphlet, Mobile Crisis information, and AA/NA information provided to patient for additional community support and resources.   Reason for Continuation of Hospitalization: Anxiety Depression Medication stabilization  Estimated Length of Stay:  Tuesday, 07/15/18  Attendees: Patient:  07/14/2018 9:24 AM  Physician: Dr. Jola Babinskilary MD 07/14/2018 9:24 AM  Nursing: Lanora ManisElizabeth RN 07/14/2018 9:24 AM  RN Care Manager:x 07/14/2018 9:24 AM  Social Worker: Corrie MckusickHeather Calinda Stockinger LCSW 07/14/2018  9:24 AM  Recreational Therapist: x 07/14/2018 9:24 AM  Other: Marciano SequinJanet Sykes NP 07/14/2018 9:24 AM  Other:  07/14/2018 9:24 AM  Other: 07/14/2018 9:24 AM    Scribe for Treatment Team: Rona RavensHeather S Jaylen Claude, LCSW 07/14/2018 9:24 AM

## 2018-07-14 NOTE — Progress Notes (Signed)
  [facility] :  Will you be returning to the same living situation after discharge:  Yes,  home with family At discharge, do you have transportation home?: Yes,  per pt, her brother will pick her up at discharge on Tuesday, 12/14. Do you have the ability to pay for your medications: Yes,  mental health  Release of information consent forms completed and submitted to medical records by CSW.  Patient to Follow up at: Follow-up Information    [facility] Follow up.   Specialty:  Behavioral Health Why:  Your hospital follow up appointment is Monday, 07/21/18 at 9::00a. Please bring: photo ID, proof of insurance, social security card, and any discharge paperwork from this hospitalization.  Contact information: [address] [place] [address] [phone]        [facility] Follow up.   Why:  Screening for possible admission on Friday, 07/25/17 at 7:45AM. Please bring: photo ID/proof of [place] residency/hospital facesheet is acceptable, 30 day supply of all medications, and clothing. PLEASE CALL TO CANCEL IF YOU DO NOT PLAN TO ATTEND.  Contact information: [address] [place] [address] [phone]        [facility] Follow up.   Specialty:  Addiction Medicine Why:  Referral faxed: 12/20 and phone interview scheduled for 12/23 at 11:15AM. Patient plans to follow-up with [facility] on her own at discharge.  Contact information: [address] [place] [address] [phone]           Next level of care provider has access to [facility] Link:no  Safety Planning and Suicide Prevention discussed: Yes,  SPE completed with pt; pt declined to consent to collateral contact.   Have you used any form of tobacco in the last 30 days? (Cigarettes, Smokeless Tobacco, Cigars, and/or Pipes): Yes  Has patient been referred to the Quitline?: Patient refused referral  Patient has been referred for  addiction treatment: Yes  Rona RavensHeather S Chriselda Leppert, LCSW 07/14/2018, 10:49 AM

## 2018-07-14 NOTE — Progress Notes (Signed)
[facility]  07/14/2018 12:44 PM Cheryl ScotBethany Holt  MRN:  [id number]  Evaluation: Patient resting in her room during assessment. She states she is feeling better, with depression decreasing to 6/10. Denies SI, HI, AVH. She denies desire for residential drug treatment. States that she will be staying with friends of the family upon discharge and thinks this will be a good arrangement where she will be away from drugs. 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 She states she will follow-up with substance use treatment on an outpatient basis. No medication side effects or concerns reported.  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 The patient stated that she had relapsed on cocaine, and her father kicked her out of the house. [place], then [place]. Her father would not tolerate this. She had been sober for more than 3 to 4 months recently. She stated she was hanging out with the wrong crowd [place] and relapse. She is concerned that she might have bedbugs. She had a previous [facility] in [facility] under similar circumstances, and was being treated with fluoxetine, trazodone. She also had a previous overdose on Seroquel in 2016. In the [facility] emergency room she was stating that she would walk into traffic. She is seeking admission into a rehabilitation facility. She stated that her last substance rehabilitation facility admission was somewhere within the last 12 months. She was admitted to the hospital for evaluation and  stabilization.     Principal Problem: Cocaine-induced mood disorder (HCC) Diagnosis: Principal Problem:   Cocaine-induced mood disorder (HCC) Active Problems:   MDD (major depressive disorder), recurrent severe, without psychosis (HCC)  Total Time spent with patient: 15 minutes  Past Psychiatric History: See admission H&P  Past Medical History:  Past Medical History:  Diagnosis Date  . Depression   . Gestational diabetes   . Headache   . Hepatitis C 05/11/2016   [ ]  f/u quant [ ]  cmp  . Infection    UTI  . IV drug abuse (HCC)   . Polysubstance abuse [facility](HCC)     Past Surgical History:  Procedure Laterality Date  . CESAREAN SECTION    . CHOLECYSTECTOMY, LAPAROSCOPIC  2014   Family History:  Family History  Problem Relation Age of Onset  . Cancer Mother        pancreatic  . Migraines Mother   . Mental illness Father   . Epilepsy Father   . Migraines Father   . Hypertension Father    Family Psychiatric  History: See admission H&P Social History:  Social History   Substance and Sexual Activity  Alcohol Use No     Social History   Substance and Sexual Activity  Drug Use Yes  . Types: Marijuana, Cocaine   Comment: not since pregnancy    Social History   Socioeconomic History  . Marital status: Divorced    Spouse name: Not on file  . Number of children: Not on file  . Years of education: Not on file  . Highest education level: Not on file  Occupational History  . Not on file  Social Needs  .  Financial resource strain: Not on file  . Food insecurity:    Worry: Not on file    Inability: Not on file  . Transportation needs:    Medical: Not on file    Non-medical: Not on file  Tobacco Use  . Smoking status: Current Every Day Smoker    Packs/day: 0.50    Years: 10.00    Pack years: 5.00    Types: Cigarettes  . Smokeless tobacco: Never Used  Substance and Sexual Activity  . Alcohol use: No  . Drug use: Yes    Types: Marijuana, Cocaine    Comment:  not since pregnancy  . Sexual activity: Not on file  Lifestyle  . Physical activity:    Days per week: Not on file    Minutes per session: Not on file  . Stress: Not on file  Relationships  . Social connections:    Talks on phone: Not on file    Gets together: Not on file    Attends religious service: Not on file    Active member of club or organization: Not on file    Attends meetings of clubs or organizations: Not on file    Relationship status: Not on file  Other Topics Concern  . Not on file  Social History Narrative  . Not on file   Additional Social History:        Sleep: Good  Appetite:  Good  Current Medications: Current Facility-Administered Medications  Medication Dose Route Frequency Provider Last Rate Last Dose  . acetaminophen (TYLENOL) tablet 650 mg  650 mg Oral Q6H PRN Antonieta Pertlary, Greg Lawson, MD   650 mg at 07/14/18 1202  . alum & mag hydroxide-simeth (MAALOX/MYLANTA) 200-200-20 MG/5ML suspension 30 mL  30 mL Oral Q4H PRN Antonieta Pertlary, Greg Lawson, MD      . cephALEXin [facility](KEFLEX) capsule 500 mg  500 mg Oral Q8H Antonieta Pertlary, Greg Lawson, MD   500 mg at 07/14/18 [phone]  . FLUoxetine (PROZAC) capsule 40 mg  40 mg Oral Daily Aldean BakerSykes, Khamil Lamica E, NP   40 mg at 07/14/18 [phone]  . gabapentin (NEURONTIN) capsule 100 mg  100 mg Oral TID Antonieta Pertlary, Greg Lawson, MD   100 mg at 07/14/18 1202  . hydrOXYzine (ATARAX/VISTARIL) tablet 25 mg  25 mg Oral TID PRN Antonieta Pertlary, Greg Lawson, MD   25 mg at 07/14/18 0721  . magnesium hydroxide (MILK OF MAGNESIA) suspension 30 mL  30 mL Oral Daily PRN Antonieta Pertlary, Greg Lawson, MD      . nicotine (NICODERM CQ - dosed in mg/24 hours) patch 21 mg  21 mg Transdermal Daily Antonieta Pertlary, Greg Lawson, MD   21 mg at 07/14/18 0844  . traZODone (DESYREL) tablet 50 mg  50 mg Oral QHS PRN Antonieta Pertlary, Greg Lawson, MD   50 mg at 07/13/18 2159    Lab Results:  Results for orders placed or performed during the hospital encounter of 07/09/18 (from the past 48 hour(s))  Ammonia     Status: Abnormal    Collection Time: 07/13/18  6:45 PM  Result Value Ref Range   Ammonia 39 (H) 9 - 35 umol/L    Comment: Performed at [facility], [address] [place], [place] [address]  HIV Antibody (routine testing w rflx)     Status: None   Collection Time: 07/13/18  6:45 PM  Result Value Ref Range   HIV Screen 4th Generation wRfx Non Reactive Non Reactive    Comment: (NOTE) Performed At: [facility]  [address] [place], [place] [address] Jolene Schimke MD WU:[id number]     Blood Alcohol level:  Lab Results  Component Value Date   ETH <10 05/20/2017   ETH <5 01/17/2016    Metabolic Disorder Labs: Lab Results  Component Value Date   HGBA1C 5.4 09/16/2017   MPG 91 05/10/2016   No results found for: PROLACTIN No results found for: CHOL, TRIG, HDL, CHOLHDL, VLDL, LDLCALC  Physical Findings: AIMS: Facial and Oral Movements Muscles of Facial Expression: None, normal Lips and Perioral Area: None, normal Jaw: None, normal Tongue: None, normal,Extremity Movements Upper (arms, wrists, hands, fingers): None, normal Lower (legs, knees, ankles, toes): None, normal, Trunk Movements Neck, shoulders, hips: None, normal, Overall Severity Severity of abnormal movements (highest score from questions above): None, normal Incapacitation due to abnormal movements: None, normal Patient's awareness of abnormal movements (rate only patient's report): No Awareness, Dental Status Current problems with teeth and/or dentures?: No Does patient usually wear dentures?: No  CIWA:    COWS:     Musculoskeletal: Strength & Muscle Tone: within normal limits Gait & Station: normal Patient leans: N/A  Psychiatric Specialty Exam: Physical Exam  Nursing note and vitals reviewed. Constitutional: She is oriented to person, place, and time. She appears well-developed.  HENT:  Head: Normocephalic.  Respiratory: Effort normal.  Musculoskeletal: Normal range of motion.   Neurological: She is alert and oriented to person, place, and time.    Review of Systems  Psychiatric/Behavioral: Positive for depression (improving) and substance abuse (hx cocaine, heroin). Negative for hallucinations, memory loss and suicidal ideas. The patient is not nervous/anxious and does not have insomnia.     Blood pressure 108/71, pulse 98, temperature 98 F (36.7 C), temperature source Oral, resp. rate 20, height 5\' 5"  (1.651 m), weight 97.1 kg, SpO2 100 %, unknown if currently breastfeeding.Body mass index is 35.61 kg/m.  General Appearance: Disheveled  Eye Contact:  Fair  Speech:  Clear and Coherent  Volume:  Normal  Mood:  Euthymic  Affect:  Congruent  Thought Process:  Coherent  Orientation:  Full (Time, Place, and Person)  Thought Content:  WDL  Suicidal Thoughts:  No  Homicidal Thoughts:  No  Memory:  Immediate;   Good  Judgement:  Fair  Insight:  Fair  Psychomotor Activity:  Normal  Concentration:  Concentration: Good  Recall:  Good  Fund of Knowledge:  Fair  Language:  Good  Akathisia:  No  Handed:  Right  AIMS (if indicated):     Assets:  Communication Skills Desire for Improvement Social Support  ADL's:  Intact  Cognition:  WNL  Sleep:  Number of Hours: 6.75     Treatment Plan Summary: Daily contact with patient to assess and evaluate symptoms and progress in treatment and Medication management  Mood: Continue Prozac 40 mg PO daily  Anxiety: Continue Vistaril 25 mg PO Q8HR PRN anxiety  Sleep: Continue trazodone 50 mg QHS PRN sleep  Nicotine dependence Continue nicotine 21 mg patch transdermal daily  Other medical: Continue Keflex 500 mg PO Q8HR  Patient will participate in the therapeutic group milieu.  Discharge disposition in progress  Aldean Baker, NP 07/14/2018, 12:44 PM

## 2018-07-14 NOTE — Progress Notes (Signed)
Pt has been in her room in bed all evening.  She did get up just before bedtime to get a snack.  She reports she has not felt well today and chose to remain in bed most of the day.  [facility], so she has been inpt for a few days.  She did not give specifics as to why she did not feel well.  Conversation was minimal.  Pt denies SI/HI/AVH.  She voiced no needs or concerns this evening.  She took her meds without incident.  Support and encouragement offered.  Discharge plans are in process.  Safety maintained with q15 minute checks.

## 2018-07-14 NOTE — Progress Notes (Signed)
Pt has [facility] scheduled for Friday, 1/3 at 7:45AM and is currently in [facility].  Reiko Vinje S. Alan RipperHolloway, MSW, LCSW Clinical Social Worker 07/14/2018 9:27 AM

## 2018-07-14 NOTE — Plan of Care (Signed)
Nurse discussed anxiety, depression and coping skills with patient.  

## 2018-07-14 NOTE — Progress Notes (Signed)
D:  Patient denied SI and HI, contracts for safety.  Denied A/V hallucinations.  Denied pain. A:  Medications administered per MD orders.  Emotional support and encouragement given patient. R:  Safety maintained with 15 minute checks.  

## 2018-07-15 MED ORDER — GABAPENTIN 100 MG PO CAPS: 100.0000 mg | ORAL_CAPSULE | Freq: Three times a day (TID) | ORAL | 2 refills | Status: AC

## 2018-07-15 MED ORDER — CEPHALEXIN 500 MG PO CAPS: 500.0000 mg | ORAL_CAPSULE | Freq: Three times a day (TID) | ORAL | 0 refills | Status: AC

## 2018-07-15 MED ORDER — HYDROXYZINE HCL 25 MG PO TABS: 25.0000 mg | ORAL_TABLET | Freq: Three times a day (TID) | ORAL | 0 refills | Status: AC | PRN

## 2018-07-15 MED ORDER — FLUOXETINE HCL 40 MG PO CAPS: 40.0000 mg | ORAL_CAPSULE | Freq: Every day | ORAL | 1 refills | Status: AC

## 2018-07-15 MED ORDER — PRENATAL 27-0.8 MG PO TABS
1.0000 | ORAL_TABLET | Freq: Every day | ORAL | Status: DC
  Filled 2018-07-15: qty 7

## 2018-07-15 NOTE — Progress Notes (Signed)
Recreation Therapy Notes  Animal-Assisted Activity (AAA) Program Checklist/Progress Notes Patient Eligibility Criteria Checklist & Daily Group note for Rec Tx Intervention  Date: 12.24.19 Time: 1100 [address]   AAA/T Program Assumption of Risk Form signed by Engineer, productionatient/ or Parent Legal Guardian  YES   Patient is free of allergies or sever asthma  YES   Patient reports no fear of animals  YES   Patient reports no history of cruelty to animals  YES   Patient understands his/her participation is voluntary  YES   Patient washes hands before animal contact  YES   Patient washes hands after animal contact  YES   Education: Charity fundraiserHand Washing, Appropriate Animal Interaction   Education Outcome: Acknowledges understanding/In group clarification offered/Needs additional education.   Clinical Observations/Feedback: Pt did not attend group.    Caroll RancherMarjette Mykale Gandolfo, LRT/CTRS         Lillia AbedLindsay, Laural Eiland A 07/15/2018 12:00 PM

## 2018-07-15 NOTE — Discharge Summary (Signed)
Physician Discharge Summary Note  Patient:  Cheryl Holt is an 27 y.o., female MRN:  [id number] DOB:  June 21, 1991 Patient phone:  [phone] (home)  Patient address:   [place] [address],  Total Time spent with patient: 45 minutes  Date of Admission:  07/09/2018 Date of Discharge: 07/15/18  Reason for Admission:   Cheryl Holt is 27 years of age required admission basically for depressive symptoms, threats of suicide and abuse of cocaine and history of heroin as well she has presented with drug screens consistently positive for cannabis and intermittently positive for cocaine and opiates.  See the admission note  Principal Problem: Cocaine-induced mood disorder [facility]) Discharge Diagnoses: Principal Problem:   Cocaine-induced mood disorder (HCC) Active Problems:   MDD (major depressive disorder), recurrent severe, without psychosis (HCC)  Past Medical History:  Past Medical History:  Diagnosis Date  . Depression   . Gestational diabetes   . Headache   . Hepatitis C 05/11/2016   [ ]  f/u quant [ ]  cmp  . Infection    UTI  . IV drug abuse (HCC)   . Polysubstance abuse [facility])     Past Surgical History:  Procedure Laterality Date  . CESAREAN SECTION    . CHOLECYSTECTOMY, LAPAROSCOPIC  2014   Family History:  Family History  Problem Relation Age of Onset  . Cancer Mother        pancreatic  . Migraines Mother   . Mental illness Father   . Epilepsy Father   . Migraines Father   . Hypertension Father     Social History:  Social History   Substance and Sexual Activity  Alcohol Use No     Social History   Substance and Sexual Activity  Drug Use Yes  . Types: Marijuana, Cocaine   Comment: not since pregnancy    Social History   Socioeconomic History  . Marital status: Divorced    Spouse name: Not on file  . Number of children: Not on file  . Years of education: Not on file  . Highest education level: Not on file  Occupational History  . Not on file   Social Needs  . Financial resource strain: Not on file  . Food insecurity:    Worry: Not on file    Inability: Not on file  . Transportation needs:    Medical: Not on file    Non-medical: Not on file  Tobacco Use  . Smoking status: Current Every Day Smoker    Packs/day: 0.50    Years: 10.00    Pack years: 5.00    Types: Cigarettes  . Smokeless tobacco: Never Used  Substance and Sexual Activity  . Alcohol use: No  . Drug use: Yes    Types: Marijuana, Cocaine    Comment: not since pregnancy  . Sexual activity: Not on file  Lifestyle  . Physical activity:    Days per week: Not on file    Minutes per session: Not on file  . Stress: Not on file  Relationships  . Social connections:    Talks on phone: Not on file    Gets together: Not on file    Attends religious service: Not on file    Active member of club or organization: Not on file    Attends meetings of clubs or organizations: Not on file    Relationship status: Not on file  Other Topics Concern  . Not on file  Social History Narrative  . Not on file  Hospital Course:   Once here she remained generally cooperative pleasant displayed no dangerous behaviors.  Suicidal thoughts resolved.  She displayed no cravings tremors or withdrawal symptoms by the date of the 24th and was requesting discharge home.  Again no thoughts of harming self or others and did seem to have recalibrated to her baseline as far as mood, and of course is told abstain from all illicit compounds.  Medication adjustments included the Keflex for vaginitis and the continuation of fluoxetine, prenatal vitamins, and she declined medications for cravings  Physical Findings: AIMS: Facial and Oral Movements Muscles of Facial Expression: None, normal Lips and Perioral Area: None, normal Jaw: None, normal Tongue: None, normal,Extremity Movements Upper (arms, wrists, hands, fingers): None, normal Lower (legs, knees, ankles, toes): None, normal, Trunk  Movements Neck, shoulders, hips: None, normal, Overall Severity Severity of abnormal movements (highest score from questions above): None, normal Incapacitation due to abnormal movements: None, normal Patient's awareness of abnormal movements (rate only patient's report): No Awareness, Dental Status Current problems with teeth and/or dentures?: No Does patient usually wear dentures?: No  CIWA:  CIWA-Ar Total: 1 COWS:  COWS Total Score: 2  Musculoskeletal: Strength & Muscle Tone: within normal limits Gait & Station: normal Patient leans: N/A  Psychiatric Specialty Exam: Physical Exam  ROS  Blood pressure (!) 96/51, pulse (!) 104, temperature 98 F (36.7 C), temperature source Oral, resp. rate 16, height 5\' 5"  (1.651 m), weight 97.1 kg, SpO2 100 %, unknown if currently breastfeeding.Body mass index is 35.61 kg/m.  General Appearance: Casual  Eye Contact:  Good  Speech:  Clear and Coherent  Volume:  Normal  Mood:  Euthymic  Affect:  Appropriate  Thought Process:  Coherent  Orientation:  Full (Time, Place, and Person)  Thought Content:  Logical  Suicidal Thoughts:  No  Homicidal Thoughts:  No  Memory:  NA  Judgement:  Good  Insight:  Good  Psychomotor Activity:  Normal  Concentration:  Attention Span: Good  Recall:  Good  Fund of Knowledge:  Good  Language:  Good  Akathisia:  Negative  Handed:  Right  AIMS (if indicated):     Assets:  Communication Skills  ADL's:  Intact  Cognition:  WNL  Sleep:  Number of Hours: 6.75     Have you used any form of tobacco in the last 30 days? (Cigarettes, Smokeless Tobacco, Cigars, and/or Pipes): Yes  Has this patient used any form of tobacco in the last 30 days? (Cigarettes, Smokeless Tobacco, Cigars, and/or Pipes) Yes, No  Blood Alcohol level:  Lab Results  Component Value Date   ETH <10 05/20/2017   ETH <5 01/17/2016    Metabolic Disorder Labs:  Lab Results  Component Value Date   HGBA1C 5.4 09/16/2017   MPG 91  05/10/2016   No results found for: PROLACTIN No results found for: CHOL, TRIG, HDL, CHOLHDL, VLDL, LDLCALC  See Psychiatric Specialty Exam and Suicide Risk Assessment completed by Attending Physician prior to discharge.  Discharge destination:  Home  Is patient on multiple antipsychotic therapies at discharge:  No   Has Patient had three or more failed trials of antipsychotic monotherapy by history:  No  Recommended Plan for Multiple Antipsychotic Therapies: NA   Allergies as of 07/15/2018      Reactions   Nsaids Itching      Medication List    STOP taking these medications   metroNIDAZOLE 500 MG tablet Commonly known as:  FLAGYL     TAKE these  medications     Indication  cephALEXin 500 MG capsule Commonly known as:  KEFLEX Take 1 capsule (500 mg total) by mouth every 8 (eight) hours for 4 days.  Indication:  Middle Ear Inflammation   FLUoxetine 40 MG capsule Commonly known as:  PROZAC Take 1 capsule (40 mg total) by mouth daily.  Indication:  Depression   gabapentin 100 MG capsule Commonly known as:  NEURONTIN Take 1 capsule (100 mg total) by mouth 3 (three) times daily.  Indication:  Neuropathic Pain   hydrOXYzine 25 MG tablet Commonly known as:  ATARAX/VISTARIL Take 1 tablet (25 mg total) by mouth 3 (three) times daily as needed for itching, anxiety or nausea. What changed:    when to take this  reasons to take this  Indication:  Feeling Anxious   NIFEdipine 30 MG 24 hr tablet Commonly known as:  PROCARDIA XL Take 1 tablet (30 mg total) by mouth 2 (two) times daily as needed (contractions).  Indication:  High Blood Pressure Disorder   permethrin 5 % cream Commonly known as:  ELIMITE Apply to affected area once  Indication:  Rosacea   prenatal multivitamin Tabs tablet Take 1 tablet by mouth daily at 12 noon.  Indication:  Vitamin Deficiency      Follow-up Information    [facility] Follow up.   Specialty:  Behavioral Health Why:  Your hospital  follow up appointment is Monday, 07/21/18 at 9::00a. Please bring: photo ID, proof of insurance, social security card, and any discharge paperwork from this hospitalization.  Contact information: [address] [place] [address] [phone]        [facility] Follow up.   Why:  Screening for possible admission on Friday, 07/25/17 at 7:45AM. Please bring: photo ID/proof of [place] residency/hospital facesheet is acceptable, 30 day supply of all medications, and clothing. PLEASE CALL TO CANCEL IF YOU DO NOT PLAN TO ATTEND.  Contact information: [address] [address] [place] [address] [phone]        [facility] Follow up.   Specialty:  Addiction Medicine Why:  Referral faxed: 12/20 and phone interview scheduled for 12/23 at 11:15AM. Patient plans to follow-up with [facility] on her own at discharge.  Contact information: [address] [place] [address] [phone]           Follow-up recommendations:  Activity:  full  Comments:   Stable for discharge from diagnosis depression recurrent severe without psychosis/polysubstance abuse  Signed: Malvin JohnsFARAH,Oney Tatlock, MD 07/15/2018, 8:42 AM

## 2018-07-15 NOTE — [facility] Suicide Risk Assessment (Signed)
[facility]   Principal Problem: Cocaine-induced mood disorder [facility](HCC) Discharge Diagnoses: Principal Problem:   Cocaine-induced mood disorder (HCC) Active Problems:   MDD (major depressive disorder), recurrent severe, without psychosis (HCC)   Total Time spent with patient: 45 minutes  Patient seen case discussed with team she is alert oriented to person place time situation eager for discharge denies wanting to harm self or others no cravings tremors withdrawal no psychosis  Mental Status Per Nursing Assessment::   On Admission:  Suicidal ideation indicated by patient, Self-harm thoughts  Demographic Factors:  Caucasian  Loss Factors: NA  Historical Factors: NA  Risk Reduction Factors:   Sense of responsibility to family and Religious beliefs about death  Continued Clinical Symptoms:  Depression:   Severe Alcohol/Substance Abuse/Dependencies  Cognitive Features That Contribute To Risk:  None    Suicide Risk:  Minimal: No identifiable suicidal ideation.  Patients presenting with no risk factors but with morbid ruminations; may be classified as minimal risk based on the severity of the depressive symptoms  Follow-up Information    [facility] Follow up.   Specialty:  Behavioral Health Why:  Your hospital follow up appointment is Monday, 07/21/18 at 9::00a. Please bring: photo ID, proof of insurance, social security card, and any discharge paperwork from this hospitalization.  Contact information: [address] [place] [address] [phone]        [facility] Follow up.   Why:  Screening for possible admission on Friday, 07/25/17 at 7:45AM. Please bring: photo ID/proof of [place] residency/hospital facesheet is acceptable, 30 day supply of all medications, and clothing. PLEASE CALL TO CANCEL IF YOU DO NOT PLAN TO ATTEND.  Contact information: [address] [place] [address] [phone]        [facility] Follow [facility].   Specialty:  Addiction Medicine Why:  Referral faxed: 12/20 and phone interview scheduled for 12/23 at 11:15AM. Patient plans to follow-up with [facility] on her own at discharge.  Contact information: [address] [place] [address] [phone]           Plan Of Care/Follow-up recommendations:  Activity:  full  Rashonda Warrior, MD 07/15/2018, 8:39 AM

## 2018-07-15 NOTE — Progress Notes (Addendum)
D: Pt A & O X 4. Denies SI, HI, AVH and pain at this time. Presents anxious on interactions; "I'm ready to go home, I've been ready since yesterday". Pt d/c home as ordered. Bus pass given for transportation. A: D/C instructions reviewed with pt including prescriptions, medication samples and follow up appointments; compliance encouraged. All belongings from locker # 36 given to pt at time of departure. Scheduled medications given with verbal education and effects monitored. Safety checks maintained without incident till time of d/c.  R: Pt receptive to care. Compliant with medications when offered. Denies adverse drug reactions when assessed. Verbalized understanding related to d/c instructions. Signed belonging sheet in agreement with items received from locker. Ambulatory with a steady gait. Appears to be in no physical distress at time of departure.

## 2018-07-18 ENCOUNTER — Encounter (HOSPITAL_COMMUNITY): Payer: Self-pay | Admitting: Emergency Medicine

## 2018-07-18 ENCOUNTER — Emergency Department (HOSPITAL_COMMUNITY)
Admission: EM | Admit: 2018-07-18 | Discharge: 2018-07-18 | Disposition: A | Discharge status: Home / Self Care | Payer: Medicaid Other | Attending: Emergency Medicine | Admitting: Emergency Medicine

## 2018-07-18 ENCOUNTER — Other Ambulatory Visit: Payer: Self-pay

## 2018-07-18 DIAGNOSIS — F17210 Nicotine dependence, cigarettes, uncomplicated: Secondary | ICD-10-CM | POA: Insufficient documentation

## 2018-07-18 DIAGNOSIS — L259 Unspecified contact dermatitis, unspecified cause: Secondary | ICD-10-CM | POA: Insufficient documentation

## 2018-07-18 DIAGNOSIS — Z79899 Other long term (current) drug therapy: Secondary | ICD-10-CM | POA: Insufficient documentation

## 2018-07-18 DIAGNOSIS — M25551 Pain in right hip: Secondary | ICD-10-CM | POA: Insufficient documentation

## 2018-07-18 MED ORDER — DIPHENHYDRAMINE HCL 25 MG PO TABS: 25.0000 mg | ORAL_TABLET | Freq: Four times a day (QID) | ORAL | 0 refills | Status: AC

## 2018-07-18 MED ORDER — DIPHENHYDRAMINE HCL 25 MG PO CAPS
25.0000 mg | ORAL_CAPSULE | Freq: Once | ORAL | Status: AC
  Administered 2018-07-18: 25 mg via ORAL
  Filled 2018-07-18: qty 1

## 2018-07-18 MED ORDER — PERMETHRIN 5 % EX CREA: TOPICAL_CREAM | CUTANEOUS | 2 refills | Status: AC

## 2018-07-18 NOTE — ED Notes (Signed)
Pt adds that she fell at the hotel this morning when she called the ambulance and c/o right hip pain. Pt ambulatory to restroom

## 2018-07-18 NOTE — ED Triage Notes (Signed)
Per [facility] pt comes from [facility] for drug use (meth and heroin) for several days. Reported to EMS that she relapsed couple weeks ago.  Pt thinks that she has scabies.

## 2018-07-18 NOTE — Discharge Instructions (Addendum)
Here are the general ways to use permethrin cream for scabies: Take a bath and dry yourself  Use a thin layer of the cream and apply it your skin from neck down to your toes.  Cover hairline, temples, forehead, underneath nails and skin folds.  A single tube of cream can cover your body Leave the cream on your skin for 8-14 hours  After that duration, rinse off the cream by taking bath (Note: You may feel itching after applying cream. Dont worry as it is a common symptom)

## 2018-07-18 NOTE — ED Provider Notes (Signed)
[facility]-EMERGENCY DEPT Provider Note   CSN: [id number] Arrival date & time: 07/18/18  [id number]     History   Chief Complaint Chief Complaint  Patient presents with  . Addiction Problem  . scabies  . Fall  . Hip Pain    HPI Marcelino ScotBethany Osika is a 27 y.o. female.  HPI Patient is a 27 year old female presents the emergency department with complaints of itching rash to her arms and legs which began this evening.  She believes it may be scabies.  She states the areas are irritated and itch.  No fevers or chills.  She also reports falling last night and injuring her right hip.  She is able to ambulate on the hip.  She reports paresthesias of her right lower extremity from the knee down.  She has no other significant complaints at this time.  She has not tried any medication prior to arrival.  No history of allergic reactions or eczema.   Past Medical History:  Diagnosis Date  . Depression   . Gestational diabetes   . Headache   . Hepatitis C 05/11/2016   [ ]  f/u quant [ ]  cmp  . Infection    UTI  . IV drug abuse (HCC)   . Polysubstance abuse [facility](HCC)     Patient Active Problem List   Diagnosis Date Noted  . MDD (major depressive disorder), recurrent severe, without psychosis (HCC) 07/09/2018  . 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 Pregnancy complicated by subutex maintenance, antepartum (HCC) 09/18/2017  . Dichorionic diamniotic twin pregnancy in third trimester 09/18/2017  . Chronic viral hepatitis affecting pregnancy in third trimester (HCC) 09/18/2017  . Preterm labor 09/16/2017  . Left breast abscess 07/25/2017  . Supervision of high-risk pregnancy 05/24/2016  . Hepatitis C 05/11/2016  . Obesity in pregnancy 05/10/2016  . Rh negative status during pregnancy 04/04/2016  . History of gestational diabetes mellitus 03/27/2016  . Substance abuse complicating pregnancy in third trimester, antepartum 02/26/2016  . Anxiety and depression  02/26/2016  . Cannabis use disorder, moderate, dependence (HCC) 01/18/2016  . 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 Tobacco use disorder 01/18/2016  . Opioid use disorder, severe, in sustained remission (HCC) 01/18/2016  . 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 CESAREAN SECTION    . CHOLECYSTECTOMY, LAPAROSCOPIC  2014     OB History    Gravida  5   Para  2   Term  2   Preterm  0   AB  2   Living  2     SAB  0   TAB  2   Ectopic      Multiple      Live Births  1            Home Medications    Prior to Admission medications   Medication Sig Start Date End Date Taking? Authorizing Provider  cephALEXin (KEFLEX) 500 MG capsule Take 1 capsule (500 mg total) by mouth every 8 (eight) hours for 4 days. 07/15/18 07/19/18  Malvin JohnsFarah, Brian, MD  diphenhydrAMINE (BENADRYL) 25 MG tablet Take 1 tablet (25 mg total) by mouth every 6 (six) hours. 07/18/18   Azalia Bilisampos, Jacorion Klem, MD  FLUoxetine (PROZAC) 40 MG capsule Take 1 capsule (40 mg total) by mouth daily. 07/15/18   Malvin JohnsFarah, Brian, MD  gabapentin (NEURONTIN) 100 MG capsule Take 1 capsule (100 mg  total) by mouth 3 (three) times daily. 07/15/18   Malvin JohnsFarah, Brian, MD  hydrOXYzine (ATARAX/VISTARIL) 25 MG tablet Take 1 tablet (25 mg total) by mouth 3 (three) times daily as needed for itching, anxiety or nausea. 07/15/18   Malvin JohnsFarah, Brian, MD  NIFEdipine (PROCARDIA XL) 30 MG 24 hr tablet Take 1 tablet (30 mg total) by mouth 2 (two) times daily as needed (contractions). Patient not taking: Reported on 07/10/2018 09/25/17   Judeth HornLawrence, Erin, NP  permethrin Verner Mould(ELIMITE) 5 % cream Apply to affected area once 07/18/18   Azalia Bilisampos, Kandice Schmelter, MD  Prenatal Vit-Fe Fumarate-FA (PRENATAL MULTIVITAMIN) TABS tablet Take 1 tablet by mouth daily at 12 noon. Patient not taking: Reported on 07/10/2018 05/20/17   Devoria AlbeKnapp, Iva, MD     Family History Family History  Problem Relation Age of Onset  . Cancer Mother        pancreatic  . Migraines Mother   . Mental illness Father   . Epilepsy Father   . Migraines Father   . Hypertension Father     Social History Social History   Tobacco Use  . Smoking status: Current Every Day Smoker    Packs/day: 0.50    Years: 10.00    Pack years: 5.00    Types: Cigarettes  . Smokeless tobacco: Never Used  Substance Use Topics  . Alcohol use: No  . Drug use: Yes    Types: Marijuana, Cocaine, Methamphetamines     Allergies   Nsaids   Review of Systems Review of Systems  All other systems reviewed and are negative.    Physical Exam Updated Vital Signs BP 104/76 (BP Location: Left Arm)   Pulse (!) 107   Temp 98.3 F (36.8 C) (Oral)   Resp 19   Wt 99.8 kg   LMP 07/18/2018   SpO2 98%   BMI 36.61 kg/m   Physical Exam Vitals signs and nursing note reviewed.  Constitutional:      Appearance: She is well-developed.  HENT:     Head: Normocephalic.  Neck:     Musculoskeletal: Normal range of motion.  Pulmonary:     Effort: Pulmonary effort is normal.  Abdominal:     General: There is no distension.  Musculoskeletal: Normal range of motion.     Comments: Full range of motion of bilateral hips, knees, ankles.  Normal pulses right foot.  Ambulatory in the ER  Skin:    Comments: Excoriated nonspecific rash of her arms and legs.  Neurological:     Mental Status: She is alert and oriented to person, place, and time.      ED Treatments / Results  Labs (all labs ordered are listed, but only abnormal results are displayed) Labs Reviewed - No data to display  EKG None  Radiology No results found.  Procedures Procedures (including critical care time)  Medications Ordered in ED Medications  diphenhydrAMINE (BENADRYL) capsule 25 mg (has no administration in time range)     Initial Impression / Assessment and Plan / ED Course  I have reviewed  the triage vital signs and the nursing notes.  Pertinent labs & imaging results that were available during my care of the patient were reviewed by me and considered in my medical decision making (see chart for details).     Recommended Benadryl for possible contact dermatitis.  Patient requesting prescription for permethrin cream.  This was prescribed.  This could represent scabies,   Final Clinical Impressions(s) / ED Diagnoses   Final diagnoses:  Right  hip pain  Contact dermatitis, unspecified contact dermatitis type, unspecified trigger    ED Discharge Orders         Ordered    diphenhydrAMINE (BENADRYL) 25 MG tablet  Every 6 hours     07/18/18 0831    permethrin (ELIMITE) 5 % cream     07/18/18 0831           Azalia Bilis, MD 07/18/18 [phone]

## 2018-07-18 NOTE — ED Notes (Signed)
Bed: WLPT4 Expected date:  Expected time:  Means of arrival:  Comments: 

## 2018-08-12 ENCOUNTER — Encounter (HOSPITAL_COMMUNITY): Payer: Self-pay | Admitting: Emergency Medicine

## 2018-08-12 ENCOUNTER — Emergency Department (HOSPITAL_COMMUNITY)
Admission: EM | Admit: 2018-08-12 | Discharge: 2018-08-13 | Disposition: A | Discharge status: Home / Self Care | Payer: Self-pay | Attending: Emergency Medicine | Admitting: Emergency Medicine

## 2018-08-12 DIAGNOSIS — F1494 Cocaine use, unspecified with cocaine-induced mood disorder: Secondary | ICD-10-CM | POA: Diagnosis present

## 2018-08-12 DIAGNOSIS — B86 Scabies: Secondary | ICD-10-CM | POA: Insufficient documentation

## 2018-08-12 DIAGNOSIS — R45851 Suicidal ideations: Secondary | ICD-10-CM | POA: Insufficient documentation

## 2018-08-12 DIAGNOSIS — Z79899 Other long term (current) drug therapy: Secondary | ICD-10-CM | POA: Insufficient documentation

## 2018-08-12 DIAGNOSIS — F17210 Nicotine dependence, cigarettes, uncomplicated: Secondary | ICD-10-CM | POA: Insufficient documentation

## 2018-08-12 DIAGNOSIS — F332 Major depressive disorder, recurrent severe without psychotic features: Secondary | ICD-10-CM | POA: Insufficient documentation

## 2018-08-12 DIAGNOSIS — F1120 Opioid dependence, uncomplicated: Secondary | ICD-10-CM | POA: Diagnosis present

## 2018-08-12 DIAGNOSIS — F1121 Opioid dependence, in remission: Secondary | ICD-10-CM | POA: Diagnosis present

## 2018-08-12 LAB — CBC WITH DIFFERENTIAL/PLATELET
Abs Immature Granulocytes: 0.04 10*3/uL (ref 0.00–0.07)
Basophils Absolute: 0 10*3/uL (ref 0.0–0.1)
Basophils Relative: 0 %
Eosinophils Absolute: 0.1 10*3/uL (ref 0.0–0.5)
Eosinophils Relative: 1 %
HCT: 42.8 % (ref 36.0–46.0)
HEMOGLOBIN: 13.4 g/dL (ref 12.0–15.0)
Immature Granulocytes: 0 %
Lymphocytes Relative: 15 %
Lymphs Abs: 1.4 10*3/uL (ref 0.7–4.0)
MCH: 29.4 pg (ref 26.0–34.0)
MCHC: 31.3 g/dL (ref 30.0–36.0)
MCV: 93.9 fL (ref 80.0–100.0)
MONO ABS: 0.4 10*3/uL (ref 0.1–1.0)
Monocytes Relative: 5 %
Neutro Abs: 7.2 10*3/uL (ref 1.7–7.7)
Neutrophils Relative %: 79 %
Platelets: 279 10*3/uL (ref 150–400)
RBC: 4.56 MIL/uL (ref 3.87–5.11)
RDW: 12.8 % (ref 11.5–15.5)
WBC: 9.1 10*3/uL (ref 4.0–10.5)
nRBC: 0 % (ref 0.0–0.2)

## 2018-08-12 LAB — BASIC METABOLIC PANEL
Anion gap: 7 (ref 5–15)
BUN: 10 mg/dL (ref 6–20)
CO2: 24 mmol/L (ref 22–32)
CREATININE: 0.54 mg/dL (ref 0.44–1.00)
Calcium: 9.2 mg/dL (ref 8.9–10.3)
Chloride: 105 mmol/L (ref 98–111)
GFR calc Af Amer: >60 mL/min (ref >60)
GFR calc non Af Amer: >60 mL/min (ref >60)
Glucose, Bld: 91 mg/dL (ref 70–99)
Potassium: 4.3 mmol/L (ref 3.5–5.1)
Sodium: 136 mmol/L (ref 135–145)

## 2018-08-12 LAB — ETHANOL: Alcohol, Ethyl (B): <10 mg/dL (ref <10)

## 2018-08-12 LAB — RAPID URINE DRUG SCREEN, HOSP PERFORMED
Amphetamines: NOT DETECTED
BARBITURATES: NOT DETECTED
BENZODIAZEPINES: NOT DETECTED
COCAINE: POSITIVE — AB
OPIATES: POSITIVE — AB
Tetrahydrocannabinol: NOT DETECTED

## 2018-08-12 LAB — I-STAT BETA HCG BLOOD, ED (MC, WL, AP ONLY): I-stat hCG, quantitative: <5 m[IU]/mL (ref <5)

## 2018-08-12 LAB — SALICYLATE LEVEL: Salicylate Lvl: <7 mg/dL (ref 2.8–30.0)

## 2018-08-12 LAB — ACETAMINOPHEN LEVEL: Acetaminophen (Tylenol), Serum: <10 ug/mL — ABNORMAL LOW (ref 10–30)

## 2018-08-12 MED ORDER — PERMETHRIN 5 % EX CREA
TOPICAL_CREAM | Freq: Once | CUTANEOUS | Status: AC
  Administered 2018-08-12: 16:00:00 via TOPICAL
  Filled 2018-08-12: qty 60

## 2018-08-12 MED ORDER — NICOTINE 21 MG/24HR TD PT24
21.0000 mg | MEDICATED_PATCH | Freq: Once | TRANSDERMAL | Status: AC
  Administered 2018-08-12: 21 mg via TRANSDERMAL
  Filled 2018-08-12: qty 1

## 2018-08-12 NOTE — ED Notes (Signed)
Informed by ED Charge- Stacey,RN to move pt to Secure Area

## 2018-08-12 NOTE — ED Notes (Signed)
Pt alert and cooperative. Pt denies any pain or discomfort at this time. Pt denies any si,hi ,or avh. Pt sleeping in bed. Pt safe, will continue to monitor.

## 2018-08-12 NOTE — ED Notes (Signed)
Admitted to room 31 due to her having scabies and on TCU she is able to be contained in her room and has access to her own private bathroom. She is not to come out of her room and interact with her peers or staff and she is understanding of this.

## 2018-08-12 NOTE — ED Notes (Signed)
Bed: WBH43 Expected date:  Expected time:  Means of arrival:  Comments: 

## 2018-08-12 NOTE — ED Notes (Signed)
Bed: WA21 Expected date:  Expected time:  Means of arrival:  Comments: rm 43

## 2018-08-12 NOTE — ED Notes (Signed)
TTS at bedside. 

## 2018-08-12 NOTE — ED Notes (Signed)
This nurse assuming care of pt . Pt currently laying down, eyes closed, appears to be sleeping. No s/s of distress noted, breathing non labored, respirations equal. Will continue to monitor.

## 2018-08-12 NOTE — ED Notes (Signed)
Patient alert and oriented upon arrival to the unit.  Patient is tired and is sleeping at this time.  Will assess at later time.  Patient treated for scabies before coming back to unit.

## 2018-08-12 NOTE — BH Assessment (Addendum)
Assessment Note     Patient was recently on the unit at [facility]:  Her discharge summary by Dr. Malvin Johns as follows.  Physician Discharge Summary Note  Patient: Cheryl Holt is an 28 y.o., female  MRN: [id number]  DOB: [birth year]  Patient phone: [phone] (home)  Patient address:  [place] [address],  Total Time spent with patient: 45 minutes  Date of Admission: 07/09/2018  Date of Discharge: 07/15/18  Reason for Admission:  Ms. Litzau is 28 years of age required admission basically for depressive symptoms, threats of suicide and abuse of cocaine and history of heroin as well she has presented with drug screens consistently positive for cannabis and intermittently positive for cocaine and opiates. See the admission note  Principal Problem: Cocaine-induced mood disorder [facility])  Discharge Diagnoses: Principal Problem:  Cocaine-induced mood disorder (HCC)  Active Problems:  MDD (major depressive disorder), recurrent severe, without psychosis (HCC)   Past Medical History:      Past Medical History:  Diagnosis Date  . Depression   . Gestational diabetes   . Headache   . Hepatitis C 05/11/2016   [ ]  f/u quant [ ]  cmp  . Infection    UTI  . IV drug abuse (HCC)   . Polysubstance abuse [facility])         Past Surgical History:  Procedure Laterality Date  . CESAREAN SECTION    . CHOLECYSTECTOMY, LAPAROSCOPIC  2014   Family History:       Family History  Problem Relation Age of Onset  . Cancer Mother    pancreatic  . Migraines Mother   . Mental illness Father   . Epilepsy Father   . Migraines Father   . Hypertension Father    Social History:  Social History      Substance and Sexual Activity  Alcohol Use No   Social History       Substance and Sexual Activity  Drug Use Yes  . Types: Marijuana, Cocaine   Comment: not since pregnancy   Social History        Socioeconomic History  . Marital status: Divorced    Spouse name: Not on file  . Number of  children: Not on file  . Years of education: Not on file  . Highest education level: Not on file  Occupational History  . Not on file  Social Needs  . Financial resource strain: Not on file  . Food insecurity:    Worry: Not on file    Inability: Not on file  . Transportation needs:    Medical: Not on file    Non-medical: Not on file  Tobacco Use  . Smoking status: Current Every Day Smoker    Packs/day: 0.50    Years: 10.00    Pack years: 5.00    Types: Cigarettes  . Smokeless tobacco: Never Used  Substance and Sexual Activity  . Alcohol use: No  . Drug use: Yes    Types: Marijuana, Cocaine    Comment: not since pregnancy  . Sexual activity: Not on file  Lifestyle  . Physical activity:    Days per week: Not on file    Minutes per session: Not on file  . Stress: Not on file  Relationships  . Social connections:    Talks on phone: Not on file    Gets together: Not on file    Attends religious service: Not on file    Active member of club or organization: Not on  file    Attends meetings of clubs or organizations: Not on file    Relationship status: Not on file  Other Topics Concern  . Not on file  Social History Narrative  . Not on file   Hospital Course:  Once here she remained generally cooperative pleasant displayed no dangerous behaviors. Suicidal thoughts resolved. She displayed no cravings tremors or withdrawal symptoms by the date of the 24th and was requesting discharge home. Again no thoughts of harming self or others and did seem to have recalibrated to her baseline as far as mood, and of course is told abstain from all illicit compounds. Medication adjustments included the Keflex for vaginitis and the continuation of fluoxetine, prenatal vitamins, and she declined medications for cravings  Physical Findings:  AIMS: Facial and Oral Movements  Muscles of Facial Expression: None, normal  Lips and Perioral Area: None, normal  Jaw: None, normal  Tongue: None,  normal,Extremity Movements  Upper (arms, wrists, hands, fingers): None, normal  Lower (legs, knees, ankles, toes): None, normal, Trunk Movements  Neck, shoulders, hips: None, normal, Overall Severity  Severity of abnormal movements (highest score from questions above): None, normal  Incapacitation due to abnormal movements: None, normal  Patient's awareness of abnormal movements (rate only patient's report): No Awareness, Dental Status  Current problems with teeth and/or dentures?: No  Does patient usually wear dentures?: No  CIWA: CIWA-Ar Total: 1  COWS: COWS Total Score: 2  Musculoskeletal:  Strength & Muscle Tone: within normal limits  Gait & Station: normal  Patient leans: N/A  Psychiatric Specialty Exam:  Physical Exam  ROS  Blood pressure (!) 96/51, pulse (!) 104, temperature 98 F (36.7 C), temperature source Oral, resp. rate 16, height 5\' 5"  (1.651 m), weight 97.1 kg, SpO2 100 %, unknown if currently breastfeeding.Body mass index is 35.61 kg/m.  General Appearance: Casual  Eye Contact: Good  Speech: Clear and Coherent  Volume: Normal  Mood: Euthymic  Affect: Appropriate  Thought Process: Coherent  Orientation: Full (Time, Place, and Person)  Thought Content: Logical  Suicidal Thoughts: No  Homicidal Thoughts: No  Memory: NA  Judgement: Good  Insight: Good  Psychomotor Activity: Normal  Concentration: Attention Span: Good  Recall: Good  Fund of Knowledge: Good  Language: Good  Akathisia: Negative  Handed: Right  AIMS (if indicated):   Assets: Communication Skills  ADL's: Intact  Cognition: WNL  Sleep: Number of Hours: 6.75  Have you used any form of tobacco in the last 30 days? (Cigarettes, Smokeless Tobacco, Cigars, and/or Pipes): Yes  Has this patient used any form of tobacco in the last 30 days? (Cigarettes, Smokeless Tobacco, Cigars, and/or Pipes) Yes, No  Blood Alcohol level:  Recent Labs                             Metabolic Disorder Labs:   Recent Labs                             Recent Labs     Recent Labs     See Psychiatric Specialty Exam and Suicide Risk Assessment completed by Attending Physician prior to discharge.  Discharge destination: Home  Is patient on multiple antipsychotic therapies at discharge: No  Has Patient had three or more failed trials of antipsychotic monotherapy by history: No  Recommended Plan for Multiple Antipsychotic Therapies:  NA        Allergies  as of 07/15/2018      Reactions   Nsaids Itching               Medication List        STOP taking these medications       metroNIDAZOLE 500 MG tablet  Commonly known as: FLAGYL             TAKE these medications     Indication  cephALEXin 500 MG capsule  Commonly known as: KEFLEX  Take 1 capsule (500 mg total) by mouth every 8 (eight) hours for 4 days.  Indication: Middle Ear Inflammation   FLUoxetine 40 MG capsule  Commonly known as: PROZAC  Take 1 capsule (40 mg total) by mouth daily.  Indication: Depression   gabapentin 100 MG capsule  Commonly known as: NEURONTIN  Take 1 capsule (100 mg total) by mouth 3 (three) times daily.  Indication: Neuropathic Pain   hydrOXYzine 25 MG tablet  Commonly known as: ATARAX/VISTARIL  Take 1 tablet (25 mg total) by mouth 3 (three) times daily as needed for itching, anxiety or nausea.  What changed:  when to take this  reasons to take this Indication: Feeling Anxious   NIFEdipine 30 MG 24 hr tablet  Commonly known as: PROCARDIA XL  Take 1 tablet (30 mg total) by mouth 2 (two) times daily as needed (contractions).  Indication: High Blood Pressure Disorder   permethrin 5 % cream  Commonly known as: ELIMITE  Apply to affected area once  Indication: Rosacea   prenatal multivitamin Tabs tablet  Take 1 tablet by mouth daily at 12 noon.  Indication: Vitamin Deficiency          Follow-up Information     [facility] Follow up.    Specialty: Behavioral Health  Why: Your hospital  follow up appointment is Monday, 07/21/18 at 9::00a.  Please bring: photo ID, proof of insurance, social security card, and any discharge paperwork from this hospitalization.  Contact information:  [address]  [place] [address]  [phone]        [facility] Follow up.    Why: Screening for possible admission on Friday, 07/25/17 at 7:45AM. Please bring: photo ID/proof of [place] residency/hospital facesheet is acceptable, 30 day supply of all medications, and clothing. PLEASE CALL TO CANCEL IF YOU DO NOT PLAN TO ATTEND.  Contact information:  [address]  [place] [address]  [phone]        [facility] Follow up.    Specialty: Addiction Medicine  Why: Referral faxed: 12/20 and phone interview scheduled for 12/23 at 11:15AM. Patient plans to follow-up with [facility] on her own at discharge.  Contact information:  [address]  [place] [address]  [phone]          Follow-up recommendations: Activity: full  Comments:  Stable for discharge from diagnosis depression recurrent severe without psychosis/polysubstance abuse  Signed:  Malvin Johns, MD  TTS Assessment:  Patient presents to [facility] seeking help for her drug problem vis EMS and stating that she is currently suicidal.  Patient was recently discharged from [facility] on 07/15/2018 after having been admitted for a similar presentation. Patient states that when she was discharged from [facility] that she was supposed to follow-up with either [facility] or [facility] in order to get into a treatment program.  Patient states that instead of doing this that she hooked with a friend who was using and states that she relapsed  immediately upon discharge and states that she has been using ever since.  Patient states that she has been having suicidal thoughts, but no plan, but does admit to an overdose on Seroquel in 2016 as a suicide attempt and patient states that she was hospitalized  [facility].  Patient states that she is not homicidal and states that she is not psychotic.  Patient states that she is depressed because of her addiction and the lifestyle that she is living and realizes that she needs to do something to change her life.  Patient is requesting to be admitted to the hospital and states that on this occasion, "I am going to listen to what people tell me to do." Patient denies having any access to weapons.  Patient states that she is essentially homeless and has been living in a motel.  Patient states that she has been using $20 worth of heroin daily and states that she has been using cocaine, $60 worth on two occasions [facility].  Patient states that on overage that she only uses cocaine a couple times monthly.  Patient denies any other drug or alcohol use and states that she has not used anything in the past couple of days.  She does not appear to be in any acute withdrawal at this time.  Patient states that she has four children, but does not have custody of them and states that they are living with their grandmother.  Patient is not currently working and states that she has minimal support.  Patient denies any current legal issues and states that she is not on probation.  Patient denies any history of abuse or self-mutilation.   Patient presented as alert and oriented, her thoughts were organized and her memory intact.  Patient presented as being depressed.  Patient speech and eye contact were good.  Patient's insight, judgment and impulse control were poor.  Patient did not appear to be responding to any internal stimuli.  Patient's affect was pleasant and her motor activity was normal.   Diagnosis: F33.2 MDD Recurrent Severe without psychosis, F11.20 Opioid use disorder severe, F14.20 Cocaine Use Disorder Severe.    Past Medical History:  Past Medical History:  Diagnosis Date  . Depression   . Gestational diabetes   . Headache   .  Hepatitis C 05/11/2016   [ ]  f/u quant [ ]  cmp  . Infection    UTI  . IV drug abuse (HCC)   . Polysubstance abuse [facility](HCC)     Past Surgical History:  Procedure Laterality Date  . CESAREAN SECTION    . CHOLECYSTECTOMY, LAPAROSCOPIC  2014    Family History:  Family History  Problem Relation Age of Onset  . Cancer Mother        pancreatic  . Migraines Mother   . Mental illness Father   . Epilepsy Father   . Migraines Father   . Hypertension Father     Social History:  reports that she has been smoking cigarettes. She has a 5.00 pack-year smoking history. She has never used smokeless tobacco. She reports current drug use. Drugs: Marijuana, Cocaine, and Methamphetamines. She reports that she does not drink alcohol.  Additional Social History:  Alcohol / Drug Use Pain Medications: denies Prescriptions: denies Over the Counter: denies History of alcohol / drug use?: Yes Longest period of sobriety (when/how long): non significant period of abstinence since onset Negative Consequences of Use: Financial, Personal relationships, Work / Programmer, multimediachool, Interior and spatial designerLegal Substance #  1 Name of Substance 1: heroin 1 - Age of First Use: 24 1 - Amount (size/oz): $20 1 - Frequency: daily 1 - Duration: since onset 1 - Last Use / Amount: 2 days ago Substance #2 Name of Substance 2: cocaine 2 - Age of First Use: 2011 2 - Amount (size/oz): $60 2 - Frequency: once every two weeks 2 - Duration: since onset 2 - Last Use / Amount: 2 days ago  CIWA: CIWA-Ar BP: 118/67 Pulse Rate: 98 COWS:    Allergies:  Allergies  Allergen Reactions  . Nsaids Itching    Home Medications: (Not in a hospital admission)   OB/GYN Status:  Patient's last menstrual period was 07/18/2018.  General Assessment Data [facility] of Assessment: [facility] TTS Assessment: In system Is this a Tele or Face-to-Face Assessment?: Face-to-Face Is this an Initial Assessment or a Re-assessment for this encounter?: Initial Assessment Patient  Accompanied by:: N/A Language Other than English: No Living Arrangements: Other (Comment) What gender do you identify as?: (essentially homeless and staying in a hotel) Marital status: Single Maiden name: Loss adjuster, chartered) Pregnancy Status: No Living Arrangements: Alone Can pt return to current living arrangement?: Yes Admission Status: Voluntary Is patient capable of signing voluntary admission?: Yes Referral Source: Self/Family/Friend Insurance type: (self-pay)     Crisis Care Plan Living Arrangements: Alone Legal Guardian: Other:(self) Name of Psychiatrist: none Name of Therapist: none  Education Status Is patient currently in school?: No Is the patient employed, unemployed or receiving disability?: Unemployed  Risk to self with the past 6 months Suicidal Ideation: Yes-Currently Present Has patient been a risk to self within the past 6 months prior to admission? : Yes Suicidal Intent: No-Not Currently/Within Last 6 Months Has patient had any suicidal intent within the past 6 months prior to admission? : Yes Is patient at risk for suicide?: Yes Suicidal Plan?: No Has patient had any suicidal plan within the past 6 months prior to admission? : No Access to Means: No What has been your use of drugs/alcohol within the last 12 months?: (abusing heroin and cocaine) Previous Attempts/Gestures: Yes How many times?: 1 Other Self Harm Risks: (homeless, minimal support, addiction issues) Triggers for Past Attempts: None known Intentional Self Injurious Behavior: None Family Suicide History: No Recent stressful life event(s): Job Loss, Financial Problems Persecutory voices/beliefs?: No Depression: Yes Depression Symptoms: Isolating, Guilt, Loss of interest in usual pleasures, Feeling worthless/self pity Substance abuse history and/or treatment for substance abuse?: Yes Suicide prevention information given to non-admitted patients: Not applicable  Risk to Others within the past 6  months Homicidal Ideation: No Does patient have any lifetime risk of violence toward others beyond the six months prior to admission? : No Thoughts of Harm to Others: No Current Homicidal Intent: No-Not Currently/Within Last 6 Months Current Homicidal Plan: No Access to Homicidal Means: No Identified Victim: none History of harm to others?: No Assessment of Violence: None Noted Violent Behavior Description: none Does patient have access to weapons?: No Criminal Charges Pending?: No Does patient have a court date: No Is patient on probation?: No  Psychosis Hallucinations: None noted Delusions: None noted  Mental Status Report Appearance/Hygiene: Disheveled, Poor hygiene Eye Contact: Good Motor Activity: Freedom of movement Speech: Logical/coherent Level of Consciousness: Alert Mood: Depressed Affect: Appropriate to circumstance Anxiety Level: Moderate Thought Processes: Coherent, Relevant Judgement: Impaired Orientation: Person, Place, Time, Situation Obsessive Compulsive Thoughts/Behaviors: Severe(concerning her compulsion to use drugs)  Cognitive Functioning Concentration: Decreased Memory: Recent Intact Is patient IDD: No Insight: Poor Impulse  Control: Poor Appetite: Good Have you had any weight changes? : No Change Sleep: Decreased Total Hours of Sleep: (4) Vegetative Symptoms: Decreased grooming  ADLScreening [facility] Assessment Services) Patient's cognitive ability adequate to safely complete daily activities?: Yes Patient able to express need for assistance with ADLs?: Yes Independently performs ADLs?: Yes (appropriate for developmental age)  Prior Inpatient Therapy Prior Inpatient Therapy: Yes Prior Therapy Dates: 2016 and 2019 [facility] Reason for Treatment: depression and SA use  Prior Outpatient Therapy Prior Outpatient Therapy: No  ADL Screening (condition at time of admission) Patient's cognitive ability  adequate to safely complete daily activities?: Yes Is the patient deaf or have difficulty hearing?: No Does the patient have difficulty seeing, even when wearing glasses/contacts?: No Does the patient have difficulty concentrating, remembering, or making decisions?: No Patient able to express need for assistance with ADLs?: Yes Does the patient have difficulty dressing or bathing?: No Independently performs ADLs?: Yes (appropriate for developmental age) Does the patient have difficulty walking or climbing stairs?: No Weakness of Legs: None Weakness of Arms/Hands: None  Home Assistive Devices/Equipment Home Assistive Devices/Equipment: None  Therapy Consults (therapy consults require a physician order) PT Evaluation Needed: No OT Evalulation Needed: No SLP Evaluation Needed: No Abuse/Neglect Assessment (Assessment to be complete while patient is alone) Abuse/Neglect Assessment Can Be Completed: Yes Physical Abuse: Denies Verbal Abuse: Denies Sexual Abuse: Denies Exploitation of patient/patient's resources: Denies Self-Neglect: Denies Values / Beliefs Cultural Requests During Hospitalization: None Spiritual Requests During Hospitalization: None Consults Spiritual Care Consult Needed: No Social Work Consult Needed: No Merchant navy officerAdvance Directives (For Healthcare) Does Patient Have a Medical Advance Directive?: No Would patient like information on creating a medical advance directive?: No - Patient declined Nutrition Screen- [facility] Adult/[facility] Has the patient recently lost weight without trying?: No Has the patient been eating poorly because of a decreased appetite?: No Malnutrition Screening Tool Score: 0        Disposition: Per Malachy Chamberakia Starkes, NP, patient will need to be monitored for safety and withdrawal potential as she is being medically cleared for scabies and patient will be re-evaluated for final disposition tomorrow and to be seen by peer support.   Disposition Initial  Assessment Completed for this Encounter: Yes Disposition of Patient: (overnight OBS- Monitor for safety and withdrawal potential)  On Site Evaluation by:   Reviewed with Physician:    Arnoldo Lenisanny J Waller Marcussen 08/12/2018 4:33 PM

## 2018-08-12 NOTE — ED Notes (Signed)
Bed: WA30 Expected date:  Expected time:  Means of arrival:  Comments: EMS SI 

## 2018-08-12 NOTE — BH Assessment (Signed)
[facility] Assessment Progress Note     Per Malachy Chamber, NP, patient will need to be monitored for safety and withdrawal potential as she is being medically cleared for scabies and patient will be re-evaluated for final disposition tomorrow and to be seen by peer support.

## 2018-08-12 NOTE — ED Provider Notes (Signed)
[facility]-EMERGENCY DEPT Provider Note   CSN: [id number] Arrival date & time: 08/12/18  1310     History   Chief Complaint Chief Complaint  Patient presents with  . Suicidal    HPI Cheryl Holt is a 28 y.o. female.  28 year old female with prior medical history as detailed below presents for evaluation of reported suicidal ideation.  Patient reports increasing symptoms of depression.  She reports thoughts of hurting herself.  She denies specific plan.  She does report prior history of suicide attempt.  This was several years ago.  She did attempt overdose at that time.  She does report use of heroin.  Her last use of heroin was 2 days prior.  She denies other drug use.  The history is provided by the patient and medical records.  Mental Health Problem  Presenting symptoms: suicidal thoughts   Degree of incapacity (severity):  Moderate Onset quality:  Gradual Duration:  4 days Timing:  Constant Progression:  Worsening Chronicity:  New Treatment compliance:  Untreated Relieved by:  Nothing Worsened by:  Nothing   Past Medical History:  Diagnosis Date  . Depression   . Gestational diabetes   . Headache   . Hepatitis C 05/11/2016   [ ]  f/u quant [ ]  cmp  . Infection    UTI  . IV drug abuse (HCC)   . Polysubstance abuse [facility](HCC)     Patient Active Problem List   Diagnosis Date Noted  . MDD (major depressive disorder), recurrent severe, without psychosis (HCC) 07/09/2018  . 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 Pregnancy complicated by subutex maintenance, antepartum (HCC) 09/18/2017  . Dichorionic diamniotic twin pregnancy in third trimester 09/18/2017  . Chronic viral hepatitis affecting pregnancy in third trimester (HCC) 09/18/2017  . Preterm labor 09/16/2017  . Left breast abscess 07/25/2017  . Supervision of high-risk pregnancy 05/24/2016  . Hepatitis C 05/11/2016  . Obesity in pregnancy 05/10/2016  . Rh negative status  during pregnancy 04/04/2016  . History of gestational diabetes mellitus 03/27/2016  . Substance abuse complicating pregnancy in third trimester, antepartum 02/26/2016  . Anxiety and depression 02/26/2016  . Cannabis use disorder, moderate, dependence (HCC) 01/18/2016  . 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 Tobacco use disorder 01/18/2016  . Opioid use disorder, severe, in sustained remission (HCC) 01/18/2016  . 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 CESAREAN SECTION    . CHOLECYSTECTOMY, LAPAROSCOPIC  2014     OB History    Gravida  5   Para  2   Term  2   Preterm  0   AB  2   Living  2     SAB  0   TAB  2   Ectopic      Multiple      Live Births  1            Home Medications    Prior to Admission medications   Medication Sig Start Date End Date Taking? Authorizing Provider  FLUoxetine (PROZAC) 40 MG capsule Take 1 capsule (40 mg total) by mouth daily. 07/15/18  Yes Malvin JohnsFarah, Brian, MD  Prenatal Vit-Fe Fumarate-FA (PRENATAL MULTIVITAMIN) TABS tablet Take 1 tablet by mouth daily at 12 noon. 05/20/17  Yes Devoria AlbeKnapp, Iva, MD  diphenhydrAMINE (BENADRYL) 25 MG tablet Take 1 tablet (25 mg total) by mouth every 6 (six) hours. Patient  not taking: Reported on 08/12/2018 07/18/18   Azalia Bilis, MD  gabapentin (NEURONTIN) 100 MG capsule Take 1 capsule (100 mg total) by mouth 3 (three) times daily. Patient not taking: Reported on 08/12/2018 07/15/18   Malvin Johns, MD  hydrOXYzine (ATARAX/VISTARIL) 25 MG tablet Take 1 tablet (25 mg total) by mouth 3 (three) times daily as needed for itching, anxiety or nausea. Patient not taking: Reported on 08/12/2018 07/15/18   Malvin Johns, MD  NIFEdipine (PROCARDIA XL) 30 MG 24 hr tablet Take 1 tablet (30 mg total) by mouth 2 (two) times daily as needed (contractions). Patient not taking:  Reported on 08/12/2018 09/25/17   Judeth Horn, NP  permethrin Verner Mould) 5 % cream Apply to affected area once Patient not taking: Reported on 08/12/2018 07/18/18   Azalia Bilis, MD    Family History Family History  Problem Relation Age of Onset  . Cancer Mother        pancreatic  . Migraines Mother   . Mental illness Father   . Epilepsy Father   . Migraines Father   . Hypertension Father     Social History Social History   Tobacco Use  . Smoking status: Current Every Day Smoker    Packs/day: 0.50    Years: 10.00    Pack years: 5.00    Types: Cigarettes  . Smokeless tobacco: Never Used  Substance Use Topics  . Alcohol use: No  . Drug use: Yes    Types: Marijuana, Cocaine, Methamphetamines    Comment: using $20 heroin daily /$60 cocaine q 2 weeks     Allergies   Nsaids   Review of Systems Review of Systems  Psychiatric/Behavioral: Positive for suicidal ideas.  All other systems reviewed and are negative.    Physical Exam Updated Vital Signs BP 118/67   Pulse 98   Temp 98.9 F (37.2 C)   Resp 18   LMP 07/18/2018   SpO2 99%   Physical Exam Vitals signs and nursing note reviewed.  Constitutional:      General: She is not in acute distress.    Appearance: She is well-developed.  HENT:     Head: Normocephalic and atraumatic.  Eyes:     Conjunctiva/sclera: Conjunctivae normal.     Pupils: Pupils are equal, round, and reactive to light.  Neck:     Musculoskeletal: Normal range of motion and neck supple.  Cardiovascular:     Rate and Rhythm: Normal rate and regular rhythm.     Heart sounds: Normal heart sounds.  Pulmonary:     Effort: Pulmonary effort is normal. No respiratory distress.     Breath sounds: Normal breath sounds.  Abdominal:     General: There is no distension.     Palpations: Abdomen is soft.     Tenderness: There is no abdominal tenderness.  Musculoskeletal: Normal range of motion.        General: No deformity.  Skin:    General:  Skin is warm and dry.  Neurological:     General: No focal deficit present.     Mental Status: She is alert and oriented to person, place, and time.  Psychiatric:     Comments: Expresses suicidal ideation without specific plan.      ED Treatments / Results  Labs (all labs ordered are listed, but only abnormal results are displayed) Labs Reviewed  RAPID URINE DRUG SCREEN, HOSP PERFORMED - Abnormal; Notable for the following components:      Result Value   Opiates  POSITIVE (*)    Cocaine POSITIVE (*)    All other components within normal limits  ACETAMINOPHEN LEVEL - Abnormal; Notable for the following components:   Acetaminophen (Tylenol), Serum <10 (*)    All other components within normal limits  BASIC METABOLIC PANEL  ETHANOL  SALICYLATE LEVEL  CBC WITH DIFFERENTIAL/PLATELET  I-STAT BETA HCG BLOOD, ED ([facility], [facility], [facility] ONLY)    EKG None  Radiology No results found.  Procedures Procedures (including critical care time)  Medications Ordered in ED Medications  permethrin (ELIMITE) 5 % cream ( Topical Given 08/12/18 1625)     Initial Impression / Assessment and Plan / ED Course  I have reviewed the triage vital signs and the nursing notes.  Pertinent labs & imaging results that were available during my care of the patient were reviewed by me and considered in my medical decision making (see chart for details).     MDM  Screen complete  Patient is presenting for evaluation of reported suicidal ideation.  Patient is also complaining of possible scabies infection.  This will be treated in the ED.  There is no evidence of acute medical pathology.  She is medically clear at this time per further psychiatric evaluation.  Patient will be held on a voluntary basis in the ED pending placement for psychiatric treatment. Final Clinical Impressions(s) / ED Diagnoses   Final diagnoses:  Suicidal ideation  Scabies    ED Discharge Orders    None       Wynetta Fines, MD 08/12/18 [phone]

## 2018-08-12 NOTE — ED Notes (Signed)
Pt to be moved to room 21.

## 2018-08-12 NOTE — ED Notes (Signed)
Phlebotomist called for ordered lab draw.  

## 2018-08-12 NOTE — ED Triage Notes (Signed)
Patient here from hotel via EMS with complaints of suicidal ideation without plan. Reports detoxing from heroin x2 days.

## 2018-08-13 ENCOUNTER — Ambulatory Visit (HOSPITAL_COMMUNITY): Payer: Self-pay

## 2018-08-13 DIAGNOSIS — F1120 Opioid dependence, uncomplicated: Secondary | ICD-10-CM | POA: Diagnosis present

## 2018-08-13 MED ORDER — FLUOXETINE HCL 20 MG PO CAPS
40.0000 mg | ORAL_CAPSULE | Freq: Every day | ORAL | Status: DC
  Administered 2018-08-13: 40 mg via ORAL
  Filled 2018-08-13: qty 2

## 2018-08-13 MED ORDER — NICOTINE 21 MG/24HR TD PT24
21.0000 mg | MEDICATED_PATCH | Freq: Once | TRANSDERMAL | Status: DC
  Administered 2018-08-13: 21 mg via TRANSDERMAL
  Filled 2018-08-13: qty 1

## 2018-08-13 MED ORDER — GABAPENTIN 100 MG PO CAPS
100.0000 mg | ORAL_CAPSULE | Freq: Three times a day (TID) | ORAL | Status: DC
  Administered 2018-08-13 (×2): 100 mg via ORAL
  Filled 2018-08-13 (×2): qty 1

## 2018-08-13 MED ORDER — HYDROXYZINE HCL 10 MG PO TABS
10.0000 mg | ORAL_TABLET | Freq: Two times a day (BID) | ORAL | Status: DC
  Administered 2018-08-13: 10 mg via ORAL
  Filled 2018-08-13: qty 1

## 2018-08-13 NOTE — BH Assessment (Signed)
[facility] Assessment Progress Note  Per Juanetta Beets, DO, this pt does not require psychiatric hospitalization at this time.  Pt is to be discharged from [facility] with recommendation to follow up with [facility].  This has been included in pt's discharge instructions.  Pt would also benefit from seeing Peer Support Specialists; they will be asked to speak to pt.  Pt's nurse, Gabriel Earing, has been notified.  Doylene Canning, MA Triage Specialist [phone]

## 2018-08-13 NOTE — Patient Outreach (Signed)
CPSS met with the patient in the [facility] to provide several substance use recovery resources including residential/outpatient substance use treatment center list, NA meeting list, [facility] information, and CPSS contact information. Patient was interested in detox referral for her heroin use. [facility] was not able to accept the patient today due to recent history of scabies and [facility] did not have any beds available today as well for detox. CPSS also provided information for homeless shelter resources including the [facility] emergency shelter information, homeless shelter list, [place] bus passes, and bus directions to the [facility] emergency shelter. CPSS strongly encouraged the patient to follow up with CPSS for further help with getting connected to substance use treatment resources if needed after discharge from the [facility].

## 2018-08-13 NOTE — Patient Outreach (Signed)
ED Peer Support Specialist Patient Intake (Complete at intake & 30-60 Day Follow-up)  Name: Cheryl Holt  MRN: [id number]  Age: 28 y.o.   Date of Admission: 08/13/2018  Intake: Initial Comments:      Primary Reason Admitted: poly substance use with heroin and cocaine use, scabies  Lab values: Alcohol/ETOH: Negative Positive UDS? Yes Amphetamines: No Barbiturates: No Benzodiazepines: No Cocaine: Yes Opiates: Yes Cannabinoids: No  Demographic information: Gender: Female Ethnicity: White Marital Status: Single Insurance Status: Best boy (Work Neurosurgeon, Physicist, medical, etc.: No Lives with: Alone Living situation: Homeless  Reported Patient History: Patient reported health conditions: Depression, Other (comment)(anxiety) Patient aware of HIV and hepatitis status: Yes (comment)(Negative)  In past year, has patient visited ED for any reason? Yes  Number of ED visits: 7  Reason(s) for visit: scabies x4, rash,hip pain, SI/cocaine use  In past year, has patient been hospitalized for any reason? Yes  Number of hospitalizations: 1  Reason(s) for hospitalization: SI, cocaine use at [facility] December 2019  In past year, has patient been arrested? No  Number of arrests:    Reason(s) for arrest:    In past year, has patient been incarcerated? No  Number of incarcerations:    Reason(s) for incarceration:    In past year, has patient received medication-assisted treatment? No  In past year, patient received the following treatments: Psychiatric residential treatment facility([facility] in December 2019)  In past year, has patient received any harm reduction services? No  Did this include any of the following?    In past year, has patient received care from a mental health provider for diagnosis other than SUD? No  In past year, is this first time patient has overdosed? (has not overdosed)  Number of past overdoses:    In past  year, is this first time patient has been hospitalized for an overdose? (has not overdosed)  Number of hospitalizations for overdose(s):    Is patient currently receiving treatment for a mental health diagnosis? No  Patient reports experiencing difficulty participating in SUD treatment: No    Most important reason(s) for this difficulty?    Has patient received prior services for treatment? No  In past, patient has received services from following agencies:    Plan of Care:  Suggested follow up at these agencies/treatment centers: (Patient wants help with detox referral for her heroin use. CPSS will help with those referrals. )  Other information: CPSS met with the patient to provide substance use recovery support and help with substance use treatment resources. CPSS will provide information for [facility] along with information for other substance use treatment centers and CPSS contact information.    Mason Jim, CPSS  08/13/2018 1:16 PM

## 2018-08-13 NOTE — Consult Note (Addendum)
[facility] Psych ED Discharge  08/13/2018 11:50 AM Cheryl Holt  MRN:  [id number] Principal Problem: Uncomplicated opioid dependence [facility](HCC) Discharge Diagnoses: Principal Problem:   Opioid use disorder, severe, in sustained remission (HCC) Active Problems:   MDD (major depressive disorder), recurrent severe, without psychosis (HCC)   Cocaine-induced mood disorder (HCC)   Subjective: Pt was seen and chart reviewed with treatment team and Dr Sharma CovertNorman.  Pt denies suicidal/homicidal ideation, denies auditory/visual hallucinations and does not appear to be responding to internal stimuli. Pt is well known to the emergency room system and is homeless. She frequently presents with multiple drugs in her system and will have suicidal ideation. Pt's UDS positive for opiates and cocaine, BAL negative. Pt also has scabies, which have been treated while in the [facility]. Pt has been in local emergency rooms 4 times in 2019 for scabies.  Pt [facility] to 07-15-2018 and [facility], which she failed to do. Pt has court charges upcoming on 04-16-2019 for prostitution. Pt will be given outpatient resources for medication management and also meet with Peer Support for assistance with substance abuse treatment facilities in the community. Pt does not appear to be invested in correcting her drug use and has a high degree of secondary gain by seeking shelter in the emergency room. Pt is psychiatrically clear.   Total Time spent with patient: 30 minutes  Past Psychiatric History: As above  Past Medical History:  Past Medical History:  Diagnosis Date  . Depression   . Gestational diabetes   . Headache   . Hepatitis C 05/11/2016   [ ]  f/u quant [ ]  cmp  . Infection    UTI  . IV drug abuse (HCC)   . Polysubstance abuse [facility](HCC)     Past Surgical History:  Procedure Laterality Date  . CESAREAN SECTION    . CHOLECYSTECTOMY, LAPAROSCOPIC  2014   Family History:  Family History   Problem Relation Age of Onset  . Cancer Mother        pancreatic  . Migraines Mother   . Mental illness Father   . Epilepsy Father   . Migraines Father   . Hypertension Father    Family Psychiatric  History: As stated above.  Social History:  Social History   Substance and Sexual Activity  Alcohol Use No     Social History   Substance and Sexual Activity  Drug Use Yes  . Types: Marijuana, Cocaine, Methamphetamines   Comment: using $20 heroin daily /$60 cocaine q 2 weeks    Social History   Socioeconomic History  . Marital status: Divorced    Spouse name: Not on file  . Number of children: Not on file  . Years of education: Not on file  . Highest education level: Not on file  Occupational History  . Not on file  Social Needs  . Financial resource strain: Not on file  . Food insecurity:    Worry: Not on file    Inability: Not on file  . Transportation needs:    Medical: Not on file    Non-medical: Not on file  Tobacco Use  . Smoking status: Current Every Day Smoker    Packs/day: 0.50    Years: 10.00    Pack years: 5.00    Types: Cigarettes  . Smokeless tobacco: Never Used  Substance and Sexual Activity  . Alcohol use: No  . Drug use: Yes    Types: Marijuana,  Cocaine, Methamphetamines    Comment: using $20 heroin daily /$60 cocaine q 2 weeks  . Sexual activity: Not on file  Lifestyle  . Physical activity:    Days per week: Not on file    Minutes per session: Not on file  . Stress: Not on file  Relationships  . Social connections:    Talks on phone: Not on file    Gets together: Not on file    Attends religious service: Not on file    Active member of club or organization: Not on file    Attends meetings of clubs or organizations: Not on file    Relationship status: Not on file  Other Topics Concern  . Not on file  Social History Narrative  . Not on file    Has this patient used any form of tobacco in the last 30 days? (Cigarettes, Smokeless  Tobacco, Cigars, and/or Pipes) Prescription not provided because: Pt declined  Current Medications: Current Facility-Administered Medications  Medication Dose Route Frequency Provider Last Rate Last Dose  . FLUoxetine (PROZAC) capsule 40 mg  40 mg Oral Daily Laveda AbbeParks, Laurie Britton, NP   40 mg at 08/13/18 [id number]  . gabapentin (NEURONTIN) capsule 100 mg  100 mg Oral TID Laveda AbbeParks, Laurie Britton, NP   100 mg at 08/13/18 0930  . hydrOXYzine (ATARAX/VISTARIL) tablet 10 mg  10 mg Oral BID Laveda AbbeParks, Laurie Britton, NP   10 mg at 08/13/18 [id number]  . nicotine (NICODERM CQ - dosed in mg/24 hours) patch 21 mg  21 mg Transdermal Once Wynetta FinesMessick, Peter C, MD   21 mg at 08/12/18 [id number]   Current Outpatient Medications  Medication Sig Dispense Refill  . FLUoxetine (PROZAC) 40 MG capsule Take 1 capsule (40 mg total) by mouth daily. 90 capsule 1  . Prenatal Vit-Fe Fumarate-FA (PRENATAL MULTIVITAMIN) TABS tablet Take 1 tablet by mouth daily at 12 noon. 30 tablet 0  . diphenhydrAMINE (BENADRYL) 25 MG tablet Take 1 tablet (25 mg total) by mouth every 6 (six) hours. (Patient not taking: Reported on 08/12/2018) 12 tablet 0  . gabapentin (NEURONTIN) 100 MG capsule Take 1 capsule (100 mg total) by mouth 3 (three) times daily. (Patient not taking: Reported on 08/12/2018) 90 capsule 2  . hydrOXYzine (ATARAX/VISTARIL) 25 MG tablet Take 1 tablet (25 mg total) by mouth 3 (three) times daily as needed for itching, anxiety or nausea. (Patient not taking: Reported on 08/12/2018) 30 tablet 0  . NIFEdipine (PROCARDIA XL) 30 MG 24 hr tablet Take 1 tablet (30 mg total) by mouth 2 (two) times daily as needed (contractions). (Patient not taking: Reported on 08/12/2018) 30 tablet 0  . permethrin (ELIMITE) 5 % cream Apply to affected area once (Patient not taking: Reported on 08/12/2018) 60 g 2     Musculoskeletal: Strength & Muscle Tone: within normal limits Gait & Station: normal Patient leans: N/A  Psychiatric Specialty Exam: Physical Exam   Nursing note and vitals reviewed. Constitutional: She is oriented to person, place, and time. She appears well-developed and well-nourished.  HENT:  Head: Normocephalic and atraumatic.  Neck: Normal range of motion.  Respiratory: Effort normal.  Musculoskeletal: Normal range of motion.  Neurological: She is alert and oriented to person, place, and time.  Psychiatric: Her speech is normal and behavior is normal. Judgment and thought content normal. Cognition and memory are normal. She exhibits a depressed mood.    Review of Systems  Psychiatric/Behavioral: Positive for depression and substance abuse.  All other systems reviewed and are  negative.   Blood pressure 115/69, pulse 83, temperature 98.4 F (36.9 C), temperature source Oral, resp. rate 17, last menstrual period 07/18/2018, SpO2 99 %, unknown if currently breastfeeding.There is no height or weight on file to calculate BMI.  General Appearance: Casual  Eye Contact:  Good  Speech:  Clear and Coherent and Normal Rate  Volume:  Normal  Mood:  Anxious and Depressed  Affect:  Congruent  Thought Process:  Coherent, Linear and Descriptions of Associations: Intact  Orientation:  Full (Time, Place, and Person)  Thought Content:  Logical  Suicidal Thoughts:  No  Homicidal Thoughts:  No  Memory:  Immediate;   Good Recent;   Good Remote;   Fair  Judgement:  Fair  Insight:  Shallow  Psychomotor Activity:  Normal  Concentration:  Concentration: Good and Attention Span: Good  Recall:  Good  Fund of Knowledge:  Good  Language:  Good  Akathisia:  No  Handed:  Right  AIMS (if indicated):   N/A  Assets:  Psychologist, counselling Resources/Insurance  ADL's:  Intact  Cognition:  WNL  Sleep:   N/A     Demographic Factors:  Low socioeconomic status and Unemployed  Loss Factors: Legal issues and Financial problems/change in socioeconomic status  Historical Factors: Family history of mental illness or substance  abuse  Risk Reduction Factors:   Sense of responsibility to family  Continued Clinical Symptoms:  Bipolar Disorder:   Depressive phase Alcohol/Substance Abuse/Dependencies  Cognitive Features That Contribute To Risk:  Closed-mindedness    Suicide Risk:  Minimal: No identifiable suicidal ideation.  Patients presenting with no risk factors but with morbid ruminations; may be classified as minimal risk based on the severity of the depressive symptoms    Plan Of Care/Follow-up recommendations:  Activity:  as tolerated Diet:  heart healthy  Disposition and treatment plan:  Opioid use disorder, severe, in sustained remission (HCC) Take all medications as prescribed. Keep all follow-up appointments as scheduled.  Do not consume alcohol or use illegal drugs while on prescription medications. Report any adverse effects from your medications to your primary care provider promptly.  In the event of recurrent symptoms or worsening symptoms, call 911, a crisis hotline, or go to the nearest emergency department for evaluation.   Laveda Abbe, NP 08/13/2018, 11:50 AM   Patient seen face-to-face for psychiatric evaluation, chart reviewed and case discussed with the physician extender and developed treatment plan. Reviewed the information documented and agree with the treatment plan.  Juanetta Beets, DO 08/13/18 12:36 PM

## 2018-08-13 NOTE — Discharge Instructions (Signed)
To help you maintain a sober lifestyle, a substance abuse treatment program may be beneficial to you.  Contact [facility] at your earliest opportunity to ask about enrolling in their program: ° °     [facility] °     [address] °     [place], [place] [address] °     [phone] °     New patients are seen at the walk-in clinic every Tuesday from 9:00 am - 12:00 pm °

## 2018-08-17 ENCOUNTER — Emergency Department (HOSPITAL_COMMUNITY)
Admission: EM | Admit: 2018-08-17 | Discharge: 2018-08-17 | Disposition: A | Discharge status: Home / Self Care | Payer: Self-pay | Attending: Emergency Medicine | Admitting: Emergency Medicine

## 2018-08-17 ENCOUNTER — Other Ambulatory Visit: Payer: Self-pay

## 2018-08-17 ENCOUNTER — Encounter (HOSPITAL_COMMUNITY): Payer: Self-pay

## 2018-08-17 DIAGNOSIS — L02415 Cutaneous abscess of right lower limb: Secondary | ICD-10-CM | POA: Insufficient documentation

## 2018-08-17 DIAGNOSIS — L0291 Cutaneous abscess, unspecified: Secondary | ICD-10-CM

## 2018-08-17 DIAGNOSIS — F17210 Nicotine dependence, cigarettes, uncomplicated: Secondary | ICD-10-CM | POA: Insufficient documentation

## 2018-08-17 DIAGNOSIS — Z79899 Other long term (current) drug therapy: Secondary | ICD-10-CM | POA: Insufficient documentation

## 2018-08-17 DIAGNOSIS — L02416 Cutaneous abscess of left lower limb: Secondary | ICD-10-CM | POA: Insufficient documentation

## 2018-08-17 LAB — POC URINE PREG, ED: Preg Test, Ur: NEGATIVE

## 2018-08-17 MED ORDER — ACETAMINOPHEN 500 MG PO TABS
1000.0000 mg | ORAL_TABLET | Freq: Once | ORAL | Status: AC
  Administered 2018-08-17: 1000 mg via ORAL
  Filled 2018-08-17: qty 2

## 2018-08-17 MED ORDER — LIDOCAINE HCL (PF) 1 % IJ SOLN
30.0000 mL | Freq: Once | INTRAMUSCULAR | Status: AC
  Administered 2018-08-17: 30 mL via INTRADERMAL
  Filled 2018-08-17: qty 30

## 2018-08-17 MED ORDER — DOXYCYCLINE HYCLATE 100 MG PO TABS
100.0000 mg | ORAL_TABLET | Freq: Once | ORAL | Status: AC
  Administered 2018-08-17: 100 mg via ORAL
  Filled 2018-08-17: qty 1

## 2018-08-17 MED ORDER — DOXYCYCLINE HYCLATE 100 MG PO CAPS: 100.0000 mg | ORAL_CAPSULE | Freq: Two times a day (BID) | ORAL | 0 refills | Status: AC

## 2018-08-17 NOTE — ED Triage Notes (Signed)
Pt arrives with painful abcesses on legs, increasing in redness, swelling, and pain past two days.

## 2018-08-17 NOTE — ED Notes (Signed)
ED Provider at bedside. 

## 2018-08-17 NOTE — Discharge Instructions (Addendum)
You can take Tylenol or Ibuprofen as directed for pain. You can alternate Tylenol and Ibuprofen every 4 hours. If you take Tylenol at 1pm, then you can take Ibuprofen at 5pm. Then you can take Tylenol again at 9pm.   Take antibiotics as directed. Please take all of your antibiotics until finished.  Keep the wound clean and dry. Gently wash the wound with soap and water and make sure to pat it dry.   Followup with Emergency Department  in 2 days for wound recheck.   Return to the Emergency Department if you experienced any worsening/spreading redness or swelling, fever, worsening pain, or any other worsening or concerning symptoms.

## 2018-08-17 NOTE — ED Provider Notes (Signed)
[facility] EMERGENCY DEPARTMENT Provider Note   CSN: [id number] Arrival date & time: 08/17/18  1751     History   Chief Complaint No chief complaint on file.   HPI Cheryl Holt is a 28 y.o. female who presents for evaluation of pain, swelling, erythema noted to the bilateral lower extremities.  Patient reports that she noticed a few days ago some small red bumps noted to her bilateral lower legs.  He states over last 3 days, they have continuously worsened and gotten bigger.  Patient states that the areas are painful, more swollen.  She states she has not taken any medication for the pain.  Patient states that she has not noted any fevers.  She is still been able to ambulate without any difficulty.  Patient denies any IV drug use.  Patient does report that she was recently staying in a hotel with bedbugs and was bitten all over.  She does not know if the abscesses started initially from a bug bite that continue to worsen.  The history is provided by the patient.    Past Medical History:  Diagnosis Date  . Depression   . Gestational diabetes   . Headache   . Hepatitis C 05/11/2016   [ ]  f/u quant [ ]  cmp  . Infection    UTI  . IV drug abuse (HCC)   . Polysubstance abuse [facility])     Patient Active Problem List   Diagnosis Date Noted  . Uncomplicated opioid dependence (HCC)   . MDD (major depressive disorder), recurrent severe, without psychosis (HCC) 07/09/2018  . 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 Pregnancy complicated by subutex maintenance, antepartum (HCC) 09/18/2017  . Dichorionic diamniotic twin pregnancy in third trimester 09/18/2017  . Chronic viral hepatitis affecting pregnancy in third trimester (HCC) 09/18/2017  . Preterm labor 09/16/2017  . Left breast abscess 07/25/2017  . Supervision of high-risk pregnancy 05/24/2016  . Hepatitis C 05/11/2016  . Obesity in pregnancy 05/10/2016  . Rh negative status during pregnancy 04/04/2016    . History of gestational diabetes mellitus 03/27/2016  . Substance abuse complicating pregnancy in third trimester, antepartum 02/26/2016  . Anxiety and depression 02/26/2016  . Cannabis use disorder, moderate, dependence (HCC) 01/18/2016  . 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 Tobacco use disorder 01/18/2016  . Opioid use disorder, severe, in sustained remission (HCC) 01/18/2016  . 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 CESAREAN SECTION    . CHOLECYSTECTOMY, LAPAROSCOPIC  2014     OB History    Gravida  5   Para  2   Term  2   Preterm  0   AB  2   Living  2     SAB  0   TAB  2   Ectopic      Multiple      Live Births  1            Home Medications    Prior to Admission medications   Medication Sig Start Date End Date Taking? Authorizing Provider  diphenhydrAMINE (BENADRYL) 25 MG tablet Take 1 tablet (25 mg total) by mouth every 6 (six) hours. Patient not taking: Reported on 08/12/2018 07/18/18   Azalia Bilis, MD  doxycycline (VIBRAMYCIN) 100 MG capsule Take 1 capsule (100 mg total) by mouth 2 (two) times daily for 7 days. 08/17/18 08/24/18  Maxwell CaulLayden, Lindsey A, PA-C  FLUoxetine (PROZAC) 40 MG capsule Take 1 capsule (40 mg total) by mouth daily. 07/15/18   Malvin JohnsFarah, Brian, MD  gabapentin (NEURONTIN) 100 MG capsule Take 1 capsule (100 mg total) by mouth 3 (three) times daily. Patient not taking: Reported on 08/12/2018 07/15/18   Malvin JohnsFarah, Brian, MD  hydrOXYzine (ATARAX/VISTARIL) 25 MG tablet Take 1 tablet (25 mg total) by mouth 3 (three) times daily as needed for itching, anxiety or nausea. Patient not taking: Reported on 08/12/2018 07/15/18   Malvin JohnsFarah, Brian, MD  NIFEdipine (PROCARDIA XL) 30 MG 24 hr tablet Take 1 tablet (30 mg total) by mouth 2 (two) times daily as needed (contractions). Patient not taking: Reported on  08/12/2018 09/25/17   Judeth HornLawrence, Erin, NP  permethrin (ELIMITE) 5 % cream Apply to affected area once Patient not taking: Reported on 08/12/2018 07/18/18   Azalia Bilisampos, Kevin, MD  Prenatal Vit-Fe Fumarate-FA (PRENATAL MULTIVITAMIN) TABS tablet Take 1 tablet by mouth daily at 12 noon. 05/20/17   Devoria AlbeKnapp, Iva, MD    Family History Family History  Problem Relation Age of Onset  . Cancer Mother        pancreatic  . Migraines Mother   . Mental illness Father   . Epilepsy Father   . Migraines Father   . Hypertension Father     Social History Social History   Tobacco Use  . Smoking status: Current Every Day Smoker    Packs/day: 0.50    Years: 10.00    Pack years: 5.00    Types: Cigarettes  . Smokeless tobacco: Never Used  Substance Use Topics  . Alcohol use: No  . Drug use: Yes    Types: Marijuana, Cocaine, Methamphetamines    Comment: using $20 heroin daily /$60 cocaine q 2 weeks     Allergies   Nsaids   Review of Systems Review of Systems  Constitutional: Negative for fever.  Skin: Positive for color change and wound.     Physical Exam Updated Vital Signs BP 124/81   Pulse 79   Temp 98.3 F (36.8 C) (Oral)   Resp 16   Ht 5\' 5"  (1.651 m)   Wt 99.8 kg   LMP 07/18/2018   SpO2 100%   BMI 36.61 kg/m   Physical Exam Vitals signs and nursing note reviewed.  Constitutional:      Appearance: She is well-developed.  HENT:     Head: Normocephalic and atraumatic.  Eyes:     General: No scleral icterus.       Right eye: No discharge.        Left eye: No discharge.     Conjunctiva/sclera: Conjunctivae normal.  Pulmonary:     Effort: Pulmonary effort is normal.  Skin:    General: Skin is warm and dry.     Capillary Refill: Capillary refill takes less than 2 seconds.          Comments: Scattered, edematous bites noted to bilateral upper and lower extremities consistent with bedbug bites.  Neurological:     Mental Status: She is alert.  Psychiatric:        Speech:  Speech normal.        Behavior: Behavior normal.      ED Treatments / Results  Labs (all labs ordered are listed, but only abnormal results are displayed) Labs Reviewed  POC URINE PREG, ED    EKG None  Radiology No results found.  Procedures .Marland Kitchen.Incision and Drainage Date/Time: 08/17/2018 8:36 PM Performed by: Elson ClanLayden,  Early Chars, PA-C Authorized by: Maxwell Caul, PA-C   Consent:    Consent obtained:  Verbal   Consent given by:  Patient   Risks discussed:  Bleeding, incomplete drainage, pain and damage to other organs   Alternatives discussed:  No treatment Universal protocol:    Procedure explained and questions answered to patient or proxy's satisfaction: yes     Relevant documents present and verified: yes     Test results available and properly labeled: yes     Imaging studies available: yes     Required blood products, implants, devices, and special equipment available: yes     Site/side marked: yes     Immediately prior to procedure a time out was called: yes     Patient identity confirmed:  Verbally with patient Location:    Type:  Abscess   Size:  2   Location:  Lower extremity   Lower extremity location:  Leg   Leg location:  R lower leg Pre-procedure details:    Skin preparation:  Betadine Anesthesia (see MAR for exact dosages):    Anesthesia method:  Local infiltration   Local anesthetic:  Lidocaine 1% WITH epi and lidocaine 1% w/o epi Procedure type:    Complexity:  Complex Procedure details:    Incision types:  Single straight   Incision depth:  Subcutaneous   Scalpel blade:  11   Wound management:  Probed and deloculated, irrigated with saline and extensive cleaning   Drainage:  Purulent   Drainage amount:  Moderate   Wound treatment:  Wound left open Post-procedure details:    Patient tolerance of procedure:  Tolerated well, no immediate complications Comments:     Once wound was anesthetized, was incised.  There was an amount of purulent  drainage.  Wound explored through hemostats and area was deloculated.  It appeared superficial and did not extend deep and did not warrant any packing.  Marland Kitchen.Incision and Drainage Date/Time: 08/17/2018 8:38 PM Performed by: Maxwell Caul, PA-C Authorized by: Maxwell Caul, PA-C   Consent:    Consent obtained:  Verbal   Consent given by:  Patient   Risks discussed:  Bleeding, incomplete drainage, pain and damage to other organs   Alternatives discussed:  No treatment Universal protocol:    Procedure explained and questions answered to patient or proxy's satisfaction: yes     Relevant documents present and verified: yes     Test results available and properly labeled: yes     Imaging studies available: yes     Required blood products, implants, devices, and special equipment available: yes     Site/side marked: yes     Immediately prior to procedure a time out was called: yes     Patient identity confirmed:  Verbally with patient Location:    Type:  Abscess   Size:  3   Location:  Lower extremity   Lower extremity location:  Leg   Leg location:  L upper leg Pre-procedure details:    Skin preparation:  Betadine Anesthesia (see MAR for exact dosages):    Anesthesia method:  Local infiltration   Local anesthetic:  Lidocaine 1% WITH epi and lidocaine 1% w/o epi Procedure type:    Complexity:  Complex Procedure details:    Incision types:  Single straight   Incision depth:  Subcutaneous   Scalpel blade:  11   Wound management:  Probed and deloculated, irrigated with saline and extensive cleaning   Drainage:  Purulent  Drainage amount:  Copious Post-procedure details:    Patient tolerance of procedure:  Tolerated well, no immediate complications Comments:     Once the wound was anesthetized, it was incised with copious amounts of purulent drainage.  Using hemostats, the areas of loculation were broken up.  It did not extend deeply and was very superficial.  No warranting for  packing. Marland Kitchen.Incision and Drainage Date/Time: 08/17/2018 8:39 PM Performed by: Maxwell Caul, PA-C Authorized by: Maxwell Caul, PA-C   Consent:    Consent obtained:  Verbal   Consent given by:  Patient   Risks discussed:  Bleeding, incomplete drainage, pain and damage to other organs   Alternatives discussed:  No treatment Universal protocol:    Procedure explained and questions answered to patient or proxy's satisfaction: yes     Relevant documents present and verified: yes     Test results available and properly labeled: yes     Imaging studies available: yes     Required blood products, implants, devices, and special equipment available: yes     Site/side marked: yes     Immediately prior to procedure a time out was called: yes     Patient identity confirmed:  Verbally with patient Location:    Type:  Abscess   Location:  Lower extremity   Lower extremity location:  Leg   Leg location:  L lower leg Pre-procedure details:    Skin preparation:  Betadine Anesthesia (see MAR for exact dosages):    Anesthesia method:  Local infiltration   Local anesthetic:  Lidocaine 1% w/o epi Procedure type:    Complexity:  Complex Procedure details:    Incision types:  Single straight   Incision depth:  Subcutaneous   Scalpel blade:  11   Wound management:  Probed and deloculated, irrigated with saline and extensive cleaning   Drainage:  Purulent   Drainage amount:  Moderate Post-procedure details:    Patient tolerance of procedure:  Tolerated well, no immediate complications   (including critical care time)  Medications Ordered in ED Medications  lidocaine (PF) (XYLOCAINE) 1 % injection 30 mL (30 mLs Intradermal Given by Other 08/17/18 1824)  acetaminophen (TYLENOL) tablet 1,000 mg (1,000 mg Oral Given 08/17/18 2032)  doxycycline (VIBRA-TABS) tablet 100 mg (100 mg Oral Given 08/17/18 2038)     Initial Impression / Assessment and Plan / ED Course  I have reviewed the triage  vital signs and the nursing notes.  Pertinent labs & imaging results that were available during my care of the patient were reviewed by me and considered in my medical decision making (see chart for details).     28 year old female who presents for evaluation of abscesses to bilateral lower extremities.  She states that she was recently seen motel where she had bedbugs and states that she does not know if he bit her and got infected.  She also reports that she noticed after shaving, they seem to get worse.  No fevers.  He denies any IV drug use. Patient is afebril, non-toxic appearing, sitting comfortably on examination table. Vital signs reviewed and stable.  Exam, she has 3 areas of warmth, erythema, induration with central fluctuance noted to the anterior aspect of the right lower extremity, the upper left leg and the lower left leg.  Areas are superficial.  Patient exhibits no systemic signs of illness.  We will plan to I&D.  I&D as documented above.  Patient tolerated procedure well.  Will start patient on antibiotics.  Courage at home supportive care measures as well as proper wound care. At this time, patient exhibits no emergent life-threatening condition that require further evaluation in ED or admission. Patient had ample opportunity for questions and discussion. All patient's questions were answered with full understanding. Strict return precautions discussed. Patient expresses understanding and agreement to plan.    Portions of this note were generated with Scientist, clinical (histocompatibility and immunogenetics). Dictation errors may occur despite best attempts at proofreading.  Final Clinical Impressions(s) / ED Diagnoses   Final diagnoses:  Abscess    ED Discharge Orders         Ordered    doxycycline (VIBRAMYCIN) 100 MG capsule  2 times daily     08/17/18 2035           Rosana Hoes 08/17/18 2323    Margarita Grizzle, MD 08/21/18 1806

## 2018-08-26 ENCOUNTER — Emergency Department (HOSPITAL_COMMUNITY)
Admission: EM | Admit: 2018-08-26 | Discharge: 2018-08-26 | Disposition: A | Discharge status: Home / Self Care | Payer: Self-pay | Attending: Emergency Medicine | Admitting: Emergency Medicine

## 2018-08-26 ENCOUNTER — Other Ambulatory Visit: Payer: Self-pay

## 2018-08-26 ENCOUNTER — Encounter (HOSPITAL_COMMUNITY): Payer: Self-pay

## 2018-08-26 DIAGNOSIS — Z79899 Other long term (current) drug therapy: Secondary | ICD-10-CM | POA: Insufficient documentation

## 2018-08-26 DIAGNOSIS — F17210 Nicotine dependence, cigarettes, uncomplicated: Secondary | ICD-10-CM | POA: Insufficient documentation

## 2018-08-26 DIAGNOSIS — L03119 Cellulitis of unspecified part of limb: Secondary | ICD-10-CM

## 2018-08-26 DIAGNOSIS — F1910 Other psychoactive substance abuse, uncomplicated: Secondary | ICD-10-CM | POA: Insufficient documentation

## 2018-08-26 DIAGNOSIS — L03115 Cellulitis of right lower limb: Secondary | ICD-10-CM | POA: Insufficient documentation

## 2018-08-26 DIAGNOSIS — L03116 Cellulitis of left lower limb: Secondary | ICD-10-CM | POA: Insufficient documentation

## 2018-08-26 MED ORDER — MUPIROCIN CALCIUM 2 % EX CREA: 1.0000 "application " | TOPICAL_CREAM | Freq: Two times a day (BID) | CUTANEOUS | 0 refills | Status: AC

## 2018-08-26 MED ORDER — SULFAMETHOXAZOLE-TRIMETHOPRIM 800-160 MG PO TABS: 1.0000 | ORAL_TABLET | Freq: Two times a day (BID) | ORAL | 0 refills | Status: AC

## 2018-08-26 NOTE — ED Notes (Signed)
Bed: [facility]3 Expected date:  Expected time:  Means of arrival:  Comments: 

## 2018-08-26 NOTE — Discharge Instructions (Signed)
Please take all Bactrim as prescribed.  Clean wounds twice daily with water and soap and then apply mupirocin ointment.  Follow-up with [facility] for detox from your polysubstance abuse.  Return to the ED immediately for new or worsening symptoms, such as fevers, worsening wounds, vomiting or any concerns at all.

## 2018-08-26 NOTE — ED Provider Notes (Signed)
[facility]-EMERGENCY DEPT Provider Note   CSN: [id number] Arrival date & time: 08/26/18  1336     History   Chief Complaint Chief Complaint  Patient presents with  . detox  . Medical Clearance    HPI Cheryl Holt is a 28 y.o. female.  HPI   28 year old female, with a PMH of meth use, polysubstance abuse, presents requesting med clearance to go to [facility].  Patient states that she was diagnosed with scabies several weeks ago and she needs clearance.  She states she put all the cream on her hands and her symptoms have resolved.  She does know multiple places where she has picked scabs due to her meth use.  She also notes multiple places that she had I&D's that have some surrounding erythema.  She denies any fevers, chills, nausea, vomiting, domino pain.  She denies any chest pain, shortness of breath, abdominal pain.  Past Medical History:  Diagnosis Date  . Depression   . Gestational diabetes   . Headache   . Hepatitis C 05/11/2016   [ ]  f/u quant [ ]  cmp  . Infection    UTI  . IV drug abuse (HCC)   . Polysubstance abuse [facility])     Patient Active Problem List   Diagnosis Date Noted  . Uncomplicated opioid dependence (HCC)   . MDD (major depressive disorder), recurrent severe, without psychosis (HCC) 07/09/2018  . 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 Pregnancy complicated by subutex maintenance, antepartum (HCC) 09/18/2017  . Dichorionic diamniotic twin pregnancy in third trimester 09/18/2017  . Chronic viral hepatitis affecting pregnancy in third trimester (HCC) 09/18/2017  . Preterm labor 09/16/2017  . Left breast abscess 07/25/2017  . Supervision of high-risk pregnancy 05/24/2016  . Hepatitis C 05/11/2016  . Obesity in pregnancy 05/10/2016  . Rh negative status during pregnancy 04/04/2016  . History of gestational diabetes mellitus 03/27/2016  . Substance abuse complicating pregnancy in third trimester, antepartum 02/26/2016  .  Anxiety and depression 02/26/2016  . Cannabis use disorder, moderate, dependence (HCC) 01/18/2016  . 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 Tobacco use disorder 01/18/2016  . Opioid use disorder, severe, in sustained remission (HCC) 01/18/2016  . 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 CESAREAN SECTION    . CHOLECYSTECTOMY, LAPAROSCOPIC  2014     OB History    Gravida  5   Para  2   Term  2   Preterm  0   AB  2   Living  2     SAB  0   TAB  2   Ectopic      Multiple      Live Births  1            Home Medications    Prior to Admission medications   Medication Sig Start Date End Date Taking? Authorizing Provider  diphenhydrAMINE (BENADRYL) 25 MG tablet Take 1 tablet (25 mg total) by mouth every 6 (six) hours. Patient not taking: Reported on 08/12/2018 07/18/18   Azalia Bilis, MD  FLUoxetine (PROZAC) 40 MG capsule Take 1 capsule (40 mg total) by mouth daily. 07/15/18   Malvin Johns, MD  gabapentin (NEURONTIN) 100 MG capsule Take 1 capsule (100 mg total) by mouth 3 (three) times daily. Patient not taking: Reported on 08/12/2018 07/15/18   Malvin Johns, MD  hydrOXYzine (ATARAX/VISTARIL) 25 MG  tablet Take 1 tablet (25 mg total) by mouth 3 (three) times daily as needed for itching, anxiety or nausea. Patient not taking: Reported on 08/12/2018 07/15/18   Malvin JohnsFarah, Brian, MD  NIFEdipine (PROCARDIA XL) 30 MG 24 hr tablet Take 1 tablet (30 mg total) by mouth 2 (two) times daily as needed (contractions). Patient not taking: Reported on 08/12/2018 09/25/17   Judeth HornLawrence, Erin, NP  permethrin (ELIMITE) 5 % cream Apply to affected area once Patient not taking: Reported on 08/12/2018 07/18/18   Azalia Bilisampos, Kevin, MD  Prenatal Vit-Fe Fumarate-FA (PRENATAL MULTIVITAMIN) TABS tablet Take 1 tablet by mouth daily at 12 noon. 05/20/17   Devoria AlbeKnapp, Iva, MD      Family History Family History  Problem Relation Age of Onset  . Cancer Mother        pancreatic  . Migraines Mother   . Mental illness Father   . Epilepsy Father   . Migraines Father   . Hypertension Father     Social History Social History   Tobacco Use  . Smoking status: Current Every Day Smoker    Packs/day: 0.50    Years: 10.00    Pack years: 5.00    Types: Cigarettes  . Smokeless tobacco: Never Used  Substance Use Topics  . Alcohol use: No  . Drug use: Yes    Types: Marijuana, Cocaine, Methamphetamines    Comment: using $20 heroin daily /$60 cocaine q 2 weeks     Allergies   Nsaids   Review of Systems Review of Systems  Constitutional: Negative for chills and fever.  HENT: Negative for ear pain and rhinorrhea.   Respiratory: Negative for shortness of breath.   Cardiovascular: Negative for chest pain.  Gastrointestinal: Negative for abdominal pain, nausea and vomiting.  Skin: Positive for wound.     Physical Exam Updated Vital Signs BP (!) 142/59 (BP Location: Left Arm)   Pulse (!) 107   Temp 98.2 F (36.8 C) (Oral)   Resp 16   Ht 5\' 5"  (1.651 m)   Wt 86.2 kg   LMP 08/12/2018   SpO2 97%   BMI 31.62 kg/m   Physical Exam Vitals signs and nursing note reviewed.  Constitutional:      Appearance: She is well-developed.  HENT:     Head: Normocephalic and atraumatic.  Eyes:     Conjunctiva/sclera: Conjunctivae normal.  Neck:     Musculoskeletal: Neck supple.  Cardiovascular:     Rate and Rhythm: Normal rate and regular rhythm.     Heart sounds: Normal heart sounds. No murmur.  Pulmonary:     Effort: Pulmonary effort is normal. No respiratory distress.     Breath sounds: Normal breath sounds. No wheezing or rales.  Abdominal:     General: Bowel sounds are normal. There is no distension.     Palpations: Abdomen is soft.     Tenderness: There is no abdominal tenderness.  Musculoskeletal: Normal range of motion.        General: No  tenderness or deformity.  Skin:    General: Skin is warm and dry.     Findings: Wound (multiple lesions) present. No erythema.          Comments: Bilateral hands patient has multiple lesions which she has scratched, consistent with picking wounds.  She states this is due to her meth use.  Neurological:     Mental Status: She is alert and oriented to person, place, and time.  Psychiatric:  Behavior: Behavior normal.      ED Treatments / Results  Labs (all labs ordered are listed, but only abnormal results are displayed) Labs Reviewed - No data to display  EKG None  Radiology No results found.  Procedures Procedures (including critical care time)  Medications Ordered in ED Medications - No data to display   Initial Impression / Assessment and Plan / ED Course  I have reviewed the triage vital signs and the nursing notes.  Pertinent labs & imaging results that were available during my care of the patient were reviewed by me and considered in my medical decision making (see chart for details).     Patient presents requesting my clearance for a detox facility.  She was diagnosed with scabies and they are requiring her to be cleared prior to return to the facility.  On physical exam she has multiple places where she has picked scabs on her hands.  There is no evidence of any lesions between the fingers or on the wrists, no burrows, no erythema.  She has no itching.  Believe patient has scabies currently.  Regarding her multiple lesions on her legs, there is some surrounding cellulitis.  Will write for mupirocin cream for her multiple skin picked areas and for an antibiotic for the cellulitis.  Patient agreeable with plan.  She is resting comfortably in bed, no acute distress, nontoxic, non-lethargic.  Her rash has no evidence of petechiae, purpura, pustules.  She is ready and stable for discharge.   At this time there does not appear to be any evidence of an acute emergency  medical condition and the patient appears stable for discharge with appropriate outpatient follow up.Diagnosis was discussed with patient who verbalizes understanding and is agreeable to discharge.   Final Clinical Impressions(s) / ED Diagnoses   Final diagnoses:  None    ED Discharge Orders    None       Rueben Bash 08/26/18 2109    Wynetta Fines, MD 08/30/18 [phone]

## 2018-08-26 NOTE — Patient Outreach (Signed)
[facility] met with the patient to provide substance use recovery support and help with substance use treatment resources. Patient has been in contact with [facility] for their substance use treatment program. Myrlene Broker, the intake coordinator at [facility], states the patient needs to be 2 weeks cleared from scabies treatment in order to receive residential substance use treatment at [facility]. Patient was last treated for scabies on 08/12/18 in the [facility]. Patient claims she has been scabies symptom free since previous discharge from [facility]. Patient currently has substance use recovery supports from her church who are helping the patient with [facility] substance use treatment placement as well.

## 2018-08-26 NOTE — Patient Outreach (Signed)
[facility] and will need to arrive there at 10:30 p.m. tonight 08/26/18. Patient's church support group will take the patient after discharge to the [facility] at 10[facility]30[facility].

## 2018-08-26 NOTE — ED Triage Notes (Addendum)
Patient is requesting detox from opiates. . Patient states she used Amphetamines, cocaine, marijuana opiates, and meth within the past 5 days. Patient arrived with church members and both say that patient has been talking to [facility]. Patient states she needs to be medically cleared so she can go to [employer] called patient's visitors and stated that Peer support would be a liason between the patient and ED to get her into [facility]

## 2020-03-23 DEATH — deceased
